# Patient Record
Sex: Male | Born: 1960
Health system: Southern US, Community
[De-identification: ages and names within clinical notes are randomized; demographics above are authoritative.]

## PROBLEM LIST (undated history)

## (undated) DIAGNOSIS — N200 Calculus of kidney: Secondary | ICD-10-CM

## (undated) DIAGNOSIS — S83289A Other tear of lateral meniscus, current injury, unspecified knee, initial encounter: Secondary | ICD-10-CM

## (undated) DIAGNOSIS — S83249A Other tear of medial meniscus, current injury, unspecified knee, initial encounter: Secondary | ICD-10-CM

## (undated) DIAGNOSIS — I10 Essential (primary) hypertension: Secondary | ICD-10-CM

## (undated) DIAGNOSIS — G4733 Obstructive sleep apnea (adult) (pediatric): Secondary | ICD-10-CM

## (undated) DIAGNOSIS — M1990 Unspecified osteoarthritis, unspecified site: Secondary | ICD-10-CM

## (undated) DIAGNOSIS — C61 Malignant neoplasm of prostate: Secondary | ICD-10-CM

## (undated) DIAGNOSIS — J449 Chronic obstructive pulmonary disease, unspecified: Secondary | ICD-10-CM

## (undated) DIAGNOSIS — R0602 Shortness of breath: Secondary | ICD-10-CM

## (undated) DIAGNOSIS — E041 Nontoxic single thyroid nodule: Secondary | ICD-10-CM

## (undated) DIAGNOSIS — J45909 Unspecified asthma, uncomplicated: Secondary | ICD-10-CM

## (undated) DIAGNOSIS — K219 Gastro-esophageal reflux disease without esophagitis: Secondary | ICD-10-CM

## (undated) HISTORY — DX: Other tear of medial meniscus, current injury, unspecified knee, initial encounter: S83.249A

## (undated) HISTORY — PX: BACK SURGERY: SHX140

## (undated) HISTORY — DX: Essential (primary) hypertension: I10

## (undated) HISTORY — DX: Other tear of lateral meniscus, current injury, unspecified knee, initial encounter: S83.289A

## (undated) HISTORY — DX: Unspecified asthma, uncomplicated: J45.909

---

## 1988-11-01 HISTORY — PX: LUMBAR DISC SURGERY: SHX700

## 2000-10-12 ENCOUNTER — Ambulatory Visit (HOSPITAL_COMMUNITY): Admission: RE | Admit: 2000-10-12 | Discharge: 2000-10-12 | Payer: Self-pay | Admitting: Pulmonary Disease

## 2001-05-31 ENCOUNTER — Encounter: Payer: Self-pay | Admitting: Otolaryngology

## 2001-05-31 ENCOUNTER — Ambulatory Visit (HOSPITAL_COMMUNITY): Admission: RE | Admit: 2001-05-31 | Discharge: 2001-05-31 | Payer: Self-pay | Admitting: Otolaryngology

## 2001-07-19 ENCOUNTER — Ambulatory Visit (HOSPITAL_COMMUNITY): Admission: RE | Admit: 2001-07-19 | Discharge: 2001-07-19 | Payer: Self-pay | Admitting: Neurology

## 2001-07-19 ENCOUNTER — Encounter: Payer: Self-pay | Admitting: Neurology

## 2004-03-03 HISTORY — PX: ROBOT ASSISTED LAPAROSCOPIC RADICAL PROSTATECTOMY: SHX5141

## 2004-09-20 ENCOUNTER — Ambulatory Visit: Admission: RE | Admit: 2004-09-20 | Discharge: 2004-11-19 | Payer: Self-pay | Admitting: Radiation Oncology

## 2004-12-20 ENCOUNTER — Encounter (INDEPENDENT_AMBULATORY_CARE_PROVIDER_SITE_OTHER): Payer: Self-pay | Admitting: Specialist

## 2004-12-20 ENCOUNTER — Inpatient Hospital Stay (HOSPITAL_COMMUNITY): Admission: RE | Admit: 2004-12-20 | Discharge: 2004-12-23 | Payer: Self-pay | Admitting: Urology

## 2008-10-25 ENCOUNTER — Ambulatory Visit (HOSPITAL_COMMUNITY): Admission: RE | Admit: 2008-10-25 | Discharge: 2008-10-25 | Payer: Self-pay | Admitting: Internal Medicine

## 2009-01-01 HISTORY — PX: NASAL SINUS SURGERY: SHX719

## 2009-01-15 ENCOUNTER — Ambulatory Visit (HOSPITAL_COMMUNITY): Admission: RE | Admit: 2009-01-15 | Discharge: 2009-01-15 | Payer: Self-pay | Admitting: Otolaryngology

## 2009-01-15 ENCOUNTER — Encounter (INDEPENDENT_AMBULATORY_CARE_PROVIDER_SITE_OTHER): Payer: Self-pay | Admitting: Otolaryngology

## 2009-09-19 ENCOUNTER — Ambulatory Visit (HOSPITAL_COMMUNITY): Admission: RE | Admit: 2009-09-19 | Discharge: 2009-09-19 | Payer: Self-pay | Admitting: Internal Medicine

## 2009-09-25 DIAGNOSIS — I10 Essential (primary) hypertension: Secondary | ICD-10-CM | POA: Insufficient documentation

## 2009-09-26 ENCOUNTER — Ambulatory Visit: Payer: Self-pay | Admitting: Critical Care Medicine

## 2009-09-26 DIAGNOSIS — R0602 Shortness of breath: Secondary | ICD-10-CM | POA: Insufficient documentation

## 2009-09-26 DIAGNOSIS — G4710 Hypersomnia, unspecified: Secondary | ICD-10-CM | POA: Insufficient documentation

## 2009-09-26 DIAGNOSIS — Z8546 Personal history of malignant neoplasm of prostate: Secondary | ICD-10-CM | POA: Insufficient documentation

## 2009-10-31 ENCOUNTER — Ambulatory Visit (HOSPITAL_BASED_OUTPATIENT_CLINIC_OR_DEPARTMENT_OTHER): Admission: RE | Admit: 2009-10-31 | Discharge: 2009-10-31 | Payer: Self-pay | Admitting: Critical Care Medicine

## 2009-10-31 ENCOUNTER — Encounter: Payer: Self-pay | Admitting: Critical Care Medicine

## 2009-11-12 ENCOUNTER — Ambulatory Visit: Payer: Self-pay | Admitting: Pulmonary Disease

## 2009-11-28 ENCOUNTER — Ambulatory Visit: Payer: Self-pay | Admitting: Pulmonary Disease

## 2009-11-29 DIAGNOSIS — G4730 Sleep apnea, unspecified: Secondary | ICD-10-CM | POA: Insufficient documentation

## 2009-12-15 ENCOUNTER — Encounter: Payer: Self-pay | Admitting: Pulmonary Disease

## 2009-12-28 ENCOUNTER — Ambulatory Visit: Payer: Self-pay | Admitting: Pulmonary Disease

## 2010-01-02 ENCOUNTER — Encounter: Payer: Self-pay | Admitting: Pulmonary Disease

## 2010-01-27 ENCOUNTER — Encounter: Payer: Self-pay | Admitting: Pulmonary Disease

## 2010-03-08 ENCOUNTER — Ambulatory Visit
Admission: RE | Admit: 2010-03-08 | Discharge: 2010-03-08 | Payer: Self-pay | Source: Home / Self Care | Attending: Pulmonary Disease | Admitting: Pulmonary Disease

## 2010-04-04 NOTE — Assessment & Plan Note (Signed)
Summary: Pulmonary Consultation   Copy to:  Dr. Carylon Perches Primary Provider/Referring Provider:  Dr. Carylon Perches  CC:  Pulmonary Consult for SOB.   and dyspnea.  History of Present Illness: Pulmonary Consultation       This is a 50 year old male who presents with dyspnea.  The patient complains of shortness of breath, cough, mucous production, and nocturnal awakening, but denies history of diagnosed COPD, chest tightness, chest pain worse with breathing and coughing, wheezing, exercise induced symptoms, and congestion.  The cough is described as insignificant, perceived to be from upper airway, does not interrupt sleep, and does not occur after eating.  The dyspnea is described as dyspnea at rest, dyspnea with exertion, worse with inspiration, and occurs while bending over.  The patient reports a history of cancer:.  The patient denies any history of asthma, allergic rhinitis, COPD, sleep disordered breathing, obstructive sleep apnea, heart disease, thyroid disease, anemia, collagen vascular disease, osteporosis, kyphoscoliosis, occupational exposure: , and cancer: .    This pt is referred for eval for dyspnea.   The symptoms started around Feb of 2011.  The pt was  on lisinopril. PCP   ? if was ACE inhibitor side effect.   ACE inhibitor was stopped and pt is still dyspneic and cxr and exam not revealing cause.   PT referred for further eval. Only issue is dyspnea, will cough in early am and gets better through day.  No nocturnal  coughing.  The pt will awaken from sleep dyspneic,   ? sleep study is in order.   Labs show BNP normal.   Ddimer is normal.    Preventive Screening-Counseling & Management  Alcohol-Tobacco     Smoking Status: quit > 6 months     Year Quit: 1995     Pack years: 30  Pulmonary Function Test Date: 09/26/2009 Height (in.): 69 Gender: Male  Pre-Spirometry FVC    Value: 4.54 L/min   Pred: 4.75 L/min     % Pred: 96 % FEV1    Value: 3.27 L     Pred: 3.52 L     % Pred:  93 % FEV1/FVC  Value: 72 %     Pred: 74 %    FEF 25-75  Value: 2.42 L/min   Pred: 3.55 L/min     % Pred: 68 %    What time do you typically go to bed?(between what hours): 11pm  How long does it take you to fall asleep? Will go to sleep immediately  How many times during the night do you wake up? 4-5 times per night  What time do you get out of bed to start your day? 6-7 am  Do you drive or operate heavy machinery in your occupation? unemployed  How much has your weight changed (up or down) over the past two years? (in pounds): 10#  Have you ever had a sleep study before?  If yes,when and where: no  Do you currently use CPAP ? If so , at what pressure? no  Do you wear oxygen at any time? If yes, how many liters per minute? no Current Medications (verified): 1)  Losartan Potassium 50 Mg Tabs (Losartan Potassium) .Marland Kitchen.. 1 By Mouth Daily 2)  Naprosyn 500 Mg Tabs (Naproxen) .... One By Mouth Daily As Needed  Allergies (verified): No Known Drug Allergies  Past History:  Past medical, surgical, family and social histories (including risk factors) reviewed, and no changes noted (except as noted below).  Past  Medical History: PROSTATE CANCER, HX OF (ICD-V10.46) HYPERTENSION (ICD-401.9)  Past Surgical History: sinus surgery 01/2009 prostate surgery - 2006 back surgery 1990's  Family History: Reviewed history and no changes required. heart attack-father anuerysm-mother  Social History: Reviewed history and no changes required. Patient states former smoker.  Smoked 15 yrs.  Up to 2 1/2 - 3ppd when he stopped smoking.  Quit in 1995. 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  The patient denies coughing up blood, irregular heartbeats, acid heartburn, indigestion, loss of appetite, abdominal pain, sore throat, tooth/dental problems, headaches, itching, ear ache, anxiety, hand/feet swelling, change in color of mucus, and fever.        See HPI for Pulmonary, General, Cardiac, and ENT review of systems.  Vital Signs:  Patient profile:   50 year old male Height:      69 inches Weight:      261.38 pounds BMI:     38.74 O2 Sat:      97 % on Room air Temp:     98.4 degrees F oral Pulse rate:   96 / minute BP sitting:   160 / 84  (left arm) Cuff size:   large  Vitals Entered By: Gweneth Dimitri RN (September 26, 2009 3:14 PM)  O2 Flow:  Room air CC: Pulmonary Consult for SOB.  , dyspnea Comments Medications reviewed with patient Daytime contact number verified with patient. Gweneth Dimitri RN  September 26, 2009 3:14 PM    Physical Exam  Additional Exam:  Gen: Pleasant, well-nourished, in no distress,  normal affect ENT: No lesions,  mouth clear,  oropharynx clear, no postnasal drip Neck: No JVD, no TMG, no carotid bruits Lungs: No use of accessory muscles, no dullness to percussion, clear without rales or rhonchi, distant BS Cardiovascular: RRR, heart sounds normal, no murmur or gallops, no peripheral edema Abdomen: soft and NT, no HSM,  BS normal Musculoskeletal: No deformities, no cyanosis or clubbing Neuro: alert, non focal Skin: Warm, no lesions or rashes    CXR  Procedure date:  09/19/2009  Findings:      persistent bronchitic changes, else normal study  Pulmonary Function Test Date: 09/26/2009 Height (in.): 69 Gender: Male  Pre-Spirometry FVC    Value: 4.54 L/min   Pred: 4.75 L/min     % Pred: 96 % FEV1    Value: 3.27 L     Pred: 3.52 L     % Pred: 93 % FEV1/FVC  Value: 72 %     Pred: 74 %    FEF 25-75  Value: 2.42 L/min   Pred: 3.55  L/min     % Pred: 68 %  Impression & Recommendations:  Problem # 1:  DYSPNEA (ICD-786.05) Assessment Unchanged Dyspnea ? etiology,  spiro shows obstruction in peripheral airways.  ? asthma ? hyperreactive airways  ? sleep apnea component, weight gain is an issue, also needs reflux diet to control acid into upper airway and promote weight loss  plan Start Asmanex two puff daily Use ventolin hfa one - two puffs every 4 hours as needed A sleep study will be obtained Need to  focus on weight loss, see reflux diet sheet for this Return in one month for recheck Orders: Spirometry w/Graph (94010) New Patient Level V (04540)  Medications Added to Medication List This Visit: 1)  Niaspan 500 Mg Cr-tabs (Niacin (antihyperlipidemic)) .... As needed 2)  Naprosyn 500 Mg Tabs (Naproxen) .... One by mouth daily as needed 3)  Asmanex 120 Metered Doses 220 Mcg/inh Aepb (Mometasone furoate) .... Two puffs nightly 4)  Ventolin Hfa 108 (90 Base) Mcg/act Aers (Albuterol sulfate) .... One to two puffs every 4 hours as needed  Complete Medication List: 1)  Losartan Potassium 50 Mg Tabs (Losartan potassium) .Marland Kitchen.. 1 by mouth daily 2)  Naprosyn 500 Mg Tabs (Naproxen) .... One by mouth daily as needed 3)  Asmanex 120 Metered Doses 220 Mcg/inh Aepb (Mometasone furoate) .... Two puffs nightly 4)  Ventolin Hfa 108 (90 Base) Mcg/act Aers (Albuterol sulfate) .... One to two puffs every 4 hours as needed  Other Orders: Sleep Disorder Referral (Sleep Disorder) Sleep Disorder Referral (Sleep Disorder)  Patient Instructions: 1)  Start Asmanex two puff daily 2)  Use ventolin hfa one - two puffs every 4 hours as needed 3)  A sleep study will be obtained 4)  Need to focus on weight loss, see reflux diet sheet for this 5)  Return in one month for recheck Prescriptions: VENTOLIN HFA 108 (90 BASE) MCG/ACT AERS (ALBUTEROL SULFATE) one to two puffs every 4 hours as needed  #1 x 6   Entered and Authorized by:   Storm Frisk MD   Signed by:   Storm Frisk MD on 09/26/2009   Method used:   Electronically to        [facility] [address] [phone]* (retail)       [address]       [place], [place]  [address]       Ph: [phone] or [phone]       Fax: [phone]   RxID:   [id number] ASMANEX 120 METERED DOSES 220 MCG/INH  AEPB (MOMETASONE FUROATE) two puffs nightly  #1 x 6   Entered and Authorized by:   Storm Frisk MD   Signed by:   Storm Frisk MD on 09/26/2009   Method used:   [facility] to        [facility] [phone][facility] (retail)       [address]       [place], [place]  [address]       Ph: [phone] or [phone]       Fax: [phone]   RxID:   [id number]      CardioPerfect Spirometry  ID: [id number] Patient: Thomas [name] A DOB: 1960-08-25 Age: 50 Years Old Sex: Male Race: White Physician: Dr. Delford Field Height: 69 Weight: 261.38 Past Medical History:  Current Problems:  HYPERTENSION (ICD-401.9)  Recorded: 09/26/2009  Parameter  Measured Predicted %Predicted FVC     4.54        96 FEV1     3.27        3.52        93 FEV1%   72                 PEF                      Interpretation:   Appended Document: Pulmonary Consultation fax Carylon Perches  Appended Document: Pulmonary Consultation pl make post sleep study FU OV with me   Appended Document: Pulmonary Consultation LMOMTCB x 1 and schedule an appointment. jwr  Appended Document: Pulmonary Consultation pt scheduled appointment.jwr

## 2010-04-04 NOTE — Assessment & Plan Note (Signed)
Summary: 3 mo follow-up/LC   Visit Type:  Follow-up Copy to:  Dr. Carylon Perches Primary Provider/Referring Provider:  Dr. Carylon Perches  CC:  Pt here for 3 month follow up on OSA.  History of Present Illness: Pulmonary Consultation 09/26/05  49/M, ex smoker, post sinus surgery with persistent dyspnea, near nml pfts. Labs show BNP normal.   Ddimer is normal.  November 28, 2009 12:12 PM SLEEP CONSULT -smoker,  referred for evaluation of obstructive sleep apnea , his chief complaint is dyspnea, nml spirometry Epworth Sleepiness Score 8/24  Reviewed pSG on 10/31/09, BMI 37 ,wt 250 >> poor sleep efficiency with TST only 2 h, AHI 26/h, RDI 36/h, lowest desatn 88%, desensitized with a medium full face mask  December 28, 2009 11:16 AM  reviewed download 10/3-15/11 >> poor compliance 2.5 hrs , avg pr 12 cm, AHI 2.5/h - good ocntrol of events. he changed to full face mask, has paranasal leak Some improvement in tiredness, more energy  March 08, 2010 3:16 PM  download 10/28-11/27/11 >> excellent compliance, no residual events cpap helps with tiredness & dyspnea but main problem is positional neck pain , c/o slipped disc in neck,sleeps on his stomach. He is a mouth breather & does not want to try nasal mask.    Preventive Screening-Counseling & Management  Alcohol-Tobacco     Alcohol drinks/day: 1     Alcohol type: beer     Smoking Status: quit > 6 months     Packs/Day: 0.5     Year Started: 1976     Year Quit: 1995     Pack years: 30  Current Medications (verified): 1)  Naprosyn 500 Mg Tabs (Naproxen) .... One By Mouth Daily As Needed 2)  Asmanex 120 Metered Doses 220 Mcg/inh  Aepb (Mometasone Furoate) .... Two Puffs Nightly As Needed 3)  Ventolin Hfa 108 (90 Base) Mcg/act Aers (Albuterol Sulfate) .... One To Two Puffs Every 4 Hours As Needed 4)  Benicar 20 Mg Tabs (Olmesartan Medoxomil) .... Take 1 Tablet By Mouth Once A Day  Allergies (verified): No Known Drug Allergies  Past  History:  Past Medical History: Last updated: 09/26/2009 PROSTATE CANCER, HX OF (ICD-V10.46) HYPERTENSION (ICD-401.9)  Social History: Last updated: 09/26/2009 Patient states former smoker.  Smoked 15 yrs.  Up to 2 1/2 - 3ppd when he stopped smoking.  Quit in 1995. 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    Vital Signs:  Patient profile:   50 year old male Height:      69 inches Weight:      263 pounds BMI:     38.98 O2 Sat:      99 % on Room air Temp:     98.7 degrees F oral Pulse rate:   94 / minute BP sitting:   122 / 82  (left arm) Cuff size:   large  Vitals Entered By: Zackery Barefoot CMA (March 08, 2010 2:48 PM)  O2 Flow:  Room air CC: Pt here for 3 month follow up on OSA Comments Medications reviewed with patient Verified contact number and pharmacy with patient Shanda Bumps  [facility]  March 08, 2010 2:49 PM    Physical Exam  Additional Exam:  wt 262 December 28, 2009 263 March 08, 2010  Gen: Pleasant, well-nourished, in no distress,  normal affect ENT: No lesions,  mouth clear,  oropharynx clear, no postnasal drip, class 3 airway Neck: No JVD, no TMG, no carotid bruits Lungs: No use of accessory muscles, no dullness to percussion, clear without rales or rhonchi, distant BS Cardiovascular: RRR, heart sounds normal, no murmur or gallops, no peripheral edema Musculoskeletal: No deformities, no cyanosis or clubbing Skin: Warm, no  lesions or rashes    Impression & Recommendations:  Problem # 1:  OBSTRUCTIVE SLEEP APNEA (ICD-780.57)  We discussed his positional problems in some detail Options include - conformational pillow and / or nasal interface -mask or pillows He will tryt his sequentially & see benefit. Otherwise he has experienced the benefits of cpap so is agreeable to be complaint. Compliance encouraged, wt loss emphasized, asked to avoid meds with sedative side effects, cautioned against driving when sleepy.  Finally, if above does not work, consider oral appliance?  Orders: Est. Patient Level III (13244)  Patient Instructions: 1)  Copy sent to: dr fagan 2)  Please schedule a follow-up appointment in 6 months. 3)  Options for your problems include: 4)  conformational pillow 5)  Nasal pillows

## 2010-04-04 NOTE — Assessment & Plan Note (Signed)
Summary: 1 month/ mbw   Visit Type:  Follow-up Copy to:  Dr. Carylon Perches Primary Daron Breeding/Referring Wen [place]:  Dr. Carylon Perches  CC:  1 month follow up.  History of Present Illness: Pulmonary Consultation 09/26/05  50/M, ex smoker, post sinus surgery with persistent dyspnea, near nml pfts. Labs show BNP normal.   Ddimer is normal.  November 28, 2009 12:12 PM SLEEP CONSULT -smoker,  referred for evaluation of obstructive sleep apnea , his chief complaint is dyspnea, nml spirometry Epworth Sleepiness Score 8/24 Bdetime 11 pm, latency 10-90 mins due to back & neck pain on stomach x 1 pillow, 4-5 awakenings, oob at 0700, dry mouth, tired, wife noted loud snoring,  c/o witnessed apneas, no daytime naps,  Reviewed pSG on 10/31/09, BMI 37 ,wt 250 >> poor sleep efficiency with TST only 2 h, AHI 26/h, RDI 36/h, lowest desatn 88%, desensitized with a medium full face mask  December 28, 2009 11:16 AM  reviewed download 10/3-15/11 >> poor compliance 2.5 hrs , avg pr 12 cm, AHI 2.5/h - good ocntrol of events. he changed to full face mask, has paranasal leak Some improvement in tiredness, more energy   Preventive Screening-Counseling & Management  Alcohol-Tobacco     Alcohol drinks/day: 1     Alcohol type: beer     Smoking Status: quit > 6 months     Packs/Day: 0.5     Year Started: 1976     Year Quit: 1995     Pack years: 30  Current Medications (verified): 1)  Naprosyn 500 Mg Tabs (Naproxen) .... One By Mouth Daily As Needed 2)  Asmanex 120 Metered Doses 220 Mcg/inh  Aepb (Mometasone Furoate) .... Two Puffs Nightly As Needed 3)  Ventolin Hfa 108 (90 Base) Mcg/act Aers (Albuterol Sulfate) .... One To Two Puffs Every 4 Hours As Needed 4)  Benicar 20 Mg Tabs (Olmesartan Medoxomil) .... Take 1 Tablet By Mouth Once A Day  Allergies (verified): No Known Drug Allergies  Past History:  Past Medical History: Last updated: 09/26/2009 PROSTATE CANCER, HX OF (ICD-V10.46) HYPERTENSION  (ICD-401.9)  Social History: Last updated: 09/26/2009 Patient states former smoker.  Smoked 15 yrs.  Up to 2 1/2 - 3ppd when he stopped smoking.  Quit in 1995. 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    Vital Signs:  Patient profile:   50 year old male Height:      69 inches Weight:      262.0 pounds BMI:     38.83 O2 Sat:      95 % on Room air Temp:     98.6 degrees F oral Pulse rate:   86 / minute BP sitting:   100 / 70  (left arm) Cuff size:   large  Vitals Entered By: Zackery Barefoot CMA (December 28, 2009 10:58 AM)  O2 Flow:  Room air CC: 1 month follow up Comments Medications reviewed with patient Verified contact number and pharmacy with patient Zackery Barefoot [facility]  December 28, 2009 10:58 AM    Physical Exam  Additional Exam:  wt 262 December 28, 2009  Gen: Pleasant, well-nourished, in no distress,  normal affect ENT: No lesions,  mouth clear,  oropharynx clear, no postnasal drip, class 3 airway Neck: No JVD, no TMG, no carotid bruits Lungs: No use of accessory muscles, no dullness to percussion, clear without rales or rhonchi, distant BS Cardiovascular: RRR, heart sounds normal, no murmur or gallops, no peripheral edema Musculoskeletal: No deformities, no cyanosis or clubbing Skin: Warm, no lesions or rashes    Impression & Recommendations:  Problem # 1:  OBSTRUCTIVE SLEEP APNEA (ICD-780.57) The pathophysiology of obstructive sleep apnea, it's  cardiovascular consequences and modes of treatment including CPAP were discussed with the patient in great detail.  Compliance encouraged, wt loss emphasized, asked to avoid meds with sedative side effects, cautioned against driving when sleepy. change to cpap 12 cm, rechk download in 4 wks Orders: Est. Patient Level III (16109) DME Referral (DME)  Medications Added to Medication List This Visit: 1)  Benicar 20 Mg Tabs (Olmesartan medoxomil) .... Take 1 tablet by mouth once a day  Patient Instructions: 1)  Copy sent to: dr fagan 2)  Please schedule a follow-up appointment in 3 months. 3)  I will change to pressure of 12 cm - download in 4 weeks    Not Administered:    Influenza Vaccine not given due to: declined   Appended Document: 1 month/ mbw download 10/28-11/27/11 >> excellent compliance, no residual events  Appended Document: 1 month/ mbw lmomtcb x 1.   Appended Document: 1 month/ mbw Pt is aware of cpap compliance and scheduled 3 month follow-up for 03/08/2010 w/ RA.

## 2010-04-04 NOTE — Assessment & Plan Note (Signed)
Summary: post sleep study f/u ov per RA/apc   Visit Type:  Follow-up Copy to:  Dr. Carylon Perches Primary Provider/Referring Provider:  Dr. Carylon Perches  CC:  Pt here for sleep follow up.  History of Present Illness: Pulmonary Consultation 09/26/05   This pt is referred for eval for dyspnea.   The symptoms started around Feb of 2011.  The pt was  on lisinopril. PCP   ? if was ACE inhibitor side effect.   ACE inhibitor was stopped and pt is still dyspneic and cxr and exam not revealing cause.   PT referred for further eval. Only issue is dyspnea, will cough in early am and gets better through day.  No nocturnal  coughing.  The pt will awaken from sleep dyspneic,   ? sleep study is in order.   Labs show BNP normal.   Ddimer is normal.  November 28, 2009 12:12 PM SLEEP CONSULT -smoker,  referred for evaluation of obstructive sleep apnea , his chief complaint is dyspnea, nml spirometry Epworth Sleepiness Score 8/24 Bdetime 11 pm, latency 10-90 mins due to back & neck pain on stomach x 1 pillow, 4-5 awakenings, oob at 0700, dry mouth, tired, wife noted loud snoring,  c/o witnessed apneas, no daytime naps,  There is no history suggestive of cataplexy, sleep paralysis or parasomnias  Reviewed pSG on 10/31/09, BMI 37 ,wt 250 >> poor sleep efficiency with TST only 2 h, AHI 26/h, RDI 36/h, lowest desatn 88%, desensitized with a medium full face mask   Preventive Screening-Counseling & Management  Alcohol-Tobacco     Alcohol drinks/day: 1     Alcohol type: beer     Smoking Status: quit > 6 months     Packs/Day: 0.5     Year Started: 1976     Year Quit: 1995     Pack years: 30   Current Medications (verified): 1)  Naprosyn 500 Mg Tabs (Naproxen) .... One By Mouth Daily As Needed 2)  Asmanex 120 Metered Doses 220 Mcg/inh  Aepb (Mometasone Furoate) .... Two Puffs Nightly As Needed 3)  Ventolin Hfa 108 (90 Base) Mcg/act Aers (Albuterol Sulfate) .... One To Two Puffs Every 4 Hours As  Needed  Allergies (verified): No Known Drug Allergies  Past History:  Past Medical History: Last updated: 09/26/2009 PROSTATE CANCER, HX OF (ICD-V10.46) HYPERTENSION (ICD-401.9)  Past Surgical History: Last updated: 09/26/2009 sinus surgery 01/2009 prostate surgery - 2006 back surgery 1990's  Family History: Last updated: 09/26/2009 heart attack-father anuerysm-mother  Social History: Last updated: 09/26/2009 Patient states former smoker.  Smoked 15 yrs.  Up to 2 1/2 - 3ppd when he stopped smoking.  Quit in 1995. 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  The patient denies anorexia, fever, weight loss, weight gain, vision loss, decreased hearing, hoarseness, chest pain, syncope, peripheral edema, prolonged cough, headaches, hemoptysis, abdominal pain, melena, hematochezia, severe indigestion/heartburn, hematuria, muscle weakness, suspicious skin lesions, difficulty walking, depression, unusual weight change, abnormal bleeding, enlarged lymph nodes, and angioedema.    Vital Signs:  Patient profile:   50 year old male Height:      69 inches Weight:      259 pounds BMI:     38.39 O2 Sat:      95 % on Room air Temp:  98.7 degrees F oral Pulse rate:   92 / minute BP sitting:   110 / 78  (left arm) Cuff size:   large  Vitals Entered By: Zackery Barefoot CMA (November 28, 2009 11:55 AM)  O2 Flow:  Room air CC: Pt here for sleep follow up Comments Medications reviewed with patient Verified contact number and pharmacy with patient Zackery Barefoot CMA  November 28, 2009 11:57 AM    Physical Exam  Additional Exam:  Gen: Pleasant, well-nourished, in no distress,  normal affect ENT: No lesions,  mouth clear,  oropharynx clear, no postnasal drip, class 3 airway Neck: No JVD, no TMG,  no carotid bruits Lungs: No use of accessory muscles, no dullness to percussion, clear without rales or rhonchi, distant BS Cardiovascular: RRR, heart sounds normal, no murmur or gallops, no peripheral edema Abdomen: soft and NT, no HSM,  BS normal Musculoskeletal: No deformities, no cyanosis or clubbing Neuro: alert, non focal Skin: Warm, no lesions or rashes    Impression & Recommendations:  Problem # 1:  OBSTRUCTIVE SLEEP APNEA (ICD-780.57)  Moderate - severe based on RDi although desaturations not so severe. Poor sleep efficiency could be first night effect or related to his pain & frequent awakenings. The pathophysiology of obstructive sleep apnea, it's cardiovascular consequences and modes of treatment including CPAP were discussed with the patient in great detail.  Proceed with autoCPAP 8-15 with mask of choice based on desensitization in sleep lab. He is keen on finding a reason for his dyspnea - I explained that obstructive sleep apnea may not be directly related.  Orders: Est. Patient Level IV (16109) DME Referral (DME)  Medications Added to Medication List This Visit: 1)  Asmanex 120 Metered Doses 220 Mcg/inh Aepb (Mometasone furoate) .... Two puffs nightly as needed  Patient Instructions: 1)  Copy sent to: dr Delford Field  2)  Please schedule a follow-up appointment in 1 month. 3)  GO ove rtothe slep lab [phone] for mask fit  Appended Document: post sleep study f/u ov per RA/apc desensitized wiht a full face resmed quattro mask at sleep lab

## 2010-04-04 NOTE — Letter (Signed)
Summary: CMN for CPAP Supplies/[facility]  CMN for CPAP Supplies/[facility]   Imported By: Sherian Rein 01/08/2010 09:19:06  _____________________________________________________________________  External Attachment:    Type:   Image     Comment:   External Document

## 2010-04-12 ENCOUNTER — Telehealth (INDEPENDENT_AMBULATORY_CARE_PROVIDER_SITE_OTHER): Payer: Self-pay | Admitting: *Deleted

## 2010-04-18 NOTE — Progress Notes (Signed)
Summary: Pillow vs. nasal pillows  Phone Note Call from Patient Call back at Home Phone [phone]   Caller: Patient Call For: alva Summary of Call: Patient got conformatory pillow.  Not going well and would like to try nasal pillow.  Needs prescription. Initial call taken by: Leonette Monarch,  April 12, 2010 1:17 PM  Follow-up for Phone Call        Pt says he is not sleeping any better using the conformatory pillow.  He states he may even be sleeping less.  He would like to try the nasal pillows with cpap. Pls advise.Michel Bickers [facility]  April 12, 2010 2:11 PM  Rx sent Follow-up by: Comer Locket. Vassie Loll MD,  April 12, 2010 3:13 PM  Additional Follow-up for Phone Call Additional follow up Details #1::        Pt aware order sent for nasal pillows.  He verbalized understanding. Additional Follow-up by: Gweneth Dimitri RN,  April 12, 2010 3:16 PM

## 2010-05-07 ENCOUNTER — Encounter: Payer: Self-pay | Admitting: Adult Health

## 2010-05-07 ENCOUNTER — Encounter (INDEPENDENT_AMBULATORY_CARE_PROVIDER_SITE_OTHER): Payer: 59

## 2010-05-07 DIAGNOSIS — R0602 Shortness of breath: Secondary | ICD-10-CM

## 2010-06-05 LAB — BASIC METABOLIC PANEL
BUN: 11 mg/dL (ref 6–23)
Calcium: 9.5 mg/dL (ref 8.4–10.5)
GFR calc non Af Amer: >60 mL/min (ref >60)
Glucose, Bld: 97 mg/dL (ref 70–99)
Potassium: 4.2 mEq/L (ref 3.5–5.1)

## 2010-06-05 LAB — CBC
HCT: 46.1 % (ref 39.0–52.0)
Platelets: 236 10*3/uL (ref 150–400)
RDW: 13.5 % (ref 11.5–15.5)

## 2010-07-19 NOTE — Discharge Summary (Signed)
Thomas [name], Thomas [name]              ACCOUNT NO.:  [id number]   MEDICAL RECORD NO.:  [id number]          PATIENT TYPE:  INP   LOCATION:  1432                         FACILITY:  [facility]   PHYSICIAN:  Ronald L. Earlene Plater, M.D.  DATE OF BIRTH:  02/25/1961   DATE OF ADMISSION:  12/20/2004  DATE OF DISCHARGE:  12/23/2004                                 DISCHARGE SUMMARY   ADMISSION DIAGNOSES:  Adenocarcinoma of the prostate.   DISCHARGE DIAGNOSES:  Status post robotic assisted laparoscopic  prostatectomy.   BRIEF HISTORY:  A 50 year old male diagnosed with clinical T1C prostate  cancer. Preoperative PSA of 8.8 and Gleason score of 3+3=6. After discussion  of all possible options, the patient elected to proceed accordingly.   PAST MEDICAL HISTORY:  Hypertension, GERD, psoriasis.   PAST SURGICAL HISTORY:  Repair of ruptured lumbar disk in 1991.   ALLERGIES:  None known.   HOSPITAL COURSE:  On December 20, 2004, Mr. Crehan was electively admitted to  [facility] in the care of Dr. Darvin Neighbours. He underwent  the following procedure, robotic assisted laparoscopic prostatectomy. He  tolerated the procedure well, transferring in stable condition to the  recovery room. He was extubated and immediately awoke from surgery  neurologically intact.   Postoperative course fairly uncomfortable. Temperature max 100 postoperative  day 2 and was hypertensive with systolic 141/60 and diastolic 99 to 100.  Foley patent and intact. JP output initially 195 mL, however, decreased  significantly to less than 60 mL per shift.   The patient ambulated well. Abdomen remained soft, nondistended, positive  bowel sounds. The patient was discharged home with a Foley on December 23, 2004.   LABORATORY DATA:  On December 20, 2004, white blood count 13.7, hemoglobin  13.2, hematocrit 37.5. Sodium 131, potassium 4.0, BUN 11, creatinine 1.4. On  December 21, 2004, white blood cell count 8.7, hemoglobin 12,  hematocrit  33.8, folate was __________. Sodium 33, potassium 3.6, BUN 10, creatinine  1.4.   PATHOLOGY:  Prostatic adenocarcinoma Gleason score 3+4=7 with focal margin  involvement in the right posterior prostate, urethral margin benign,  fibromuscular connective tissue. Seminal vesicle was free of tumor.   CONDITION ON DISCHARGE:  Improved.   DISCHARGE MEDICATIONS:  1.  Naprosyn.  2.  Vicodin as needed.  3.  Colace over the counter.   ACTIVITY:  No strenuous activity or lifting greater than 10 pounds, no  driving a car, limit long car rides and no severe straining during bowel  movement.   DIET:  As tolerated.   WOUND CARE:  Keep incision clean and dry and may shower. Leg bag  instructions given for the catheter care.   FOLLOW UP:  He is to return in 1 week to see Dr. Darvin Neighbours.     ______________________________  Alessandra Bevels. Chase Picket, FNP-C      Ronald L. Earlene Plater, M.D.  Electronically Signed    JML/MEDQ  D:  01/08/2005  T:  01/09/2005  Job:  [id number]

## 2010-07-19 NOTE — Op Note (Signed)
Thomas [name], Thomas [name]              ACCOUNT NO.:  [id number]   MEDICAL RECORD NO.:  [id number]          PATIENT TYPE:  INP   LOCATION:  0002                         FACILITY:  [facility]   PHYSICIAN:  Lucrezia Starch. Earlene Plater, M.D.  DATE OF BIRTH:  1960-08-08   DATE OF PROCEDURE:  12/20/2004  DATE OF DISCHARGE:                                 OPERATIVE REPORT   PREOPERATIVE DIAGNOSIS:  Clinically localized adenocarcinoma of the  prostate.   POSTOPERATIVE DIAGNOSIS:  Clinically localized adenocarcinoma of the  prostate.   PROCEDURE:  Robotic-assisted radical prostatectomy.   SURGEON:  Lucrezia Starch. Earlene Plater, M.D.   ASSISTANTS:  Heloise Purpura, M,D., Glade Nurse, M.D.   ANESTHESIA:  General.   COMPLICATIONS:  None.   ESTIMATED BLOOD LOSS:  600 cc.   IV FLUIDS:  3100 cc of crystalloid.   SPECIMENS:  1.  Prostate and seminal vesicles.  2.  Urethral margin.   INDICATIONS:  Mr. Mccuiston is a 50 year old gentleman with recently diagnosed  clinical T1C prostate cancer with a preoperative PSA of 8.8 and a Gleason  score of 3+3=6.  After a discussion of all possible management options for  his clinically localized prostate cancer, the patient elected to proceed  with the above procedures.  Potential risks and benefits of this procedure  were discussed with the patient, and he consented.   DESCRIPTION OF PROCEDURE:  The patient was taken to the operating room, and  a general anesthetic was administered.  The patient was given preoperative  antibiotics, placed in the dorsal lithotomy position, and prepped and draped  in the usual sterile fashion.  After being identified by his bracelet, a  preoperative time out was performed.  A Foley catheter was inserted into the  bladder, and a 1 cm vertical incision was made 18 cm from the pubic  symphysis just to the left of the umbilicus.  This was carried down through  the subcutaneous fat with electrocautery and the underlying rectus sheath  was incised,  thereby exposing the rectus muscle, which was spread to either  side of this incision.  The posterior rectus sheath and peritoneum was then  sharply incised with a knife, allowing entry directly into the peritoneal  cavity.  A 12 mm port was placed, and the abdomen was insufflated.  The  remaining ports were then placed under direct vision.  Bilateral 8 mm  robotic ports were placed 16 cm from the pubic symphysis and 10 cm lateral  to the camera port.  A 12 mm laparoscopic assistant port was placed in the  far right lateral abdominal wall and an additional 5 mm suction port was  placed between the camera port and the right robotic port.  All ports were  placed under direct vision and without difficulty.  The abdomen was then  inspected, and there was no evidence of any intra-abdominal injuries or  other abnormalities.  With the aid of the hook cautery, the bladder was then  reflected posteriorly, allowing entry into the space of Retzius and  identification of the endopelvic fascia.  The endopelvic fascia was then  incised from  the apex back to the base of the prostate, allowing the  underlying levator muscle fibers to be identified and swept laterally off  the prostate.  The puboprostatic ligaments  were then divided.  The 45 mm  ETS flex stapler was then used to staple and divide the dorsal vein complex  down to the urethra.  The Foley catheter was then manipulated allowing  identification of the bladder neck, which was then incised.  The Foley  catheter balloon was deflated and brought into the operative field and used  to retract the prostate anteriorly.  This exposed the posterior bladder neck  nicely, which was then divided.  The vas deferens and seminal vesicles were  then also identified and were each dissected out individually.  These  structures were then reflected anteriorly, and the space between  Denonvilliers fascia and the anterior rectum was then bluntly developed,  thereby  isolating the vascular pedicles of the prostate.  Attention was then  turned to the nerve-sparing portion of the procedure.  The lateral prostatic  fascia was sharply incised, allowing neurovascular bundles to be swept  laterally and posteriorly off of the prostate.  Attention then returned to  the vascular pedicles of the prostate, which were ligated with Hem-o-Lok  clips and sharply divided.  At the base of the prostate, the neurovascular  bundles were preserved bilaterally.  Distally, the urethra was then divided  with scissors.  The prostate specimen was then able to be placed up into the  abdomen.  The pelvis was then carefully examined for hemostasis.  There were  noted to be two small bleeding sites on the left side of the pelvic just  medial to the neurovascular bundles.  These were carefully clipped with the  metal clip applier.  This resulted in excellent hemostasis.  The pelvis was  then copiously irrigated.  With irrigation in the pelvis, air was injected  into the rectal Foley, and there was no evidence of any rectal injury.  Attention was then turned to the urethral anastomosis, a 2-0 Vicryl stitch  was placed between the bladder neck and urethra at the 6 o'clock position to  reapproximate these structures.  A double-armed 2-0 Monocryl suture was then  used to perform 360 degree running anastomosis between the bladder neck and  urethra in a tension-free and water-tight fashion.  A new 18 French coude  catheter was then inserted into the bladder and 15 cc of sterile water was  used to inflate the balloon.  The bladder was then irrigated with 120 cc of  saline.  There was no evidence of blood clots within the bladder or leakage  from the urethral anastomosis.  The surgical cart was then undocked.  The  Endopouch retrieval bag was then placed through the camera port and used to  retrieve the prostate specimen.  A 0 Vicryl stitch was used to close the right lateral 12 mm port site  with the aid of the suture closure device.  The 23 [place] Blake drain was then placed with the left robotic port and  appropriately positioned in the pelvis.  It was then secured to the skin  with a nylon suture.  All ports were then removed under direct vision.  The  camera port was then slightly extended superiorly and inferiorly, and the  prostate specimen was removed within the Endopouch retrieval bag.  This  fascia opening was then closed with a running 0 Vicryl suture.  All port  sites were then  injected with 0.25% Marcaine and all skin incisions were  closed with 4-0 Monocryl subcuticular closures.  Steri-Strips and sterile  dressings were applied.  The patient appeared to tolerate the procedure well  without complications.  All sponge and needle counts were correct x2 at the  end of the procedure.     ______________________________  Heloise Purpura, MD      Lucrezia Starch. Earlene Plater, M.D.  Electronically Signed    LB/MEDQ  D:  12/20/2004  T:  12/20/2004  Job:  [id number]

## 2010-07-19 NOTE — H&P (Signed)
Thomas [name], SCHEWE              ACCOUNT NO.:  [id number]   MEDICAL RECORD NO.:  [id number]          PATIENT TYPE:  INP   LOCATION:  1432                         FACILITY:  [facility]   PHYSICIAN:  Ronald L. Earlene Plater, M.D.  DATE OF BIRTH:  18-Jun-1960   DATE OF ADMISSION:  12/20/2004  DATE OF DISCHARGE:  12/23/2004                                HISTORY & PHYSICAL   CHIEF COMPLAINT:  Prostate cancer.   HISTORY OF PRESENT ILLNESS:  Mr. Visconti is a very nice 50 year old white  male, who was found to have an elevated PSA of 8.8 and underwent biopsy of  the prostate which revealed a Gleason score 6 which was 3 + 3  adenocarcinoma.  He has considered all options and after understanding the  risks, benefits, and alternatives, elected to proceed with robotic radical  prostatectomy.   PAST MEDICAL HISTORY:  1.  He has no known allergies.  2.  Hypertension.  3.  GERD.  4.  Psoriasis.   PAST SURGICAL HISTORY:  He had a ruptured disk in 1991.   MEDICATIONS:  Naprosyn and Motrin which were stopped prior to surgery.   SOCIAL HISTORY:  He is negative smoker.  He quit 12 years ago, and  occasionally he has a beer.   FAMILY HISTORY:  Not significant.   REVIEW OF SYSTEMS:  He has no shortness of breath, dyspnea on exertion,  chest pain, or GI complaint.   PHYSICAL EXAMINATION:  VITAL SIGNS:  Blood pressure is 160/90, pulse 72,  respirations 20, temperature 97.2 degrees F.  GENERAL:  He is a well-nourished, well-developed, well-groomed, oriented x  3.  HEENT:  Normal.  NECK:  Without masses or thyromegaly.  CHEST:  Normal diaphragmatic motion.  HEART:  Normal without murmurs or gallops.  ABDOMEN:  Soft, nontender, without masses, organomegaly, or hernias.  EXTREMITIES:  Normal.  NEUROLOGIC:  Intact.  SKIN:  Normal.  PENIS/MEATUS/SCROTUM/TESTICLE/ADNEXA/ANUS/PERINEUM:  Normal.  RECTAL:  Vault __________ prostate 20 g with some slight nodularity.   IMPRESSION:  Clinical stage T1C  adenocarcinoma of the prostate.   PLAN:  Robotic radical prostatectomy.      Ronald L. Earlene Plater, M.D.  Electronically Signed     RLD/MEDQ  D:  01/16/2005  T:  01/16/2005  Job:  [id number]

## 2011-02-26 ENCOUNTER — Encounter: Payer: Self-pay | Admitting: Cardiology

## 2012-05-26 ENCOUNTER — Other Ambulatory Visit (HOSPITAL_COMMUNITY): Payer: Self-pay | Admitting: Internal Medicine

## 2012-05-26 DIAGNOSIS — M5412 Radiculopathy, cervical region: Secondary | ICD-10-CM

## 2012-06-01 ENCOUNTER — Ambulatory Visit (HOSPITAL_COMMUNITY)
Admission: RE | Admit: 2012-06-01 | Discharge: 2012-06-01 | Disposition: A | Discharge status: Home / Self Care | Payer: 59 | Source: Ambulatory Visit | Attending: Internal Medicine | Admitting: Internal Medicine

## 2012-06-01 DIAGNOSIS — E041 Nontoxic single thyroid nodule: Secondary | ICD-10-CM | POA: Insufficient documentation

## 2012-06-01 DIAGNOSIS — R209 Unspecified disturbances of skin sensation: Secondary | ICD-10-CM | POA: Insufficient documentation

## 2012-06-01 DIAGNOSIS — M5412 Radiculopathy, cervical region: Secondary | ICD-10-CM

## 2012-06-01 DIAGNOSIS — IMO0002 Reserved for concepts with insufficient information to code with codable children: Secondary | ICD-10-CM | POA: Insufficient documentation

## 2012-06-01 DIAGNOSIS — M542 Cervicalgia: Secondary | ICD-10-CM | POA: Insufficient documentation

## 2012-06-01 DIAGNOSIS — M47812 Spondylosis without myelopathy or radiculopathy, cervical region: Secondary | ICD-10-CM | POA: Insufficient documentation

## 2012-06-03 ENCOUNTER — Other Ambulatory Visit (HOSPITAL_COMMUNITY): Payer: Self-pay | Admitting: Internal Medicine

## 2012-06-03 DIAGNOSIS — E041 Nontoxic single thyroid nodule: Secondary | ICD-10-CM

## 2012-06-07 ENCOUNTER — Ambulatory Visit (HOSPITAL_COMMUNITY)
Admission: RE | Admit: 2012-06-07 | Discharge: 2012-06-07 | Disposition: A | Discharge status: Home / Self Care | Payer: 59 | Source: Ambulatory Visit | Attending: Internal Medicine | Admitting: Internal Medicine

## 2012-06-07 DIAGNOSIS — E041 Nontoxic single thyroid nodule: Secondary | ICD-10-CM

## 2012-06-08 ENCOUNTER — Other Ambulatory Visit (HOSPITAL_COMMUNITY): Payer: Self-pay | Admitting: Internal Medicine

## 2012-06-08 DIAGNOSIS — R221 Localized swelling, mass and lump, neck: Secondary | ICD-10-CM

## 2012-06-11 ENCOUNTER — Other Ambulatory Visit (HOSPITAL_COMMUNITY): Payer: Self-pay | Admitting: Internal Medicine

## 2012-06-11 ENCOUNTER — Ambulatory Visit (HOSPITAL_COMMUNITY)
Admission: RE | Admit: 2012-06-11 | Discharge: 2012-06-11 | Disposition: A | Discharge status: Home / Self Care | Payer: 59 | Source: Ambulatory Visit | Attending: Internal Medicine | Admitting: Internal Medicine

## 2012-06-11 DIAGNOSIS — R221 Localized swelling, mass and lump, neck: Secondary | ICD-10-CM

## 2012-06-11 DIAGNOSIS — D34 Benign neoplasm of thyroid gland: Secondary | ICD-10-CM | POA: Insufficient documentation

## 2012-06-11 DIAGNOSIS — E041 Nontoxic single thyroid nodule: Secondary | ICD-10-CM | POA: Insufficient documentation

## 2012-06-11 MED ORDER — LIDOCAINE HCL (PF) 2 % IJ SOLN
INTRAMUSCULAR | Status: AC
  Administered 2012-06-11: 2 mL via INTRADERMAL
  Filled 2012-06-11: qty 20

## 2012-06-11 NOTE — Progress Notes (Signed)
Lidocaine 2%         2mL injected                 Bilateral thyroid biopsies performed  

## 2012-07-08 ENCOUNTER — Ambulatory Visit (INDEPENDENT_AMBULATORY_CARE_PROVIDER_SITE_OTHER): Payer: 59 | Admitting: Otolaryngology

## 2012-07-08 DIAGNOSIS — D449 Neoplasm of uncertain behavior of unspecified endocrine gland: Secondary | ICD-10-CM

## 2012-07-12 ENCOUNTER — Other Ambulatory Visit: Payer: Self-pay | Admitting: Otolaryngology

## 2012-08-11 ENCOUNTER — Encounter (HOSPITAL_COMMUNITY): Payer: Self-pay | Admitting: Pharmacy Technician

## 2012-08-16 NOTE — Pre-Procedure Instructions (Signed)
Bubba A Fouse  08/16/2012   Your procedure is scheduled on:  Wednesday, June 25th.  Report to [facility] at 6:30AM.       Enter through the Main Entrance, take the [facility] to the 3rd Floor- Short Stay.  Call this number if you have problems the morning of surgery: [phone]   Remember:   Do not eat food or drink liquids after midnight.   Take these medicines the morning of surgery with A SIP OF WATER: May take pain medication if needed.  If needed use  albuterol (PROVENTIL HFA;VENTOLIN HFA) and bing inhaler to the hospital with you.  Stop taking Aspirin, Coumadin, Plavix, Effient and Herbal medications.  Do not take any NSAIDs ie: Ibuprofen, Advil, Meloxicam (Mobic)Naproxen or any medication containing Aspirin.   Do not wear jewelry, make-up or nail polish.  Do not wear lotions, powders, or perfumes. You may wear deodorant.  Men may shave face and neck.  Do not bring valuables to the hospital.  [facility] is not responsible for any belongings or valuables.  Contacts, dentures or bridgework may not be worn into surgery.  Leave suitcase in the car. After surgery it may be brought to your room.  For patients admitted to the hospital, checkout time is 11:00 AM the day of discharge.    Special Instructions: Shower using CHG 2 nights before surgery and the night before surgery.  If you shower the day of surgery use CHG.  Use special wash - you have one bottle of CHG for all showers.  You should use approximately 1/3 of the bottle for each shower.   Please read over the following fact sheets that you were given: Pain Booklet, Coughing and Deep Breathing and Surgical Site Infection Prevention

## 2012-08-17 ENCOUNTER — Encounter (HOSPITAL_COMMUNITY): Payer: Self-pay

## 2012-08-17 ENCOUNTER — Encounter (HOSPITAL_COMMUNITY)
Admission: RE | Admit: 2012-08-17 | Discharge: 2012-08-17 | Disposition: A | Discharge status: Home / Self Care | Payer: 59 | Source: Ambulatory Visit | Attending: Otolaryngology | Admitting: Otolaryngology

## 2012-08-17 ENCOUNTER — Encounter (HOSPITAL_COMMUNITY)
Admission: RE | Admit: 2012-08-17 | Discharge: 2012-08-17 | Disposition: A | Discharge status: Home / Self Care | Payer: 59 | Source: Ambulatory Visit | Attending: Anesthesiology | Admitting: Anesthesiology

## 2012-08-17 DIAGNOSIS — Z01810 Encounter for preprocedural cardiovascular examination: Secondary | ICD-10-CM | POA: Insufficient documentation

## 2012-08-17 DIAGNOSIS — R0602 Shortness of breath: Secondary | ICD-10-CM | POA: Insufficient documentation

## 2012-08-17 DIAGNOSIS — Z01818 Encounter for other preprocedural examination: Secondary | ICD-10-CM | POA: Insufficient documentation

## 2012-08-17 DIAGNOSIS — I10 Essential (primary) hypertension: Secondary | ICD-10-CM | POA: Insufficient documentation

## 2012-08-17 DIAGNOSIS — Z01812 Encounter for preprocedural laboratory examination: Secondary | ICD-10-CM | POA: Insufficient documentation

## 2012-08-17 DIAGNOSIS — G4730 Sleep apnea, unspecified: Secondary | ICD-10-CM | POA: Insufficient documentation

## 2012-08-17 HISTORY — DX: Shortness of breath: R06.02

## 2012-08-17 LAB — BASIC METABOLIC PANEL
BUN: 19 mg/dL (ref 6–23)
Chloride: 105 mEq/L (ref 96–112)
Creatinine, Ser: 1.58 mg/dL — ABNORMAL HIGH (ref 0.50–1.35)
GFR calc Af Amer: 56 mL/min — ABNORMAL LOW (ref >90)
Glucose, Bld: 97 mg/dL (ref 70–99)

## 2012-08-17 LAB — CBC
HCT: 43.7 % (ref 39.0–52.0)
MCH: 30.5 pg (ref 26.0–34.0)
MCHC: 35 g/dL (ref 30.0–36.0)
RDW: 13 % (ref 11.5–15.5)

## 2012-08-17 LAB — SURGICAL PCR SCREEN: MRSA, PCR: NEGATIVE

## 2012-08-25 ENCOUNTER — Encounter (HOSPITAL_COMMUNITY)
Admission: RE | Disposition: A | Discharge status: Home / Self Care | Payer: Self-pay | Source: Ambulatory Visit | Attending: Otolaryngology

## 2012-08-25 ENCOUNTER — Encounter (HOSPITAL_COMMUNITY): Payer: Self-pay | Admitting: Surgery

## 2012-08-25 ENCOUNTER — Encounter (HOSPITAL_COMMUNITY): Payer: Self-pay | Admitting: Certified Registered"

## 2012-08-25 ENCOUNTER — Ambulatory Visit (HOSPITAL_COMMUNITY)
Admission: RE | Admit: 2012-08-25 | Discharge: 2012-08-26 | Disposition: A | Discharge status: Home / Self Care | Payer: 59 | Source: Ambulatory Visit | Attending: Otolaryngology | Admitting: Otolaryngology

## 2012-08-25 ENCOUNTER — Ambulatory Visit (HOSPITAL_COMMUNITY): Payer: 59 | Admitting: Certified Registered"

## 2012-08-25 DIAGNOSIS — E890 Postprocedural hypothyroidism: Secondary | ICD-10-CM

## 2012-08-25 DIAGNOSIS — Z9889 Other specified postprocedural states: Secondary | ICD-10-CM

## 2012-08-25 DIAGNOSIS — E041 Nontoxic single thyroid nodule: Secondary | ICD-10-CM | POA: Insufficient documentation

## 2012-08-25 HISTORY — DX: Gastro-esophageal reflux disease without esophagitis: K21.9

## 2012-08-25 HISTORY — PX: THYROIDECTOMY: SHX17

## 2012-08-25 HISTORY — PX: THYROIDECTOMY, PARTIAL: SHX18

## 2012-08-25 HISTORY — DX: Nontoxic single thyroid nodule: E04.1

## 2012-08-25 HISTORY — DX: Obstructive sleep apnea (adult) (pediatric): G47.33

## 2012-08-25 HISTORY — DX: Calculus of kidney: N20.0

## 2012-08-25 HISTORY — DX: Unspecified osteoarthritis, unspecified site: M19.90

## 2012-08-25 HISTORY — DX: Malignant neoplasm of prostate: C61

## 2012-08-25 HISTORY — DX: Chronic obstructive pulmonary disease, unspecified: J44.9

## 2012-08-25 SURGERY — THYROIDECTOMY
Anesthesia: General | Site: Neck | Laterality: Left | Wound class: Clean

## 2012-08-25 MED ORDER — 0.9 % SODIUM CHLORIDE (POUR BTL) OPTIME
TOPICAL | Status: DC | PRN
  Administered 2012-08-25: 1000 mL

## 2012-08-25 MED ORDER — IRBESARTAN 150 MG PO TABS
150.0000 mg | ORAL_TABLET | Freq: Every day | ORAL | Status: DC
  Administered 2012-08-25: 150 mg via ORAL
  Filled 2012-08-25 (×2): qty 1

## 2012-08-25 MED ORDER — ZOLPIDEM TARTRATE 5 MG PO TABS: 5.0000 mg | ORAL_TABLET | Freq: Every evening | ORAL | Status: DC | PRN

## 2012-08-25 MED ORDER — CEFAZOLIN SODIUM-DEXTROSE 2-3 GM-% IV SOLR
INTRAVENOUS | Status: AC
  Administered 2012-08-25: 2 g via INTRAVENOUS
  Filled 2012-08-25: qty 50

## 2012-08-25 MED ORDER — KCL IN DEXTROSE-NACL 20-5-0.45 MEQ/L-%-% IV SOLN
INTRAVENOUS | Status: AC
  Filled 2012-08-25: qty 1000

## 2012-08-25 MED ORDER — HYDROMORPHONE HCL PF 1 MG/ML IJ SOLN
0.2500 mg | INTRAMUSCULAR | Status: DC | PRN
  Administered 2012-08-25: 0.5 mg via INTRAVENOUS

## 2012-08-25 MED ORDER — FENTANYL CITRATE 0.05 MG/ML IJ SOLN: 50.0000 ug | Freq: Once | INTRAMUSCULAR | Status: DC

## 2012-08-25 MED ORDER — OXYCODONE HCL 5 MG/5ML PO SOLN: 5.0000 mg | Freq: Once | ORAL | Status: DC | PRN

## 2012-08-25 MED ORDER — PROMETHAZINE HCL 25 MG PO TABS: 25.0000 mg | ORAL_TABLET | Freq: Four times a day (QID) | ORAL | Status: DC | PRN

## 2012-08-25 MED ORDER — PROMETHAZINE HCL 25 MG RE SUPP: 25.0000 mg | Freq: Four times a day (QID) | RECTAL | Status: DC | PRN

## 2012-08-25 MED ORDER — OXYCODONE HCL 5 MG PO TABS
5.0000 mg | ORAL_TABLET | Freq: Once | ORAL | Status: DC | PRN
Start: 2012-08-25 — End: 2012-08-25

## 2012-08-25 MED ORDER — ALBUTEROL SULFATE HFA 108 (90 BASE) MCG/ACT IN AERS
2.0000 | INHALATION_SPRAY | RESPIRATORY_TRACT | Status: DC | PRN
  Filled 2012-08-25: qty 6.7

## 2012-08-25 MED ORDER — OXYCODONE-ACETAMINOPHEN 5-325 MG PO TABS
1.0000 | ORAL_TABLET | ORAL | Status: DC | PRN
  Administered 2012-08-25 – 2012-08-26 (×2): 2 via ORAL
  Filled 2012-08-25 (×2): qty 2

## 2012-08-25 MED ORDER — LACTATED RINGERS IV SOLN
INTRAVENOUS | Status: DC | PRN
  Administered 2012-08-25 (×2): via INTRAVENOUS

## 2012-08-25 MED ORDER — MORPHINE SULFATE 2 MG/ML IJ SOLN: 2.0000 mg | INTRAMUSCULAR | Status: DC | PRN

## 2012-08-25 MED ORDER — KCL IN DEXTROSE-NACL 20-5-0.45 MEQ/L-%-% IV SOLN
INTRAVENOUS | Status: DC
  Administered 2012-08-25 (×2): via INTRAVENOUS
  Filled 2012-08-25 (×5): qty 1000

## 2012-08-25 MED ORDER — LIDOCAINE-EPINEPHRINE 1 %-1:100000 IJ SOLN
INTRAMUSCULAR | Status: DC | PRN
  Administered 2012-08-25: 5 mL

## 2012-08-25 MED ORDER — EPHEDRINE SULFATE 50 MG/ML IJ SOLN
INTRAMUSCULAR | Status: DC | PRN
  Administered 2012-08-25: 10 mg via INTRAVENOUS
  Administered 2012-08-25: 15 mg via INTRAVENOUS
  Administered 2012-08-25: 10 mg via INTRAVENOUS
  Administered 2012-08-25: 15 mg via INTRAVENOUS

## 2012-08-25 MED ORDER — HYDROMORPHONE HCL PF 1 MG/ML IJ SOLN
INTRAMUSCULAR | Status: AC
  Filled 2012-08-25: qty 1

## 2012-08-25 MED ORDER — ONDANSETRON HCL 4 MG/2ML IJ SOLN
INTRAMUSCULAR | Status: DC | PRN
  Administered 2012-08-25: 4 mg via INTRAVENOUS

## 2012-08-25 MED ORDER — MIDAZOLAM HCL 2 MG/2ML IJ SOLN: 1.0000 mg | INTRAMUSCULAR | Status: DC | PRN

## 2012-08-25 MED ORDER — ROCURONIUM BROMIDE 100 MG/10ML IV SOLN
INTRAVENOUS | Status: DC | PRN
Start: 2012-08-25 — End: 2012-08-25
  Administered 2012-08-25: 30 mg via INTRAVENOUS

## 2012-08-25 MED ORDER — CLINDAMYCIN PHOSPHATE 300 MG/50ML IV SOLN
300.0000 mg | Freq: Four times a day (QID) | INTRAVENOUS | Status: AC
  Administered 2012-08-25 – 2012-08-26 (×4): 300 mg via INTRAVENOUS
  Filled 2012-08-25 (×4): qty 50

## 2012-08-25 MED ORDER — LIDOCAINE HCL (CARDIAC) 20 MG/ML IV SOLN
INTRAVENOUS | Status: DC | PRN
  Administered 2012-08-25: 100 mg via INTRAVENOUS

## 2012-08-25 MED ORDER — SUCCINYLCHOLINE CHLORIDE 20 MG/ML IJ SOLN
INTRAMUSCULAR | Status: DC | PRN
  Administered 2012-08-25: 100 mg via INTRAVENOUS

## 2012-08-25 MED ORDER — PHENYLEPHRINE HCL 10 MG/ML IJ SOLN
INTRAMUSCULAR | Status: DC | PRN
  Administered 2012-08-25 (×9): 80 ug via INTRAVENOUS

## 2012-08-25 MED ORDER — SUFENTANIL CITRATE 50 MCG/ML IV SOLN
INTRAVENOUS | Status: DC | PRN
  Administered 2012-08-25: 20 ug via INTRAVENOUS

## 2012-08-25 MED ORDER — MIDAZOLAM HCL 5 MG/5ML IJ SOLN
INTRAMUSCULAR | Status: DC | PRN
  Administered 2012-08-25: 2 mg via INTRAVENOUS

## 2012-08-25 MED ORDER — LIDOCAINE-EPINEPHRINE 1 %-1:100000 IJ SOLN
INTRAMUSCULAR | Status: AC
  Filled 2012-08-25: qty 1

## 2012-08-25 MED ORDER — PROPOFOL 10 MG/ML IV BOLUS
INTRAVENOUS | Status: DC | PRN
  Administered 2012-08-25: 200 mg via INTRAVENOUS
  Administered 2012-08-25: 50 mg via INTRAVENOUS

## 2012-08-25 SURGICAL SUPPLY — 46 items
ADH SKN CLS APL DERMABOND .7 (GAUZE/BANDAGES/DRESSINGS) ×2
ATTRACTOMAT 16X20 MAGNETIC DRP (DRAPES) ×2 IMPLANT
BLADE SURG CLIPPER 3M 9600 (MISCELLANEOUS) IMPLANT
CANISTER SUCTION 2500CC (MISCELLANEOUS) ×2 IMPLANT
CLEANER TIP ELECTROSURG 2X2 (MISCELLANEOUS) ×2 IMPLANT
CLIP TI WIDE RED SMALL 24 (CLIP) IMPLANT
CLOTH BEACON ORANGE TIMEOUT ST (SAFETY) ×2 IMPLANT
CONT SPEC 4OZ CLIKSEAL STRL BL (MISCELLANEOUS) ×2 IMPLANT
CORDS BIPOLAR (ELECTRODE) ×2 IMPLANT
COVER SURGICAL LIGHT HANDLE (MISCELLANEOUS) ×2 IMPLANT
CRADLE DONUT ADULT HEAD (MISCELLANEOUS) ×2 IMPLANT
DERMABOND ADVANCED (GAUZE/BANDAGES/DRESSINGS) ×2
DERMABOND ADVANCED .7 DNX12 (GAUZE/BANDAGES/DRESSINGS) ×2 IMPLANT
DRAIN CHANNEL 10F 3/8 F FF (DRAIN) ×2 IMPLANT
ELECT COATED BLADE 2.86 ST (ELECTRODE) ×2 IMPLANT
ELECT REM PT RETURN 9FT ADLT (ELECTROSURGICAL) ×2
ELECTRODE REM PT RTRN 9FT ADLT (ELECTROSURGICAL) ×1 IMPLANT
EVACUATOR SILICONE 100CC (DRAIN) ×2 IMPLANT
GAUZE SPONGE 4X4 16PLY XRAY LF (GAUZE/BANDAGES/DRESSINGS) IMPLANT
GLOVE BIO SURGEON STRL SZ 6.5 (GLOVE) ×2 IMPLANT
GLOVE BIO SURGEON STRL SZ7.5 (GLOVE) ×1 IMPLANT
GLOVE BIOGEL PI IND STRL 6.5 (GLOVE) ×1 IMPLANT
GLOVE BIOGEL PI IND STRL 7.5 (GLOVE) IMPLANT
GLOVE BIOGEL PI INDICATOR 6.5 (GLOVE) ×1
GLOVE BIOGEL PI INDICATOR 7.5 (GLOVE) ×1
GLOVE ECLIPSE 7.5 STRL STRAW (GLOVE) ×2 IMPLANT
GLOVE EUDERMIC 7 POWDERFREE (GLOVE) ×1 IMPLANT
GOWN STRL NON-REIN LRG LVL3 (GOWN DISPOSABLE) ×6 IMPLANT
HEMOSTAT SURGICEL .5X2 ABSORB (HEMOSTASIS) IMPLANT
KIT BASIN OR (CUSTOM PROCEDURE TRAY) ×2 IMPLANT
KIT ROOM TURNOVER OR (KITS) ×2 IMPLANT
LOCATOR NERVE 3 VOLT (DISPOSABLE) ×2 IMPLANT
NS IRRIG 1000ML POUR BTL (IV SOLUTION) ×2 IMPLANT
PAD ARMBOARD 7.5X6 YLW CONV (MISCELLANEOUS) ×4 IMPLANT
PENCIL BUTTON HOLSTER BLD 10FT (ELECTRODE) ×2 IMPLANT
SHEARS HARMONIC 9CM CVD (BLADE) ×2 IMPLANT
SPONGE INTESTINAL PEANUT (DISPOSABLE) ×2 IMPLANT
SUT ETHILON 2 0 FS 18 (SUTURE) ×2 IMPLANT
SUT SILK 2 0 FS (SUTURE) ×2 IMPLANT
SUT SILK 3 0 REEL (SUTURE) ×2 IMPLANT
SUT VICRYL 4-0 PS2 18IN ABS (SUTURE) ×4 IMPLANT
TOWEL OR 17X24 6PK STRL BLUE (TOWEL DISPOSABLE) ×2 IMPLANT
TOWEL OR 17X26 10 PK STRL BLUE (TOWEL DISPOSABLE) ×2 IMPLANT
TRAY ENT MC OR (CUSTOM PROCEDURE TRAY) ×2 IMPLANT
TRAY FOLEY CATH 14FRSI W/METER (CATHETERS) IMPLANT
WATER STERILE IRR 1000ML POUR (IV SOLUTION) IMPLANT

## 2012-08-25 NOTE — H&P (Signed)
Cc: Thyroid nodules  HPI: The patient is a 52 year old male who presents today for evaluation of his thyroid nodules. The patient is seen in consultation requested by Dr. Carylon Perches. According to the patient, he was found to have bilateral thyroid nodules on his neck MRI scan. He subsequently underwent fine needle aspiration of his bilateral thyroid nodules.  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  The right FNA results are consistent with benign goiter.  Currently the patient denies any dysphagia, odynophagia, or dysphonia.  He denies any recent weight loss.  He has no previous history of ENT surgery.   The patient's review of systems (constitutional, eyes, ENT, cardiovascular, respiratory, GI, musculoskeletal, skin, neurologic, psychiatric, endocrine, hematologic, allergic) is noted in the ROS questionnaire.  It is reviewed with the patient.    Past Medical History (Major events, hospitalizations, surgeries):  Prostate cancer surgery.     Known allergies: NKDA.     Ongoing medical problems: Prostate cancer.     Family medical history: None.     Social history: The patient is married. He denies the use of tobacco, alcohol or illegal drugs.  Exam: General: Communicates without difficulty, well nourished, no acute distress.  Head: Normocephalic, no evidence injury, no tenderness, facial buttresses intact without stepoff.  Eyes: PERRL, EOMI. No scleral icterus, conjunctivae clear.  Neuro: CN II exam reveals vision grossly intact.  No nystagmus at any point of gaze.  Ears: Auricles well formed without lesions.  Ear canals are intact without mass or lesion.  No erythema or edema is appreciated.  The TMs are intact without fluid.  Nose: External evaluation reveals normal support and skin without lesions.  Dorsum is intact.  Anterior rhinoscopy reveals healthy pink mucosa over anterior aspect of inferior turbinates and intact septum.  No purulence noted.  Oral:  Oral  cavity and oropharynx are intact, symmetric, without erythema or edema.  Mucosa is moist without lesions.  Neck: Full range of motion without pain.  There is no significant lymphadenopathy.  No masses palpable.  Thyroid bed within normal limits to palpation.  Parotid glands and submandibular glands equal bilaterally without mass.  Trachea is midline.  Neuro:  CN 2-12 grossly intact. Gait normal.    Procedure:  Flexible Fiberoptic Laryngoscopy Risks, benefits, and alternatives of flexible endoscopy were explained to the patient.  Specific mention was made of the risk of throat numbness with difficulty swallowing, possible bleeding from the nose and mouth, and pain from the procedure.  The patient gave oral consent to proceed.  The nasal cavities were decongested and anesthetised with a combination of oxymetazoline and 4% lidocaine solution.  The flexible scope was inserted into the right nasal cavity and advanced towards the nasopharynx.  Visualized mucosa over the turbinates and septum were as described above.  The nasopharynx was clear.  Oropharyngeal walls were symmetric and mobile without lesion, mass, or edema.  Hypopharynx was also without  lesion or edema.  Larynx was mobile without lesions. Supraglottic structures were free of edema, mass, and asymmetry.  True vocal folds were white without mass or lesion.  Base of tongue was within normal limits.  A: 1.  The FNA results are reviewed with the patient. It is explained to the patient that Hurthle cell lesions can be benign or malignant.  However, the final diagnosis will need to be made on a tissue level.  The patient is currently asymptomatic. 2.  The patient has a normal laryngoscopy evaluation.  His  vocal cords are mobile bilaterally.  P: 1.  In light of the FNA results, it is recommended the patient should undergo a left hemithyroidectomy procedure to ascertain the histologic diagnosis.  The risks, benefits, alternatives, and details of the  procedure are reviewed with the patient.   Questions are invited and answered.  2.  The patient would like to proceed with the left hemithyroidectomy procedure.  We will schedule the procedure in accordance with the patient's schedule.

## 2012-08-25 NOTE — Transfer of Care (Signed)
Immediate Anesthesia Transfer of Care Note  Patient: Thomas [name]  Procedure(s) Performed: Procedure(s): LEFT PARTIAL THYROIDECTOMY (Left)  Patient Location: PACU  Anesthesia Type:General  Level of Consciousness: alert  and oriented  Airway & Oxygen Therapy: Patient Spontanous Breathing and Patient connected to nasal cannula oxygen  Post-op Assessment: Report given to PACU RN, Post -op Vital signs reviewed and stable and Patient moving all extremities  Post vital signs: Reviewed and stable  Complications: No apparent anesthesia complications

## 2012-08-25 NOTE — Preoperative (Signed)
Beta Blockers   Reason not to administer Beta Blockers:Not Applicable 

## 2012-08-25 NOTE — Anesthesia Preprocedure Evaluation (Addendum)
Anesthesia Evaluation  Patient identified by MRN, date of birth, ID band Patient awake    Reviewed: Allergy & Precautions, H&P , NPO status , Patient's Chart, lab work & pertinent test results  Airway Mallampati: II TM Distance: >3 FB Neck ROM: Limited    Dental  (+) Teeth Intact and Dental Advisory Given   Pulmonary shortness of breath, sleep apnea , former smoker,  + rhonchi         Cardiovascular hypertension, Pt. on medications Rhythm:Regular Rate:Normal     Neuro/Psych    GI/Hepatic   Endo/Other    Renal/GU      Musculoskeletal   Abdominal (+) + obese,   Peds  Hematology   Anesthesia Other Findings   Reproductive/Obstetrics                          Anesthesia Physical Anesthesia Plan  ASA: III  Anesthesia Plan: General   Post-op Pain Management:    Induction: Intravenous  Airway Management Planned: Oral ETT  Additional Equipment:   Intra-op Plan:   Post-operative Plan: Extubation in OR  Informed Consent: I have reviewed the patients History and Physical, chart, labs and discussed the procedure including the risks, benefits and alternatives for the proposed anesthesia with the patient or authorized representative who has indicated his/her understanding and acceptance.   Dental advisory given  Plan Discussed with: CRNA, Surgeon and Anesthesiologist  Anesthesia Plan Comments: (Glidescope standby)       Anesthesia Quick Evaluation

## 2012-08-25 NOTE — Brief Op Note (Signed)
08/25/2012  10:01 AM  PATIENT:  Pearletha Furl Milos  52 y.o. male  PRE-OPERATIVE DIAGNOSIS:  LEFT THYROID MASS  POST-OPERATIVE DIAGNOSIS:  LEFT THYROID MASS  PROCEDURE:  Procedure(s): LEFT HEMITHYROIDECTOMY (Left)  SURGEON:  Surgeon(s) and Role:    * Darletta Moll, MD - Primary  PHYSICIAN ASSISTANT:  Roma Schanz, PA-C  ASSISTANTS: Roma Schanz, PA-C   ANESTHESIA:   general  EBL:     BLOOD ADMINISTERED:none  DRAINS: (#10) Jackson-Pratt drain(s) with closed bulb suction in the neck   LOCAL MEDICATIONS USED:  LIDOCAINE   SPECIMEN:  Source of Specimen:  Left thyroid lobe  DISPOSITION OF SPECIMEN:  PATHOLOGY  COUNTS:  YES  TOURNIQUET:  * No tourniquets in log *  DICTATION: .Other Dictation: Dictation Number  [id number]  PLAN OF CARE: Admit for overnight observation  PATIENT DISPOSITION:  PACU - hemodynamically stable.   Delay start of Pharmacological VTE agent (>24hrs) due to surgical blood loss or risk of bleeding: not applicable

## 2012-08-25 NOTE — Anesthesia Procedure Notes (Signed)
Procedure Name: Intubation Date/Time: 08/25/2012 8:31 AM Performed by: Charm Barges, Marta Bouie R Pre-anesthesia Checklist: Patient identified, Emergency Drugs available, Suction available, Patient being monitored and Timeout performed Patient Re-evaluated:Patient Re-evaluated prior to inductionOxygen Delivery Method: Circle system utilized Preoxygenation: Pre-oxygenation with 100% oxygen Intubation Type: IV induction Ventilation: Mask ventilation without difficulty Laryngoscope Size: Mac and 4 Grade View: Grade II Tube type: Oral Tube size: 8.0 mm Number of attempts: 1 Airway Equipment and Method: Stylet Placement Confirmation: ETT inserted through vocal cords under direct vision,  positive ETCO2 and breath sounds checked- equal and bilateral Secured at: 23 cm Tube secured with: Tape Dental Injury: Teeth and Oropharynx as per pre-operative assessment

## 2012-08-25 NOTE — Anesthesia Postprocedure Evaluation (Signed)
  Anesthesia Post-op Note  Patient: Thomas [name]  Procedure(s) Performed: Procedure(s): LEFT PARTIAL THYROIDECTOMY (Left)  Patient Location: PACU  Anesthesia Type:General  Level of Consciousness: awake  Airway and Oxygen Therapy: Patient Spontanous Breathing  Post-op Pain: mild  Post-op Assessment: Post-op Vital signs reviewed, Patient's Cardiovascular Status Stable, Respiratory Function Stable, Patent Airway, No signs of Nausea or vomiting and Pain level controlled  Post-op Vital Signs: stable  Complications: No apparent anesthesia complications

## 2012-08-25 NOTE — Op Note (Signed)
NAMETOMASZ, STEEVES NO.:  [id number]  MEDICAL RECORD NO.:  [id number]  LOCATION:  [facility]                         FACILITY:  [facility]  PHYSICIAN:  Newman Pies, MD            DATE OF BIRTH:  [birth year]  DATE OF PROCEDURE:  08/25/2012 DATE OF DISCHARGE:                              OPERATIVE REPORT   SURGEON:  Newman Pies, MD  ASSISTANT: Roma Schanz, PA-C  PREOPERATIVE DIAGNOSIS:  Bilateral thyroid nodules.  POSTOPERATIVE DIAGNOSIS:  Bilateral thyroid nodules.  PROCEDURE PERFORMED:  Left hemithyroidectomy.  ANESTHESIA:  General endotracheal tube anesthesia.  COMPLICATIONS:  None.  ESTIMATED BLOOD LOSS:  Less than 20 mL.  INDICATION FOR PROCEDURE:  The patient is a 52 year old male with a history of bilateral thyroid nodules.  He previously underwent bilateral FNA biopsy of his thyroid nodules.  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  The right FNA results were consistent with a benign goiter.  Based on the above findings, the decision was made for the patient to undergo the left hemithyroidectomy surgery to obtain definitive histologic diagnosis.  The risks, benefits, alternatives, and details of the procedure were discussed with the patient.  Questions were invited and answered.  Informed consent was obtained.  DESCRIPTION:  The patient was taken to the operating room and placed supine on the operating table.  General endotracheal tube anesthesia was administered by the anesthesiologist.  Preop IV antibiotics was given. The patient was positioned and prepped and draped in a standard fashion for left hemithyroidectomy surgery.  1% lidocaine with 1:100,000 epinephrine was injected at the site of incisions.  A transverse lower neck incision was made.  The incision was carried down to the level of the platysma muscles.  Superior and inferiorly-based subplatysmal flaps were elevated in a standard fashion.  The  sternocleidomastoid muscles were retracted laterally.  The strap muscles were then divided at midline and retracted laterally to expose the thyroid gland.  An inferior left thyroid nodule was noted.  The left thyroid lobe was then carefully dissected free from the surrounding soft tissue.  The left recurrent laryngeal nerve was identified and preserved.  The nerve was noted to be functional with nerve stimulation at the end of the case. The entire left thyroid lobe was then removed and sent to the Pathology Department for frozen section analysis.  The frozen results were consistent with follicular lesion.  The final pathology result was different.  The surgical site was copiously irrigated.  Hemostasis was achieved with a combination of bipolar and Bovie electrocautery.  A #10 JP drain was placed.  The strap muscles were reapproximated with 4-0 Vicryl sutures.  The skin was then closed in layers with 4-0 Vicryl and Dermabond.  The care of the patient was turned over to the anesthesiologist.  The patient was awakened from anesthesia without difficulty.  He was extubated and transferred to the recovery room in good condition.  OPERATIVE FINDINGS:  The left thyroid lobe was removed without difficulty.  An inferior thyroid nodule was noted.  The left recurrent laryngeal nerve was identified and preserved.  SPECIMENS:  Left thyroid lobe.  FOLLOWUP  CARE:  The patient will be observed overnight in the hospital. He will most likely be discharged home on postop day #1.  The patient will follow up in my office in approximately 1 week.     Newman Pies, MD     ST/MEDQ  D:  08/25/2012  T:  08/25/2012  Job:  [id number]  cc:   Kingsley Callander. Ouida Sills, MD

## 2012-08-26 MED ORDER — CLINDAMYCIN HCL 300 MG PO CAPS: 300.0000 mg | ORAL_CAPSULE | Freq: Three times a day (TID) | ORAL | Status: AC

## 2012-08-26 MED ORDER — OXYCODONE-ACETAMINOPHEN 5-325 MG PO TABS: 1.0000 | ORAL_TABLET | ORAL | Status: DC | PRN

## 2012-08-26 NOTE — Discharge Summary (Signed)
Physician Discharge Summary  Patient ID: Thomas [name] MRN: [id number] DOB/AGE: Aug 01, 1960 52 y.o.  Admit date: 08/25/2012 Discharge date: 08/26/2012  Admission Diagnoses: Thyroid nodules  Discharge Diagnoses: Thyroid nodules  Active Problems:   * No active hospital problems. *   Discharged Condition: good  Hospital Course: Pt had an uneventful overnight stay. Pt tolerated po well. No bleeding. No stridor. Voice is strong.  Consults: None  Significant Diagnostic Studies: none  Treatments: surgery: Left hemithyroidectomy  Discharge Exam: Blood pressure 136/88, pulse 96, temperature 98.4 F (36.9 C), temperature source Oral, resp. rate 16, height 5\' 10"  (1.778 m), weight 118.842 kg (262 lb), SpO2 93.00%. Incision/Wound: clean, dry, intact Voice is strong  Disposition:   Discharge Orders   Future Orders Complete By Expires     Diet general  As directed     Increase activity slowly  As directed         Medication List    STOP taking these medications       HYDROcodone-acetaminophen 10-325 MG per tablet  Commonly known as:  NORCO      TAKE these medications       albuterol 108 (90 BASE) MCG/ACT inhaler  Commonly known as:  PROVENTIL HFA;VENTOLIN HFA  Inhale 2 puffs into the lungs every 4 (four) hours as needed.     clindamycin 300 MG capsule  Commonly known as:  CLEOCIN  Take 1 capsule (300 mg total) by mouth 3 (three) times daily.     meloxicam 15 MG tablet  Commonly known as:  MOBIC  Take 15 mg by mouth daily.     naproxen 500 MG tablet  Commonly known as:  NAPROSYN  Take 500 mg by mouth daily as needed. Pain     olmesartan 20 MG tablet  Commonly known as:  BENICAR  Take 20 mg by mouth daily.     oxyCODONE-acetaminophen 5-325 MG per tablet  Commonly known as:  ROXICET  Take 1-2 tablets by mouth every 4 (four) hours as needed for pain.     traMADol 50 MG tablet  Commonly known as:  ULTRAM  Take 50 mg by mouth every 6 (six) hours as needed for  pain.           Follow-up Information   Follow up with Darletta Moll, MD On 09/01/2012. (at 2:10pm)    Contact information:   [address] [place] [address] [phone]       Signed: Darletta Moll 08/26/2012, 7:35 AM

## 2012-08-27 ENCOUNTER — Encounter (HOSPITAL_COMMUNITY): Payer: Self-pay | Admitting: Otolaryngology

## 2012-12-02 ENCOUNTER — Other Ambulatory Visit: Payer: Self-pay | Admitting: Neurosurgery

## 2012-12-02 DIAGNOSIS — M5410 Radiculopathy, site unspecified: Secondary | ICD-10-CM

## 2012-12-10 ENCOUNTER — Ambulatory Visit
Admission: RE | Admit: 2012-12-10 | Discharge: 2012-12-10 | Disposition: A | Discharge status: Home / Self Care | Payer: 59 | Source: Ambulatory Visit | Attending: Neurosurgery | Admitting: Neurosurgery

## 2012-12-10 VITALS — BP 132/90 | HR 84

## 2012-12-10 DIAGNOSIS — M5410 Radiculopathy, site unspecified: Secondary | ICD-10-CM

## 2012-12-10 MED ORDER — IOHEXOL 300 MG/ML  SOLN
10.0000 mL | Freq: Once | INTRAMUSCULAR | Status: AC | PRN
  Administered 2012-12-10: 10 mL via INTRAVENOUS

## 2012-12-10 MED ORDER — DIAZEPAM 5 MG PO TABS
10.0000 mg | ORAL_TABLET | Freq: Once | ORAL | Status: AC
  Administered 2012-12-10: 10 mg via ORAL

## 2013-03-22 ENCOUNTER — Other Ambulatory Visit (HOSPITAL_COMMUNITY): Payer: Self-pay | Admitting: Orthopedic Surgery

## 2013-03-22 DIAGNOSIS — M25469 Effusion, unspecified knee: Secondary | ICD-10-CM

## 2013-03-25 ENCOUNTER — Encounter (HOSPITAL_COMMUNITY): Payer: Self-pay

## 2013-03-25 ENCOUNTER — Ambulatory Visit (HOSPITAL_COMMUNITY)
Admission: RE | Admit: 2013-03-25 | Discharge: 2013-03-25 | Disposition: A | Discharge status: Home / Self Care | Payer: 59 | Source: Ambulatory Visit | Attending: Orthopedic Surgery | Admitting: Orthopedic Surgery

## 2013-03-25 DIAGNOSIS — M25569 Pain in unspecified knee: Secondary | ICD-10-CM | POA: Insufficient documentation

## 2013-03-25 DIAGNOSIS — M259 Joint disorder, unspecified: Secondary | ICD-10-CM | POA: Insufficient documentation

## 2013-03-25 DIAGNOSIS — M25469 Effusion, unspecified knee: Secondary | ICD-10-CM | POA: Insufficient documentation

## 2013-07-11 ENCOUNTER — Encounter (HOSPITAL_BASED_OUTPATIENT_CLINIC_OR_DEPARTMENT_OTHER): Payer: Self-pay | Admitting: *Deleted

## 2013-07-11 NOTE — Progress Notes (Signed)
Will go to [facility] for [facility]-ekg done 6/14 when he had thyroid surgery No cardiac problems Does have mod sleep apnea-cannot use cpap-will bring overnight bag and meds

## 2013-07-12 ENCOUNTER — Other Ambulatory Visit: Payer: Self-pay | Admitting: Physician Assistant

## 2013-07-12 ENCOUNTER — Encounter: Payer: Self-pay | Admitting: Physician Assistant

## 2013-07-12 ENCOUNTER — Encounter (HOSPITAL_COMMUNITY)
Admission: RE | Admit: 2013-07-12 | Discharge: 2013-07-12 | Disposition: A | Discharge status: Home / Self Care | Payer: 59 | Source: Ambulatory Visit | Attending: Orthopedic Surgery | Admitting: Orthopedic Surgery

## 2013-07-12 DIAGNOSIS — S83249A Other tear of medial meniscus, current injury, unspecified knee, initial encounter: Secondary | ICD-10-CM

## 2013-07-12 DIAGNOSIS — J4489 Other specified chronic obstructive pulmonary disease: Secondary | ICD-10-CM | POA: Insufficient documentation

## 2013-07-12 DIAGNOSIS — S83289A Other tear of lateral meniscus, current injury, unspecified knee, initial encounter: Secondary | ICD-10-CM

## 2013-07-12 DIAGNOSIS — K219 Gastro-esophageal reflux disease without esophagitis: Secondary | ICD-10-CM

## 2013-07-12 DIAGNOSIS — G4733 Obstructive sleep apnea (adult) (pediatric): Secondary | ICD-10-CM

## 2013-07-12 DIAGNOSIS — M1990 Unspecified osteoarthritis, unspecified site: Secondary | ICD-10-CM

## 2013-07-12 DIAGNOSIS — N200 Calculus of kidney: Secondary | ICD-10-CM

## 2013-07-12 DIAGNOSIS — E041 Nontoxic single thyroid nodule: Secondary | ICD-10-CM

## 2013-07-12 DIAGNOSIS — C61 Malignant neoplasm of prostate: Secondary | ICD-10-CM

## 2013-07-12 DIAGNOSIS — J449 Chronic obstructive pulmonary disease, unspecified: Secondary | ICD-10-CM

## 2013-07-12 DIAGNOSIS — I10 Essential (primary) hypertension: Secondary | ICD-10-CM

## 2013-07-12 LAB — BASIC METABOLIC PANEL
BUN: 22 mg/dL (ref 6–23)
CALCIUM: 10.2 mg/dL (ref 8.4–10.5)
CO2: 28 mEq/L (ref 19–32)
CREATININE: 2.43 mg/dL — AB (ref 0.50–1.35)
Chloride: 103 mEq/L (ref 96–112)
GFR, EST AFRICAN AMERICAN: 33 mL/min — AB (ref >90)
GFR, EST NON AFRICAN AMERICAN: 29 mL/min — AB (ref >90)
Glucose, Bld: 102 mg/dL — ABNORMAL HIGH (ref 70–99)
Potassium: 4.8 mEq/L (ref 3.7–5.3)
Sodium: 142 mEq/L (ref 137–147)

## 2013-07-12 MED ORDER — FENTANYL CITRATE 0.05 MG/ML IJ SOLN: 50.0000 ug | INTRAMUSCULAR | Status: DC | PRN

## 2013-07-12 MED ORDER — MIDAZOLAM HCL 2 MG/2ML IJ SOLN
1.0000 mg | INTRAMUSCULAR | Status: DC | PRN
Start: 2013-07-12 — End: 2013-07-13

## 2013-07-12 MED ORDER — LACTATED RINGERS IV SOLN: INTRAVENOUS | Status: DC

## 2013-07-12 NOTE — H&P (Signed)
Thomas [name] is an 53 y.o. male.   Chief Complaint: right knee pain HPI: Thomas [name] is a 53 year old seen for follow-up from his right knee medial and lateral meniscal tears with medial compartment chondromalacia. He continues to have significant pain that has been bothering him for 5-6 months. He had an MRI on 03/25/13 that showed meniscal tearing with medial compartment chondral defect. He has tried to work. He has pain on a daily basis, pain with weightbearing and activity getting progressively worse.    Past Medical History  Diagnosis Date  . Hypertension   . COPD (chronic obstructive pulmonary disease)   . Shortness of breath     "can happen at anytime" (08/28/2012)  . GERD (gastroesophageal reflux disease)   . Arthritis     "touch in my neck" (August 28, 2012)  . Kidney stones     "passed on their own" (08-28-12)  . Prostate cancer   . Thyroid nodule     bilaterally/notes 08-28-2012  . OSA (obstructive sleep apnea)     "cannot sleep w/mask" (Aug 28, 2012)  . Medial meniscus tear   . Lateral meniscus tear     Past Surgical History  Procedure Laterality Date  . Nasal sinus surgery  01/2009  . Robot assisted laparoscopic radical prostatectomy  2006  . Back surgery  1990s  . Thyroidectomy, partial Left 08/28/2012  . Lumbar disc surgery  1990's    "had 2 ruptured discs" (28-Aug-2012)  . Thyroidectomy Left 08/28/12    Procedure: LEFT PARTIAL THYROIDECTOMY;  Surgeon: Ascencion Dike, MD;  Location: [facility] OR;  Service: ENT;  Laterality: Left;    Family History  Problem Relation Age of Onset  . Aneurysm Mother   . Heart attack Father    Social History:  reports that he quit smoking about 20 years ago. His smoking use included Cigarettes. He has a 45 pack-year smoking history. He has never used smokeless tobacco. He reports that he drinks about 8.4 ounces of alcohol per week. He reports that he does not use illicit drugs.  Allergies: No Known Allergies Current Outpatient Prescriptions on File  Prior to Visit  Medication Sig Dispense Refill  . albuterol (PROVENTIL HFA;VENTOLIN HFA) 108 (90 BASE) MCG/ACT inhaler Inhale 2 puffs into the lungs every 4 (four) hours as needed.        . meloxicam (MOBIC) 15 MG tablet Take 15 mg by mouth daily.      . naproxen (NAPROSYN) 500 MG tablet Take 500 mg by mouth daily as needed. Pain      . olmesartan (BENICAR) 20 MG tablet Take 20 mg by mouth daily.        Marland Kitchen oxyCODONE-acetaminophen (ROXICET) 5-325 MG per tablet Take 1-2 tablets by mouth every 4 (four) hours as needed for pain.  30 tablet  0  . traMADol (ULTRAM) 50 MG tablet Take 50 mg by mouth every 6 (six) hours as needed for pain.       Current Facility-Administered Medications on File Prior to Visit  Medication Dose Route Frequency Provider Last Rate Last Dose  . fentaNYL (SUBLIMAZE) injection 50-100 mcg  50-100 mcg Intravenous PRN Arabella Merles, MD      . Derrill Memo ON 07/13/2013] lactated ringers infusion   Intravenous Continuous Arabella Merles, MD      . midazolam (VERSED) injection 1-2 mg  1-2 mg Intravenous PRN Arabella Merles, MD         (Not in a hospital admission)  Results for orders placed  during the hospital encounter of 07/12/13 (from the past 48 hour(s))  BASIC METABOLIC PANEL     Status: Abnormal   Collection Time    07/12/13  9:30 AM      Result Value Ref Range   Sodium 142  137 - 147 mEq/L   Potassium 4.8  3.7 - 5.3 mEq/L   Chloride 103  96 - 112 mEq/L   CO2 28  19 - 32 mEq/L   Glucose, Bld 102 (*) 70 - 99 mg/dL   BUN 22  6 - 23 mg/dL   Creatinine, Ser 2.43 (*) 0.50 - 1.35 mg/dL   Calcium 10.2  8.4 - 10.5 mg/dL   GFR calc non Af Amer 29 (*) >90 mL/min   GFR calc Af Amer 33 (*) >90 mL/min   Comment: (NOTE)     The eGFR has been calculated using the CKD EPI equation.     This calculation has not been validated in all clinical situations.     eGFR's persistently <90 mL/min signify possible Chronic Kidney     Disease.   No results found.  Review of  Systems  Constitutional: Negative.   HENT: Negative.   Eyes: Negative.   Respiratory: Negative.   Cardiovascular: Negative.   Gastrointestinal: Negative.   Genitourinary: Negative.   Musculoskeletal: Positive for joint pain.       Right knee pain  Neurological: Negative.   Endo/Heme/Allergies: Negative.   Psychiatric/Behavioral: Negative.     Blood pressure 130/90, height _0  (1.778 m), weight 120.203 kg (265 lb). Physical Exam  Constitutional: He is oriented to person, place, and time. He appears well-developed and well-nourished.  HENT:  Head: Normocephalic and atraumatic.  Eyes: Conjunctivae are normal. Pupils are equal, round, and reactive to light.  Neck: Neck supple.  Cardiovascular: Normal rate.   Respiratory: Effort normal.  GI: Soft.  Musculoskeletal:  Examination of his right knee reveals pain on the medial joint line positive medial McMurray's, 1+ effusion full range of motion knee is stable with normal patella tracking. Exam of the left knee reveals full range of motion without pain swelling weakness or instability. Vascular exam: pulses 2+ and symmetric.  Neurological: He is alert and oriented to person, place, and time.  Skin: Skin is warm and dry.  Psychiatric: He has a normal mood and affect.     Assessment Patient Active Problem List   Diagnosis Date Noted  . Hypertension   . COPD (chronic obstructive pulmonary disease)   . GERD (gastroesophageal reflux disease)   . Arthritis   . Kidney stones   . Prostate cancer   . Thyroid nodule   . OSA (obstructive sleep apnea)   . Medial meniscus tear   . Lateral meniscus tear   . OBSTRUCTIVE SLEEP APNEA 11/29/2009  . HYPERSOMNIA 09/26/2009  . DYSPNEA 09/26/2009  . PROSTATE CANCER, HX OF 09/26/2009  . HYPERTENSION 09/25/2009   Plan I talk to him and his wife about this in detail. I recommend with these findings and his significant persistent pain that we proceed with right knee arthroscopy with attention to  his meniscal and chondral pathology. Discussed risks benefits and possible complications of surgery in detail and they understand this completely.  He will be out of work for 6-8 weeks   Linda Hedges 07/12/2013, 11:55 AM

## 2013-07-13 ENCOUNTER — Encounter (HOSPITAL_BASED_OUTPATIENT_CLINIC_OR_DEPARTMENT_OTHER)
Admission: RE | Disposition: A | Discharge status: Home / Self Care | Payer: Self-pay | Source: Ambulatory Visit | Attending: Orthopedic Surgery

## 2013-07-13 ENCOUNTER — Ambulatory Visit (HOSPITAL_BASED_OUTPATIENT_CLINIC_OR_DEPARTMENT_OTHER)
Admission: RE | Admit: 2013-07-13 | Discharge: 2013-07-14 | Disposition: A | Discharge status: Home / Self Care | Payer: 59 | Source: Ambulatory Visit | Attending: Orthopedic Surgery | Admitting: Orthopedic Surgery

## 2013-07-13 ENCOUNTER — Encounter (HOSPITAL_BASED_OUTPATIENT_CLINIC_OR_DEPARTMENT_OTHER): Payer: 59 | Admitting: Certified Registered"

## 2013-07-13 ENCOUNTER — Encounter (HOSPITAL_BASED_OUTPATIENT_CLINIC_OR_DEPARTMENT_OTHER): Payer: Self-pay | Admitting: *Deleted

## 2013-07-13 ENCOUNTER — Ambulatory Visit (HOSPITAL_BASED_OUTPATIENT_CLINIC_OR_DEPARTMENT_OTHER): Payer: 59 | Admitting: Certified Registered"

## 2013-07-13 DIAGNOSIS — G4733 Obstructive sleep apnea (adult) (pediatric): Secondary | ICD-10-CM | POA: Diagnosis present

## 2013-07-13 DIAGNOSIS — C61 Malignant neoplasm of prostate: Secondary | ICD-10-CM | POA: Diagnosis present

## 2013-07-13 DIAGNOSIS — S83249A Other tear of medial meniscus, current injury, unspecified knee, initial encounter: Secondary | ICD-10-CM | POA: Diagnosis present

## 2013-07-13 DIAGNOSIS — M23329 Other meniscus derangements, posterior horn of medial meniscus, unspecified knee: Secondary | ICD-10-CM | POA: Insufficient documentation

## 2013-07-13 DIAGNOSIS — J4489 Other specified chronic obstructive pulmonary disease: Secondary | ICD-10-CM | POA: Diagnosis present

## 2013-07-13 DIAGNOSIS — M23302 Other meniscus derangements, unspecified lateral meniscus, unspecified knee: Secondary | ICD-10-CM | POA: Insufficient documentation

## 2013-07-13 DIAGNOSIS — M2240 Chondromalacia patellae, unspecified knee: Secondary | ICD-10-CM | POA: Insufficient documentation

## 2013-07-13 DIAGNOSIS — Z87891 Personal history of nicotine dependence: Secondary | ICD-10-CM | POA: Insufficient documentation

## 2013-07-13 DIAGNOSIS — I10 Essential (primary) hypertension: Secondary | ICD-10-CM | POA: Diagnosis present

## 2013-07-13 DIAGNOSIS — R0602 Shortness of breath: Secondary | ICD-10-CM | POA: Diagnosis present

## 2013-07-13 DIAGNOSIS — Z8546 Personal history of malignant neoplasm of prostate: Secondary | ICD-10-CM | POA: Insufficient documentation

## 2013-07-13 DIAGNOSIS — Z79899 Other long term (current) drug therapy: Secondary | ICD-10-CM | POA: Insufficient documentation

## 2013-07-13 DIAGNOSIS — J449 Chronic obstructive pulmonary disease, unspecified: Secondary | ICD-10-CM | POA: Insufficient documentation

## 2013-07-13 DIAGNOSIS — S83289A Other tear of lateral meniscus, current injury, unspecified knee, initial encounter: Secondary | ICD-10-CM | POA: Diagnosis present

## 2013-07-13 DIAGNOSIS — M1990 Unspecified osteoarthritis, unspecified site: Secondary | ICD-10-CM | POA: Diagnosis present

## 2013-07-13 DIAGNOSIS — K219 Gastro-esophageal reflux disease without esophagitis: Secondary | ICD-10-CM | POA: Diagnosis present

## 2013-07-13 HISTORY — PX: KNEE ARTHROSCOPY WITH MEDIAL MENISECTOMY: SHX5651

## 2013-07-13 LAB — POCT I-STAT, CHEM 8
BUN: 22 mg/dL (ref 6–23)
CHLORIDE: 106 meq/L (ref 96–112)
CREATININE: 2.4 mg/dL — AB (ref 0.50–1.35)
Calcium, Ion: 1.12 mmol/L (ref 1.12–1.23)
Glucose, Bld: 99 mg/dL (ref 70–99)
HCT: 41 % (ref 39.0–52.0)
Hemoglobin: 13.9 g/dL (ref 13.0–17.0)
POTASSIUM: 3.9 meq/L (ref 3.7–5.3)
SODIUM: 138 meq/L (ref 137–147)
TCO2: 24 mmol/L (ref 0–100)

## 2013-07-13 SURGERY — ARTHROSCOPY, KNEE, WITH MEDIAL MENISCECTOMY
Anesthesia: Regional | Site: Knee | Laterality: Right

## 2013-07-13 MED ORDER — ONDANSETRON HCL 4 MG/2ML IJ SOLN
4.0000 mg | Freq: Four times a day (QID) | INTRAMUSCULAR | Status: DC | PRN
Start: 2013-07-13 — End: 2013-07-14

## 2013-07-13 MED ORDER — FENTANYL CITRATE 0.05 MG/ML IJ SOLN
50.0000 ug | INTRAMUSCULAR | Status: DC | PRN
  Administered 2013-07-13: 100 ug via INTRAVENOUS

## 2013-07-13 MED ORDER — MIDAZOLAM HCL 2 MG/2ML IJ SOLN
INTRAMUSCULAR | Status: AC
  Filled 2013-07-13: qty 2

## 2013-07-13 MED ORDER — HYDROCODONE-ACETAMINOPHEN 5-325 MG PO TABS
1.0000 | ORAL_TABLET | ORAL | Status: DC | PRN
  Administered 2013-07-13 – 2013-07-14 (×4): 2 via ORAL
  Filled 2013-07-13 (×4): qty 2

## 2013-07-13 MED ORDER — EPINEPHRINE HCL 1 MG/ML IJ SOLN
INTRAMUSCULAR | Status: AC
  Filled 2013-07-13: qty 1

## 2013-07-13 MED ORDER — ALBUTEROL SULFATE HFA 108 (90 BASE) MCG/ACT IN AERS: 2.0000 | INHALATION_SPRAY | RESPIRATORY_TRACT | Status: DC | PRN

## 2013-07-13 MED ORDER — OXYCODONE HCL 5 MG/5ML PO SOLN: 5.0000 mg | Freq: Once | ORAL | Status: DC | PRN

## 2013-07-13 MED ORDER — DEXAMETHASONE SODIUM PHOSPHATE 4 MG/ML IJ SOLN
INTRAMUSCULAR | Status: DC | PRN
  Administered 2013-07-13: 10 mg via INTRAVENOUS

## 2013-07-13 MED ORDER — MIDAZOLAM HCL 2 MG/2ML IJ SOLN
1.0000 mg | INTRAMUSCULAR | Status: DC | PRN
  Administered 2013-07-13: 2 mg via INTRAVENOUS

## 2013-07-13 MED ORDER — CEFAZOLIN SODIUM-DEXTROSE 2-3 GM-% IV SOLR
2.0000 g | Freq: Four times a day (QID) | INTRAVENOUS | Status: AC
  Administered 2013-07-13 – 2013-07-14 (×3): 2 g via INTRAVENOUS

## 2013-07-13 MED ORDER — BUPIVACAINE-EPINEPHRINE (PF) 0.25% -1:200000 IJ SOLN
INTRAMUSCULAR | Status: AC
  Filled 2013-07-13: qty 30

## 2013-07-13 MED ORDER — CEFAZOLIN SODIUM-DEXTROSE 2-3 GM-% IV SOLR
INTRAVENOUS | Status: AC
  Filled 2013-07-13: qty 50

## 2013-07-13 MED ORDER — SUCCINYLCHOLINE CHLORIDE 20 MG/ML IJ SOLN
INTRAMUSCULAR | Status: AC
  Filled 2013-07-13: qty 1

## 2013-07-13 MED ORDER — IRBESARTAN 150 MG PO TABS: 150.0000 mg | ORAL_TABLET | Freq: Every day | ORAL | Status: DC

## 2013-07-13 MED ORDER — HYDROMORPHONE HCL PF 1 MG/ML IJ SOLN
0.2500 mg | INTRAMUSCULAR | Status: DC | PRN
  Administered 2013-07-13: 0.5 mg via INTRAVENOUS

## 2013-07-13 MED ORDER — FENTANYL CITRATE 0.05 MG/ML IJ SOLN
INTRAMUSCULAR | Status: AC
  Filled 2013-07-13: qty 2

## 2013-07-13 MED ORDER — LIDOCAINE HCL (CARDIAC) 20 MG/ML IV SOLN
INTRAVENOUS | Status: DC | PRN
  Administered 2013-07-13: 100 mg via INTRAVENOUS

## 2013-07-13 MED ORDER — FENTANYL CITRATE 0.05 MG/ML IJ SOLN
INTRAMUSCULAR | Status: AC
  Filled 2013-07-13: qty 4

## 2013-07-13 MED ORDER — METOCLOPRAMIDE HCL 5 MG/ML IJ SOLN: 5.0000 mg | Freq: Three times a day (TID) | INTRAMUSCULAR | Status: DC | PRN

## 2013-07-13 MED ORDER — LACTATED RINGERS IV SOLN
INTRAVENOUS | Status: DC
  Administered 2013-07-13: 07:00:00 via INTRAVENOUS

## 2013-07-13 MED ORDER — SODIUM CHLORIDE 0.9 % IR SOLN
Status: DC | PRN
  Administered 2013-07-13: 09:00:00

## 2013-07-13 MED ORDER — NAPROXEN 250 MG PO TABS
ORAL_TABLET | ORAL | Status: AC
  Filled 2013-07-13: qty 2

## 2013-07-13 MED ORDER — CHLORHEXIDINE GLUCONATE 4 % EX LIQD
60.0000 mL | Freq: Once | CUTANEOUS | Status: AC
  Administered 2013-07-13: 4 via TOPICAL

## 2013-07-13 MED ORDER — METOCLOPRAMIDE HCL 5 MG PO TABS: 5.0000 mg | ORAL_TABLET | Freq: Three times a day (TID) | ORAL | Status: DC | PRN

## 2013-07-13 MED ORDER — ONDANSETRON HCL 4 MG/2ML IJ SOLN: 4.0000 mg | Freq: Once | INTRAMUSCULAR | Status: DC | PRN

## 2013-07-13 MED ORDER — OXYCODONE HCL 5 MG PO TABS: 5.0000 mg | ORAL_TABLET | Freq: Once | ORAL | Status: DC | PRN

## 2013-07-13 MED ORDER — SODIUM CHLORIDE 0.9 % IV SOLN
INTRAVENOUS | Status: DC
  Administered 2013-07-13: 10 mL/h via INTRAVENOUS

## 2013-07-13 MED ORDER — HYDROCODONE-ACETAMINOPHEN 5-325 MG PO TABS
ORAL_TABLET | ORAL | Status: AC
  Filled 2013-07-13: qty 2

## 2013-07-13 MED ORDER — NAPROXEN 500 MG PO TABS
500.0000 mg | ORAL_TABLET | Freq: Two times a day (BID) | ORAL | Status: DC
  Administered 2013-07-13: 500 mg via ORAL

## 2013-07-13 MED ORDER — PROPOFOL 10 MG/ML IV BOLUS
INTRAVENOUS | Status: DC | PRN
  Administered 2013-07-13: 300 mg via INTRAVENOUS

## 2013-07-13 MED ORDER — EPHEDRINE SULFATE 50 MG/ML IJ SOLN
INTRAMUSCULAR | Status: DC | PRN
  Administered 2013-07-13 (×2): 10 mg via INTRAVENOUS

## 2013-07-13 MED ORDER — PROPOFOL 10 MG/ML IV EMUL
INTRAVENOUS | Status: AC
  Filled 2013-07-13: qty 50

## 2013-07-13 MED ORDER — ONDANSETRON HCL 4 MG/2ML IJ SOLN
INTRAMUSCULAR | Status: DC | PRN
  Administered 2013-07-13: 4 mg via INTRAVENOUS

## 2013-07-13 MED ORDER — CEFAZOLIN SODIUM 1-5 GM-% IV SOLN
INTRAVENOUS | Status: AC
  Filled 2013-07-13: qty 50

## 2013-07-13 MED ORDER — DEXTROSE 5 % IV SOLN
3.0000 g | INTRAVENOUS | Status: AC
  Administered 2013-07-13: 3 g via INTRAVENOUS

## 2013-07-13 MED ORDER — HYDROCODONE-ACETAMINOPHEN 5-325 MG PO TABS: ORAL_TABLET | ORAL | Status: DC

## 2013-07-13 MED ORDER — BUPIVACAINE-EPINEPHRINE (PF) 0.5% -1:200000 IJ SOLN
INTRAMUSCULAR | Status: DC | PRN
  Administered 2013-07-13: 20 mL

## 2013-07-13 MED ORDER — ONDANSETRON HCL 4 MG PO TABS: 4.0000 mg | ORAL_TABLET | Freq: Four times a day (QID) | ORAL | Status: DC | PRN

## 2013-07-13 MED ORDER — HYDROMORPHONE HCL PF 1 MG/ML IJ SOLN
INTRAMUSCULAR | Status: AC
  Filled 2013-07-13: qty 1

## 2013-07-13 SURGICAL SUPPLY — 42 items
BANDAGE ELASTIC 6 VELCRO ST LF (GAUZE/BANDAGES/DRESSINGS) ×2 IMPLANT
BLADE 15 SAFETY STRL DISP (BLADE) IMPLANT
BLADE CUTTER GATOR 3.5 (BLADE) ×2 IMPLANT
BLADE GREAT WHITE 4.2 (BLADE) IMPLANT
BNDG COHESIVE 4X5 TAN STRL (GAUZE/BANDAGES/DRESSINGS) IMPLANT
CANISTER SUCT 3000ML (MISCELLANEOUS) IMPLANT
DRAPE ARTHROSCOPY W/POUCH 90 (DRAPES) ×2 IMPLANT
DURAPREP 26ML APPLICATOR (WOUND CARE) ×2 IMPLANT
GAUZE SPONGE 4X4 12PLY STRL (GAUZE/BANDAGES/DRESSINGS) ×2 IMPLANT
GAUZE XEROFORM 1X8 LF (GAUZE/BANDAGES/DRESSINGS) ×2 IMPLANT
GLOVE BIO SURGEON STRL SZ7 (GLOVE) ×2 IMPLANT
GLOVE BIOGEL PI IND STRL 7.0 (GLOVE) ×1 IMPLANT
GLOVE BIOGEL PI IND STRL 7.5 (GLOVE) ×1 IMPLANT
GLOVE BIOGEL PI INDICATOR 7.0 (GLOVE) ×2
GLOVE BIOGEL PI INDICATOR 7.5 (GLOVE) ×1
GLOVE ECLIPSE 6.5 STRL STRAW (GLOVE) ×2 IMPLANT
GLOVE EXAM NITRILE LRG STRL (GLOVE) ×1 IMPLANT
GLOVE SS BIOGEL STRL SZ 7.5 (GLOVE) ×1 IMPLANT
GLOVE SUPERSENSE BIOGEL SZ 7.5 (GLOVE) ×1
GOWN STRL REUS W/ TWL LRG LVL3 (GOWN DISPOSABLE) ×1 IMPLANT
GOWN STRL REUS W/TWL LRG LVL3 (GOWN DISPOSABLE) ×2
HOLDER KNEE FOAM BLUE (MISCELLANEOUS) ×2 IMPLANT
KNEE WRAP E Z 3 GEL PACK (MISCELLANEOUS) ×2 IMPLANT
MANIFOLD NEPTUNE II (INSTRUMENTS) IMPLANT
NDL SAFETY ECLIPSE 18X1.5 (NEEDLE) ×1 IMPLANT
NEEDLE HYPO 18GX1.5 SHARP (NEEDLE) ×2
NEEDLE HYPO 22GX1.5 SAFETY (NEEDLE) IMPLANT
PACK ARTHROSCOPY DSU (CUSTOM PROCEDURE TRAY) ×2 IMPLANT
PACK BASIN DAY SURGERY FS (CUSTOM PROCEDURE TRAY) ×2 IMPLANT
PAD ALCOHOL SWAB (MISCELLANEOUS) ×1 IMPLANT
SET ARTHROSCOPY TUBING (MISCELLANEOUS) ×2
SET ARTHROSCOPY TUBING LN (MISCELLANEOUS) ×1 IMPLANT
SUCTION FRAZIER TIP 10 FR DISP (SUCTIONS) IMPLANT
SUT ETHILON 4 0 PS 2 18 (SUTURE) ×2 IMPLANT
SUT PROLENE 3 0 PS 2 (SUTURE) IMPLANT
SUT VIC AB 3-0 PS1 18 (SUTURE)
SUT VIC AB 3-0 PS1 18XBRD (SUTURE) IMPLANT
SYR 20CC LL (SYRINGE) IMPLANT
SYR 5ML LL (SYRINGE) ×2 IMPLANT
TOWEL OR 17X24 6PK STRL BLUE (TOWEL DISPOSABLE) ×2 IMPLANT
WAND STAR VAC 90 (SURGICAL WAND) IMPLANT
WATER STERILE IRR 1000ML POUR (IV SOLUTION) ×2 IMPLANT

## 2013-07-13 NOTE — Anesthesia Preprocedure Evaluation (Signed)
Anesthesia Evaluation  Patient identified by MRN, date of birth, ID band Patient awake    Reviewed: Allergy & Precautions, H&P , NPO status , Patient's Chart, lab work & pertinent test results  Airway       Dental   Pulmonary sleep apnea (No CPAP use at home) , COPD COPD inhaler, former smoker,          Cardiovascular hypertension, Pt. on medications     Neuro/Psych    GI/Hepatic GERD-  Medicated and Controlled,  Endo/Other  Morbid obesity  Renal/GU      Musculoskeletal   Abdominal   Peds  Hematology   Anesthesia Other Findings   Reproductive/Obstetrics                           Anesthesia Physical Anesthesia Plan  ASA: III  Anesthesia Plan: General   Post-op Pain Management:    Induction: Intravenous  Airway Management Planned: LMA  Additional Equipment:   Intra-op Plan:   Post-operative Plan: Extubation in OR  Informed Consent: I have reviewed the patients History and Physical, chart, labs and discussed the procedure including the risks, benefits and alternatives for the proposed anesthesia with the patient or authorized representative who has indicated his/her understanding and acceptance.   Dental advisory given  Plan Discussed with: CRNA, Anesthesiologist and Surgeon  Anesthesia Plan Comments:         Anesthesia Quick Evaluation

## 2013-07-13 NOTE — Anesthesia Postprocedure Evaluation (Signed)
  Anesthesia Post-op Note  Patient: Thomas [name]  Procedure(s) Performed: Procedure(s): RIGHT KNEE ARTHROSCOPY WITH MEDIAL AND LATERAL MENISCECTOMY (Right)  Patient Location: PACU  Anesthesia Type:General  Level of Consciousness: awake, alert  and oriented  Airway and Oxygen Therapy: Patient Spontanous Breathing and Patient connected to face mask oxygen  Post-op Pain: mild  Post-op Assessment: Post-op Vital signs reviewed  Post-op Vital Signs: Reviewed  Last Vitals:  Filed Vitals:   07/13/13 0930  BP: 143/66  Pulse: 85  Temp:   Resp: 15    Complications: No apparent anesthesia complications

## 2013-07-13 NOTE — Interval H&P Note (Signed)
History and Physical Interval Note:  07/13/2013 8:17 AM  Thomas [name]  has presented today for surgery, with the diagnosis of RIGHT KNEE MEDIAL MENISCUS TEAR   The various methods of treatment have been discussed with the patient and family. After consideration of risks, benefits and other options for treatment, the patient has consented to  Procedure(s): RIGHT KNEE ARTHROSCOPY WITH MEDIAL MENISCECTOMY (Right) as a surgical intervention .  The patient's history has been reviewed, patient examined, no change in status, stable for surgery.  I have reviewed the patient's chart and labs.  Questions were answered to the patient's satisfaction.     Lorn Junes

## 2013-07-13 NOTE — Anesthesia Procedure Notes (Addendum)
Procedure Name: LMA Insertion Date/Time: 07/13/2013 8:30 AM Performed by: Baxter Flattery Pre-anesthesia Checklist: Patient identified, Emergency Drugs available, Suction available and Patient being monitored Patient Re-evaluated:Patient Re-evaluated prior to inductionOxygen Delivery Method: Circle System Utilized Preoxygenation: Pre-oxygenation with 100% oxygen Intubation Type: IV induction Ventilation: Mask ventilation without difficulty LMA: LMA inserted LMA Size: 4.0 Number of attempts: 1 Airway Equipment and Method: bite block Placement Confirmation: positive ETCO2 and breath sounds checked- equal and bilateral Tube secured with: Tape Dental Injury: Teeth and Oropharynx as per pre-operative assessment     Right knee intraarticular injection after sterile prep,  22g needle, Tolerated we;\ll through 2 inferior ports.

## 2013-07-13 NOTE — Progress Notes (Signed)
Assisted Dr. Al Corpus with right, knee block. Side rails up, monitors on throughout procedure. See vital signs in flow sheet. Tolerated Procedure well.

## 2013-07-13 NOTE — H&P (View-Only) (Signed)
Thomas [name] is an 53 y.o. male.   Chief Complaint: right knee pain HPI: Thomas [name] is a 53 year old seen for follow-up from his right knee medial and lateral meniscal tears with medial compartment chondromalacia. He continues to have significant pain that has been bothering him for 5-6 months. He had an MRI on 03/25/13 that showed meniscal tearing with medial compartment chondral defect. He has tried to work. He has pain on a daily basis, pain with weightbearing and activity getting progressively worse.    Past Medical History  Diagnosis Date  . Hypertension   . COPD (chronic obstructive pulmonary disease)   . Shortness of breath     "can happen at anytime" (08/25/2012)  . GERD (gastroesophageal reflux disease)   . Arthritis     "touch in my neck" (08/25/2012)  . Kidney stones     "passed on their own" (08/25/2012)  . Prostate cancer   . Thyroid nodule     bilaterally/notes 08/25/2012  . OSA (obstructive sleep apnea)     "cannot sleep w/mask" (08/25/2012)  . Medial meniscus tear   . Lateral meniscus tear     Past Surgical History  Procedure Laterality Date  . Nasal sinus surgery  01/2009  . Robot assisted laparoscopic radical prostatectomy  2006  . Back surgery  1990s  . Thyroidectomy, partial Left 08/25/2012  . Lumbar disc surgery  1990's    "had 2 ruptured discs" (08/25/2012)  . Thyroidectomy Left 08/25/2012    Procedure: LEFT PARTIAL THYROIDECTOMY;  Surgeon: Sui W [name], MD;  Location: [facility] OR;  Service: ENT;  Laterality: Left;    Family History  Problem Relation Age of Onset  . Aneurysm Mother   . Heart attack Father    Social History:  reports that he quit smoking about 20 years ago. His smoking use included Cigarettes. He has a 45 pack-year smoking history. He has never used smokeless tobacco. He reports that he drinks about 8.4 ounces of alcohol per week. He reports that he does not use illicit drugs.  Allergies: No Known Allergies Current Outpatient Prescriptions on File  Prior to Visit  Medication Sig Dispense Refill  . albuterol (PROVENTIL HFA;VENTOLIN HFA) 108 (90 BASE) MCG/ACT inhaler Inhale 2 puffs into the lungs every 4 (four) hours as needed.        . meloxicam (MOBIC) 15 MG tablet Take 15 mg by mouth daily.      . naproxen (NAPROSYN) 500 MG tablet Take 500 mg by mouth daily as needed. Pain      . olmesartan (BENICAR) 20 MG tablet Take 20 mg by mouth daily.        . oxyCODONE-acetaminophen (ROXICET) 5-325 MG per tablet Take 1-2 tablets by mouth every 4 (four) hours as needed for pain.  30 tablet  0  . traMADol (ULTRAM) 50 MG tablet Take 50 mg by mouth every 6 (six) hours as needed for pain.       Current Facility-Administered Medications on File Prior to Visit  Medication Dose Route Frequency Provider Last Rate Last Dose  . fentaNYL (SUBLIMAZE) injection 50-100 mcg  50-100 mcg Intravenous PRN William E Fitzgerald, MD      . [START ON 07/13/2013] lactated ringers infusion   Intravenous Continuous William E Fitzgerald, MD      . midazolam (VERSED) injection 1-2 mg  1-2 mg Intravenous PRN William E Fitzgerald, MD         (Not in a hospital admission)  Results for orders placed   during the hospital encounter of 07/12/13 (from the past 48 hour(s))  BASIC METABOLIC PANEL     Status: Abnormal   Collection Time    07/12/13  9:30 AM      Result Value Ref Range   Sodium 142  137 - 147 mEq/L   Potassium 4.8  3.7 - 5.3 mEq/L   Chloride 103  96 - 112 mEq/L   CO2 28  19 - 32 mEq/L   Glucose, Bld 102 (*) 70 - 99 mg/dL   BUN 22  6 - 23 mg/dL   Creatinine, Ser 2.43 (*) 0.50 - 1.35 mg/dL   Calcium 10.2  8.4 - 10.5 mg/dL   GFR calc non Af Amer 29 (*) >90 mL/min   GFR calc Af Amer 33 (*) >90 mL/min   Comment: (NOTE)     The eGFR has been calculated using the CKD EPI equation.     This calculation has not been validated in all clinical situations.     eGFR's persistently <90 mL/min signify possible Chronic Kidney     Disease.   No results found.  Review of  Systems  Constitutional: Negative.   HENT: Negative.   Eyes: Negative.   Respiratory: Negative.   Cardiovascular: Negative.   Gastrointestinal: Negative.   Genitourinary: Negative.   Musculoskeletal: Positive for joint pain.       Right knee pain  Neurological: Negative.   Endo/Heme/Allergies: Negative.   Psychiatric/Behavioral: Negative.     Blood pressure 130/90, height 5' 10" (1.778 m), weight 120.203 kg (265 lb). Physical Exam  Constitutional: He is oriented to person, place, and time. He appears well-developed and well-nourished.  HENT:  Head: Normocephalic and atraumatic.  Eyes: Conjunctivae are normal. Pupils are equal, round, and reactive to light.  Neck: Neck supple.  Cardiovascular: Normal rate.   Respiratory: Effort normal.  GI: Soft.  Musculoskeletal:  Examination of his right knee reveals pain on the medial joint line positive medial McMurray's, 1+ effusion full range of motion knee is stable with normal patella tracking. Exam of the left knee reveals full range of motion without pain swelling weakness or instability. Vascular exam: pulses 2+ and symmetric.  Neurological: He is alert and oriented to person, place, and time.  Skin: Skin is warm and dry.  Psychiatric: He has a normal mood and affect.     Assessment Patient Active Problem List   Diagnosis Date Noted  . Hypertension   . COPD (chronic obstructive pulmonary disease)   . GERD (gastroesophageal reflux disease)   . Arthritis   . Kidney stones   . Prostate cancer   . Thyroid nodule   . OSA (obstructive sleep apnea)   . Medial meniscus tear   . Lateral meniscus tear   . OBSTRUCTIVE SLEEP APNEA 11/29/2009  . HYPERSOMNIA 09/26/2009  . DYSPNEA 09/26/2009  . PROSTATE CANCER, HX OF 09/26/2009  . HYPERTENSION 09/25/2009   Plan I talk to him and his wife about this in detail. I recommend with these findings and his significant persistent pain that we proceed with right knee arthroscopy with attention to  his meniscal and chondral pathology. Discussed risks benefits and possible complications of surgery in detail and they understand this completely.  He will be out of work for 6-8 weeks   Clara Smolen J Tandre Conly 07/12/2013, 11:55 AM    

## 2013-07-13 NOTE — Transfer of Care (Signed)
Immediate Anesthesia Transfer of Care Note  Patient: Thomas [name]  Procedure(s) Performed: Procedure(s): RIGHT KNEE ARTHROSCOPY WITH MEDIAL AND LATERAL MENISCECTOMY (Right)  Patient Location: PACU  Anesthesia Type:GA combined with regional for post-op pain  Level of Consciousness: awake, alert  and oriented  Airway & Oxygen Therapy: Patient Spontanous Breathing and Patient connected to face mask oxygen  Post-op Assessment: Report given to PACU RN, Post -op Vital signs reviewed and stable and Patient moving all extremities  Post vital signs: Reviewed and stable  Complications: No apparent anesthesia complications

## 2013-07-14 ENCOUNTER — Encounter (HOSPITAL_BASED_OUTPATIENT_CLINIC_OR_DEPARTMENT_OTHER): Payer: Self-pay | Admitting: Orthopedic Surgery

## 2013-07-14 NOTE — Op Note (Signed)
NAMETIMOTHEE, GALI NO.:  [id number]  MEDICAL RECORD NO.:  [id number]  LOCATION:                                 FACILITY:  PHYSICIAN:  Audree Camel. Noemi [name], M.D. DATE OF BIRTH:  Apr 01, 1960  DATE OF PROCEDURE:  07/13/2013 DATE OF DISCHARGE:  07/14/2013                              OPERATIVE REPORT   PREOPERATIVE DIAGNOSIS:  Right knee medial and lateral meniscal tears with chondromalacia.  POSTOPERATIVE DIAGNOSIS:  Right knee medial and lateral meniscal tears with chondromalacia.  PROCEDURE:  Right knee examination under anesthesia followed by arthroscopic partial medial and lateral meniscectomies with chondroplasty.  SURGEON:  Audree Camel. Noemi [name], M.D.  ANESTHESIA:  General.  OPERATIVE TIME:  30 minutes.  COMPLICATIONS:  None.  INDICATION FOR PROCEDURE:  Thomas [name] is a 53 year old gentleman who has had significant right knee pain for the past 6 months, increasing in nature with exam and MRI documenting meniscal tearing and chondromalacia.  He has failed conservative care and is now to undergo arthroscopy.  DESCRIPTION:  Thomas [name] was brought to the operating room on Jul 13, 2013, after a knee block was placed in the holding room by Anesthesia. He was placed on the operative table in a supine position.  He received antibiotics preoperatively for prophylaxis.  After being placed under general anesthesia, his right knee was examined.  He had full range of motion.  Knee was stable to ligamentous exam with normal patellar tracking.  The right leg was prepped using sterile DuraPrep and draped using sterile technique.  Time-out procedure was called and the correct right knee identified.  Initially, through an anterolateral portal, the arthroscope with a pump attached was placed into an anteromedial portal, and an arthroscopic probe was placed.  On initial inspection of the medial compartment, he was found to have 75% and grade 3 chondromalacia, which was  debrided.  Medial meniscus showed tearing in the posterior medial horn of which 30% to 40% was resected back to a stable rim. Intercondylar notch inspected.  Anterior and posterior cruciate ligaments were normal.  Lateral compartment inspected.  He had grade 1 to 2 chondromalacia.  Lateral meniscus showed partial tearing 20% posterolateral corner, which was resected back to a stable rim. Patellofemoral joint showed 50% grade 3 chondromalacia, which was debrided.  The patella tracked normally.  Medial and lateral gutters were free of pathology.  After this was done, it was felt that all pathology have been satisfactorily addressed.  The instruments removed. Portals were closed with 3-0 nylon suture.  Sterile dressings were applied.  The patient was awakened and taken to the recovery in a stable condition.  FOLLOWUP CARE:  Thomas [name] will be followed overnight for observation due to his significant sleep apnea.  He will be discharged tomorrow on hydrocodone for pain.  Seen back in office in a week for sutures out and followup.     Thomas [name], M.D.   ______________________________ Audree Camel. Noemi [name], M.D.    RAW/MEDQ  D:  07/13/2013  T:  07/13/2013  Job:  [id number]

## 2015-10-17 ENCOUNTER — Inpatient Hospital Stay (HOSPITAL_COMMUNITY): Payer: Self-pay

## 2015-10-17 ENCOUNTER — Inpatient Hospital Stay (HOSPITAL_COMMUNITY)
Admission: AD | Admit: 2015-10-17 | Discharge: 2015-10-18 | DRG: 684 | Disposition: A | Discharge status: Home / Self Care | Payer: Self-pay | Source: Ambulatory Visit | Attending: Internal Medicine | Admitting: Internal Medicine

## 2015-10-17 ENCOUNTER — Other Ambulatory Visit: Payer: Self-pay | Admitting: Internal Medicine

## 2015-10-17 ENCOUNTER — Encounter (HOSPITAL_COMMUNITY): Payer: Self-pay

## 2015-10-17 DIAGNOSIS — Z8546 Personal history of malignant neoplasm of prostate: Secondary | ICD-10-CM

## 2015-10-17 DIAGNOSIS — Z87891 Personal history of nicotine dependence: Secondary | ICD-10-CM

## 2015-10-17 DIAGNOSIS — Z87442 Personal history of urinary calculi: Secondary | ICD-10-CM

## 2015-10-17 DIAGNOSIS — J449 Chronic obstructive pulmonary disease, unspecified: Secondary | ICD-10-CM | POA: Diagnosis present

## 2015-10-17 DIAGNOSIS — K219 Gastro-esophageal reflux disease without esophagitis: Secondary | ICD-10-CM | POA: Diagnosis present

## 2015-10-17 DIAGNOSIS — Z8249 Family history of ischemic heart disease and other diseases of the circulatory system: Secondary | ICD-10-CM

## 2015-10-17 DIAGNOSIS — G4733 Obstructive sleep apnea (adult) (pediatric): Secondary | ICD-10-CM | POA: Diagnosis present

## 2015-10-17 DIAGNOSIS — N179 Acute kidney failure, unspecified: Principal | ICD-10-CM | POA: Diagnosis present

## 2015-10-17 DIAGNOSIS — D649 Anemia, unspecified: Secondary | ICD-10-CM | POA: Diagnosis present

## 2015-10-17 DIAGNOSIS — N183 Chronic kidney disease, stage 3 (moderate): Secondary | ICD-10-CM | POA: Diagnosis present

## 2015-10-17 DIAGNOSIS — E890 Postprocedural hypothyroidism: Secondary | ICD-10-CM | POA: Diagnosis present

## 2015-10-17 DIAGNOSIS — N270 Small kidney, unilateral: Secondary | ICD-10-CM | POA: Diagnosis present

## 2015-10-17 DIAGNOSIS — E861 Hypovolemia: Secondary | ICD-10-CM | POA: Diagnosis present

## 2015-10-17 DIAGNOSIS — I129 Hypertensive chronic kidney disease with stage 1 through stage 4 chronic kidney disease, or unspecified chronic kidney disease: Secondary | ICD-10-CM | POA: Diagnosis present

## 2015-10-17 DIAGNOSIS — E875 Hyperkalemia: Secondary | ICD-10-CM | POA: Diagnosis present

## 2015-10-17 LAB — COMPREHENSIVE METABOLIC PANEL
ALK PHOS: 39 U/L (ref 38–126)
ALT: 30 U/L (ref 17–63)
ANION GAP: 9 (ref 5–15)
AST: 23 U/L (ref 15–41)
Albumin: 4.4 g/dL (ref 3.5–5.0)
BUN: 67 mg/dL — ABNORMAL HIGH (ref 6–20)
CALCIUM: 9.3 mg/dL (ref 8.9–10.3)
CO2: 23 mmol/L (ref 22–32)
Chloride: 107 mmol/L (ref 101–111)
Creatinine, Ser: 6.23 mg/dL — ABNORMAL HIGH (ref 0.61–1.24)
GFR, EST AFRICAN AMERICAN: 11 mL/min — AB (ref >60)
GFR, EST NON AFRICAN AMERICAN: 9 mL/min — AB (ref >60)
GLUCOSE: 101 mg/dL — AB (ref 65–99)
POTASSIUM: 5.5 mmol/L — AB (ref 3.5–5.1)
Sodium: 139 mmol/L (ref 135–145)
TOTAL PROTEIN: 8.6 g/dL — AB (ref 6.5–8.1)
Total Bilirubin: 0.9 mg/dL (ref 0.3–1.2)

## 2015-10-17 LAB — URINALYSIS, ROUTINE W REFLEX MICROSCOPIC
BILIRUBIN URINE: NEGATIVE
Glucose, UA: NEGATIVE mg/dL
Ketones, ur: NEGATIVE mg/dL
Leukocytes, UA: NEGATIVE
NITRITE: NEGATIVE
PROTEIN: 30 mg/dL — AB
SPECIFIC GRAVITY, URINE: 1.015 (ref 1.005–1.030)
pH: 6 (ref 5.0–8.0)

## 2015-10-17 LAB — URINE MICROSCOPIC-ADD ON

## 2015-10-17 LAB — CBC
HEMATOCRIT: 43.9 % (ref 39.0–52.0)
Hemoglobin: 14.8 g/dL (ref 13.0–17.0)
MCH: 30.8 pg (ref 26.0–34.0)
MCHC: 33.7 g/dL (ref 30.0–36.0)
MCV: 91.3 fL (ref 78.0–100.0)
PLATELETS: 318 10*3/uL (ref 150–400)
RBC: 4.81 MIL/uL (ref 4.22–5.81)
RDW: 13.8 % (ref 11.5–15.5)
WBC: 8 10*3/uL (ref 4.0–10.5)

## 2015-10-17 LAB — MAGNESIUM: Magnesium: 2.1 mg/dL (ref 1.7–2.4)

## 2015-10-17 LAB — PHOSPHORUS: PHOSPHORUS: 5.9 mg/dL — AB (ref 2.5–4.6)

## 2015-10-17 LAB — SODIUM, URINE, RANDOM: Sodium, Ur: 96 mmol/L

## 2015-10-17 LAB — CREATININE, URINE, RANDOM: Creatinine, Urine: 112.3 mg/dL

## 2015-10-17 MED ORDER — SODIUM CHLORIDE 0.9 % IV SOLN
INTRAVENOUS | Status: DC
  Administered 2015-10-18 (×2): via INTRAVENOUS

## 2015-10-17 MED ORDER — ENOXAPARIN SODIUM 30 MG/0.3ML ~~LOC~~ SOLN
30.0000 mg | SUBCUTANEOUS | Status: DC
  Filled 2015-10-17: qty 0.3

## 2015-10-17 MED ORDER — ONDANSETRON HCL 4 MG/2ML IJ SOLN: 4.0000 mg | Freq: Four times a day (QID) | INTRAMUSCULAR | Status: DC | PRN

## 2015-10-17 MED ORDER — ONDANSETRON HCL 4 MG PO TABS: 4.0000 mg | ORAL_TABLET | Freq: Four times a day (QID) | ORAL | Status: DC | PRN

## 2015-10-17 NOTE — Consult Note (Addendum)
RAYVEN UGOLINI MRN: [id number] DOB/AGE: 07-28-1960 55 y.o. Primary Care Physician:FAGAN,ROY, MD Admit date: 10/17/2015 Chief Complaint: No chief complaint on file.  HPI:Pt is  55 year old male with past medical hx of HTN who was sent to ER with c/o abnormal labs.  HPI dates back to past few days when pt started feeling pain in left flank. Pt later passed stone last week. Pt says during his bout with the stone he was not putting out much . Pt later started having increased frequency of micturition. Pt also had diarrhea Pt said he continued to take his anti HTN meds NO c/o syncope NO c/o fever/cough/chills NO c/o hematuria NO c/o change in speech and vision   Past Medical History:  Diagnosis Date  . Arthritis    "touch in my neck" (09-18-12)  . COPD (chronic obstructive pulmonary disease) ([place])   . GERD (gastroesophageal reflux disease)   . Hypertension   . Kidney stones    "passed on their own" (09-18-12)  . Lateral meniscus tear   . Medial meniscus tear   . OSA (obstructive sleep apnea)    "cannot sleep w/mask" (18-Sep-2012)  . Prostate cancer ([place])   . Shortness of breath    "can happen at anytime" (09-18-2012)  . Thyroid nodule    bilaterally/notes Sep 18, 2012        Family History  Problem Relation Age of Onset  . Aneurysm Mother   . Heart attack Father     Social History:  reports that he quit smoking about 22 years ago. His smoking use included Cigarettes. He has a 45.00 pack-year smoking history. He quit smokeless tobacco use about 22 years ago. He reports that he drinks about 8.4 oz of alcohol per week . He reports that he does not use drugs.   Allergies: No Known Allergies  Medications Prior to Admission  Medication Sig Dispense Refill  . albuterol (PROVENTIL HFA;VENTOLIN HFA) 108 (90 BASE) MCG/ACT inhaler Inhale 2 puffs into the lungs every 4 (four) hours as needed.      Marland Kitchen HYDROcodone-acetaminophen (NORCO) 5-325 MG per tablet 1-2 tablets every 4-6 hrs as  needed for pain 40 tablet 0  . naproxen (NAPROSYN) 500 MG tablet Take 500 mg by mouth daily as needed. Pain    . olmesartan (BENICAR) 20 MG tablet Take 20 mg by mouth daily.           [id number] from the symptoms mentioned above,there are no other symptoms referable to all systems reviewed.  . enoxaparin (LOVENOX) injection  30 mg Subcutaneous Q24H      Physical Exam: Vital signs in last 24 hours: Temp:  [99.4 F (37.4 C)] 99.4 F (37.4 C) (08/16 1402) Pulse Rate:  [97] 97 (08/16 1402) BP: (141)/(89) 141/89 (08/16 1402) SpO2:  [100 %] 100 % (08/16 1402) Weight:  [260 lb 12.8 oz (118.3 kg)] 260 lb 12.8 oz (118.3 kg) (08/16 1402) Weight change:     Intake/Output from previous day: No intake/output data recorded. No intake/output data recorded.   Physical Exam: General- pt is awake,alert, oriented to time place and person Resp- No acute REsp distress, CTA B/L NO Rhonchi CVS- S1S2 regular in rate and rhythm GIT- BS+, soft, NT, ND, obese EXT- NO LE Edema, Cyanosis CNS- CN 2-12 grossly intact. Moving all 4 extremities Psych- normal mood and affect    Lab Results: CBC  Recent Labs  10/17/15 1357  WBC 8.0  HGB 14.8  HCT 43.9  PLT 318    BMET  Recent Labs  10/17/15 1357  NA 139  K 5.5*  CL 107  CO2 23  GLUCOSE 101*  BUN 67*  CREATININE 6.23*  CALCIUM 9.3    MICRO No results found for this or any previous visit (from the past 240 hour(s)).    Lab Results  Component Value Date   CALCIUM 9.3 10/17/2015   CAION 1.12 07/13/2013   PHOS 5.9 (H) 10/17/2015      Impression: 1)Renal  AKI secondary to Prerenal/ATN/Post renal                AKI sec to Hypovolemia/ RAAS blockers                AKI on CKD               CKD stage 3.               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 Secondary Hyperparathyroidism w/u pending . Phosphorus High  Vitamin 25-OH will check   5)Urology- Hx of nephrolithaisis Pt had had 10-15 stones during past few years Primary MD following  6)Electrolytes Normokalemic NOrmonatremic   7)Acid base Co2 at goal     Plan:  Will ask for FENA Will ask for REnal U/s will ask for autoimmune work up Agree with IVF Agree with holding  arb Will follow Bmet. I had extensive discussion with pt about his kidney related issues. I tried to answer pt queries to best of my ability      [place] S 10/17/2015, 2:45 PM

## 2015-10-18 LAB — BASIC METABOLIC PANEL
ANION GAP: 6 (ref 5–15)
BUN: 53 mg/dL — ABNORMAL HIGH (ref 6–20)
CALCIUM: 8.7 mg/dL — AB (ref 8.9–10.3)
CO2: 23 mmol/L (ref 22–32)
Chloride: 111 mmol/L (ref 101–111)
Creatinine, Ser: 4.34 mg/dL — ABNORMAL HIGH (ref 0.61–1.24)
GFR, EST AFRICAN AMERICAN: 16 mL/min — AB (ref >60)
GFR, EST NON AFRICAN AMERICAN: 14 mL/min — AB (ref >60)
Glucose, Bld: 99 mg/dL (ref 65–99)
POTASSIUM: 5.3 mmol/L — AB (ref 3.5–5.1)
SODIUM: 140 mmol/L (ref 135–145)

## 2015-10-18 LAB — PROTEIN ELECTROPHORESIS, SERUM
A/G Ratio: 1.1 (ref 0.7–1.7)
Albumin ELP: 3.9 g/dL (ref 2.9–4.4)
Alpha-1-Globulin: 0.3 g/dL (ref 0.0–0.4)
Alpha-2-Globulin: 1.1 g/dL — ABNORMAL HIGH (ref 0.4–1.0)
Beta Globulin: 1.1 g/dL (ref 0.7–1.3)
Gamma Globulin: 1.1 g/dL (ref 0.4–1.8)
Globulin, Total: 3.6 g/dL (ref 2.2–3.9)
Total Protein ELP: 7.5 g/dL (ref 6.0–8.5)

## 2015-10-18 LAB — HEPATITIS PANEL, ACUTE
HCV Ab: <0.1 s/co ratio (ref 0.0–0.9)
Hep A IgM: NEGATIVE
Hep B C IgM: NEGATIVE
Hepatitis B Surface Ag: NEGATIVE

## 2015-10-18 LAB — MPO/PR-3 (ANCA) ANTIBODIES
ANCA Proteinase 3: <3.5 U/mL (ref 0.0–3.5)
Myeloperoxidase Abs: <9 U/mL (ref 0.0–9.0)

## 2015-10-18 LAB — C4 COMPLEMENT: Complement C4, Body Fluid: 39 mg/dL (ref 14–44)

## 2015-10-18 LAB — ANCA TITERS
Atypical P-ANCA titer: <1:20 {titer}
C-ANCA: <1:20 {titer}
P-ANCA: <1:20 {titer}

## 2015-10-18 LAB — GLOMERULAR BASEMENT MEMBRANE ANTIBODIES: GBM Ab: 3 units (ref 0–20)

## 2015-10-18 LAB — ANTI-DNA ANTIBODY, DOUBLE-STRANDED: ds DNA Ab: <1 IU/mL (ref 0–9)

## 2015-10-18 LAB — COMPLEMENT, TOTAL: Compl, Total (CH50): >60 U/mL — ABNORMAL HIGH (ref 42–60)

## 2015-10-18 LAB — ANTINUCLEAR ANTIBODIES, IFA: ANA Ab, IFA: NEGATIVE

## 2015-10-18 LAB — C3 COMPLEMENT: C3 Complement: 155 mg/dL (ref 82–167)

## 2015-10-18 NOTE — Care Management Note (Signed)
Case Management Note  Patient Details  Name: Thomas [name] MRN: [id number] Date of Birth: 29-Jan-1961  Subjective/Objective:                  Admitted with AKI. Pt lives at home and is ind with ADL's. He has no insurance but has PCP - Dr. Willey Blade, he pays OOP for visits. He is on one chronic medications, receives it through the mail and has no difficulty affording it. He will be [facility] to [facility] for assistance with cost of hospitalization. May need assistance with cost of medication if put on something new at DC.   Action/Plan: Will cont to follow.   Expected Discharge Date:     10/20/2015             Expected Discharge Plan:  Home/Self Care  In-House Referral:  Financial Counselor  Discharge planning Services  CM Consult  Post Acute Care Choice:  NA Choice offered to:  NA  DME Arranged:    DME Agency:     HH Arranged:    HH Agency:     Status of Service:  In process, will continue to follow  If discussed at Long Length of Stay Meetings, dates discussed:    Additional Comments:  Sherald Barge, RN 10/18/2015, 9:36 AM

## 2015-10-18 NOTE — H&P (Signed)
PCP:   Asencion Noble, MD   Chief Complaint:  Cramping throughout body.  HPI: This patient is a 55 year old male who presented to the office on Tuesday prior to being admitted on Wednesday. He complained of cramping in his trunk and in his arms and legs. On the Thursday before he had begun feeling left flank pain consistent with renal colic. He describes passing less urine. He developed watery diarrhea for 2 days which has resolved. He ate and drank very little. He passed the stone on Monday at home. He is not experiencing gross hematuria. He has a history of chronic kidney disease stage III with a most recent creatinine of 1.9. This was felt to be related to use of naproxen which was previously discontinued. He was sent for a metabolic profile which returned Wednesday morning revealing a creatinine of 8.8 with a BUN in the 80s. He was therefore hospitalized for evaluation and treatment of acute renal failure.    Past Medical History:  Diagnosis Date  . Arthritis    "touch in my neck" (September 20, 2012)  . COPD (chronic obstructive pulmonary disease) ([place])   . GERD (gastroesophageal reflux disease)   . Hypertension   . Kidney stones    "passed on their own" (2012-09-20)  . Lateral meniscus tear   . Medial meniscus tear   . OSA (obstructive sleep apnea)    "cannot sleep w/mask" (09-20-2012)  . Prostate cancer ([place])   . Shortness of breath    "can happen at anytime" (09-20-2012)  . Thyroid nodule    bilaterally/notes Sep 20, 2012     Past Surgical History:  Procedure Laterality Date  . BACK SURGERY  1990s  . KNEE ARTHROSCOPY WITH MEDIAL MENISECTOMY Right 07/13/2013   Procedure: RIGHT KNEE ARTHROSCOPY WITH MEDIAL AND LATERAL MENISCECTOMY;  Surgeon: Lorn Junes, MD;  Location: [facility];  Service: Orthopedics;  Laterality: Right;  . LUMBAR DISC SURGERY  1990's   "had 2 ruptured discs" (2012-09-20)  . NASAL SINUS SURGERY  01/2009  . ROBOT ASSISTED  LAPAROSCOPIC RADICAL PROSTATECTOMY  2006  . THYROIDECTOMY Left 09/20/2012   Procedure: LEFT PARTIAL THYROIDECTOMY;  Surgeon: Ascencion Dike, MD;  Location: [facility];  Service: ENT;  Laterality: Left;  . THYROIDECTOMY, PARTIAL Left 09-20-2012     Prior to Admission medications   Medication Sig Start Date End Date Taking? Authorizing Provider  losartan (COZAAR) 100 MG tablet Take 100 mg by mouth daily. 10/12/15  Yes Historical Provider, MD  HYDROcodone-acetaminophen (NORCO) 5-325 MG per tablet 1-2 tablets every 4-6 hrs as needed for pain Patient not taking: Reported on 10/17/2015 07/13/13   Kirstin Shepperson, PA-C      Allergies:  No Known Allergies   Social History:  reports that he quit smoking about 22 years ago. His smoking use included Cigarettes. He has a 45.00 pack-year smoking history. He quit smokeless tobacco use about 22 years ago. He reports that he drinks about 8.4 oz of alcohol per week . He reports that he does not use drugs.  Family History  Problem Relation Age of Onset  . Aneurysm Mother   . Heart attack Father     Review of Systems: He has had nausea but no vomiting. He denies fever, chest pain, abdominal pain, pain on urination, gross hematuria, leg swelling or shortness of breath. '@NORROS' @  Physical Exam: Blood pressure (!) 136/93, pulse 86, temperature 98.3 F (36.8  C), temperature source Oral, resp. rate 18, height '5\' 10"'  (1.778 m), weight 263 lb 11.2 oz (119.6 kg), SpO2 94 %. Alert. No distress. HEENT: No scleral icterus. Pharynx moist. No thyromegaly or lymphadenopathy. Lungs clear. Heart regular with no murmurs. Abdomen soft and nontender with no hepatosplenomegaly. No CVA tenderness. Extremities revealed normal pulses with no edema. Neurologic exam grossly intact. Lymph nodes reveal no cervical or supraclavicular enlargement.  Labs on Admission:  Results for orders placed or performed during the hospital encounter of 10/17/15 (from the past 48 hour(s))  CBC      Status: None   Collection Time: 10/17/15  1:57 PM  Result Value Ref Range   WBC 8.0 4.0 - 10.5 K/uL   RBC 4.81 4.22 - 5.81 MIL/uL   Hemoglobin 14.8 13.0 - 17.0 g/dL   HCT 43.9 39.0 - 52.0 %   MCV 91.3 78.0 - 100.0 fL   MCH 30.8 26.0 - 34.0 pg   MCHC 33.7 30.0 - 36.0 g/dL   RDW 13.8 11.5 - 15.5 %   Platelets 318 150 - 400 K/uL  Comprehensive metabolic panel     Status: Abnormal   Collection Time: 10/17/15  1:57 PM  Result Value Ref Range   Sodium 139 135 - 145 mmol/L   Potassium 5.5 (H) 3.5 - 5.1 mmol/L   Chloride 107 101 - 111 mmol/L   CO2 23 22 - 32 mmol/L   Glucose, Bld 101 (H) 65 - 99 mg/dL   BUN 67 (H) 6 - 20 mg/dL   Creatinine, Ser 6.23 (H) 0.61 - 1.24 mg/dL   Calcium 9.3 8.9 - 10.3 mg/dL   Total Protein 8.6 (H) 6.5 - 8.1 g/dL   Albumin 4.4 3.5 - 5.0 g/dL   AST 23 15 - 41 U/L   ALT 30 17 - 63 U/L   Alkaline Phosphatase 39 38 - 126 U/L   Total Bilirubin 0.9 0.3 - 1.2 mg/dL   GFR calc non Af Amer 9 (L) >60 mL/min   GFR calc Af Amer 11 (L) >60 mL/min    Comment: (NOTE) The eGFR has been calculated using the CKD EPI equation. This calculation has not been validated in all clinical situations. eGFR's persistently <60 mL/min signify possible Chronic Kidney Disease.    Anion gap 9 5 - 15  Magnesium     Status: None   Collection Time: 10/17/15  1:57 PM  Result Value Ref Range   Magnesium 2.1 1.7 - 2.4 mg/dL  Phosphorus     Status: Abnormal   Collection Time: 10/17/15  1:57 PM  Result Value Ref Range   Phosphorus 5.9 (H) 2.5 - 4.6 mg/dL  Urinalysis, Routine w reflex microscopic (not at [facility])     Status: Abnormal   Collection Time: 10/17/15  7:00 PM  Result Value Ref Range   Color, Urine YELLOW YELLOW   APPearance CLEAR CLEAR   Specific Gravity, Urine 1.015 1.005 - 1.030   pH 6.0 5.0 - 8.0   Glucose, UA NEGATIVE NEGATIVE mg/dL   Hgb urine dipstick MODERATE (A) NEGATIVE   Bilirubin Urine NEGATIVE NEGATIVE   Ketones, ur NEGATIVE NEGATIVE mg/dL   Protein, ur 30 (A)  NEGATIVE mg/dL   Nitrite NEGATIVE NEGATIVE   Leukocytes, UA NEGATIVE NEGATIVE  Creatinine, urine, random     Status: None   Collection Time: 10/17/15  7:00 PM  Result Value Ref Range   Creatinine, Urine 112.30 mg/dL  Sodium, urine, random     Status: None   Collection  Time: 10/17/15  7:00 PM  Result Value Ref Range   Sodium, Ur 96 mmol/L  Urine microscopic-add on     Status: Abnormal   Collection Time: 10/17/15  7:00 PM  Result Value Ref Range   Squamous Epithelial / LPF 0-5 (A) NONE SEEN   WBC, UA 0-5 0 - 5 WBC/hpf   RBC / HPF 0-5 0 - 5 RBC/hpf   Bacteria, UA RARE (A) NONE SEEN  Basic metabolic panel     Status: Abnormal   Collection Time: 10/18/15  6:21 AM  Result Value Ref Range   Sodium 140 135 - 145 mmol/L   Potassium 5.3 (H) 3.5 - 5.1 mmol/L   Chloride 111 101 - 111 mmol/L   CO2 23 22 - 32 mmol/L   Glucose, Bld 99 65 - 99 mg/dL   BUN 53 (H) 6 - 20 mg/dL   Creatinine, Ser 4.34 (H) 0.61 - 1.24 mg/dL   Calcium 8.7 (L) 8.9 - 10.3 mg/dL   GFR calc non Af Amer 14 (L) >60 mL/min   GFR calc Af Amer 16 (L) >60 mL/min    Comment: (NOTE) The eGFR has been calculated using the CKD EPI equation. This calculation has not been validated in all clinical situations. eGFR's persistently <60 mL/min signify possible Chronic Kidney Disease.    Anion gap 6 5 - 15    Radiological Exams on Admission: US Renal  Result Date: 10/17/2015 CLINICAL DATA:  Acute renal failure EXAM: RENAL / URINARY TRACT ULTRASOUND COMPLETE COMPARISON:  [facility] RENAL/URINARY TRACT ULTRASOUND from 04/09/2009. FINDINGS: Right Kidney: Length: 9.2 cm. Interval development of fairly prominent diffuse cortical thinning. The exophytic 2 cm lesion seen previously is not evident today. Left Kidney: Length: 11.7 cm. Echogenicity within normal limits. No mass or hydronephrosis visualized. Bladder: Decompressed IMPRESSION: 1. No hydronephrosis. 2. Interval right renal atrophy. Given unilateral nature of this  finding, or renal artery stenosis would be a consideration. Electronically Signed   By: Misty Stanley M.D.   On: 10/17/2015 15:13    Assessment/Plan Active Problems:   ARF (acute renal failure) ([place]) #1. Acute renal failure. His been hospitalized to start IV fluids and have additional evaluation and treatment with nephrology. Obtain renal ultrasound. Hold losartan. #2. Nephrolithiasis. He has recently passed a stone on the left. #3. Hypertension. Hold losartan. #4. History of prostate carcinoma. He has had no sign of recurrence.   Thomas [name]    10/18/2015

## 2015-10-18 NOTE — Progress Notes (Signed)
Subjective: Interval History: has no complaint of  Of abdominal pain, hematuria, nausea or vomiting..  Objective: Vital signs in last 24 hours: Temp:  [98.1 F (36.7 C)-99.4 F (37.4 C)] 98.3 F (36.8 C) (08/17 0548) Pulse Rate:  [86-97] 86 (08/17 0548) Resp:  [18] 18 (08/17 0548) BP: (136-153)/(89-93) 136/93 (08/17 0548) SpO2:  [94 %-100 %] 94 % (08/17 0548) Weight:  [118.3 kg (260 lb 12.8 oz)-119.6 kg (263 lb 11.2 oz)] 119.6 kg (263 lb 11.2 oz) (08/17 0548) Weight change:   Intake/Output from previous day: 08/16 0701 - 08/17 0700 In: 480 [P.O.:480] Out: 1300 [Urine:1300] Intake/Output this shift: Total I/O In: 480 [P.O.:480] Out: -   General appearance: alert, cooperative and no distress Resp: clear to auscultation bilaterally Cardio: regular rate and rhythm, S1, S2 normal, no murmur, click, rub or gallop GI: soft, non-tender; bowel sounds normal; no masses,  no organomegaly Extremities: extremities normal, atraumatic, no cyanosis or edema  Lab Results:  Recent Labs  10/17/15 1357  WBC 8.0  HGB 14.8  HCT 43.9  PLT 318   BMET:  Recent Labs  10/17/15 1357 10/18/15 0621  NA 139 140  K 5.5* 5.3*  CL 107 111  CO2 23 23  GLUCOSE 101* 99  BUN 67* 53*  CREATININE 6.23* 4.34*  CALCIUM 9.3 8.7*   No results for input(Thomas [name]): PTH in the last 72 hours. Iron Studies: No results for input(Thomas [name]): IRON, TIBC, TRANSFERRIN, FERRITIN in the last 72 hours.  Studies/Results: US Renal  Result Date: 10/17/2015 CLINICAL DATA:  Acute renal failure EXAM: RENAL / URINARY TRACT ULTRASOUND COMPLETE COMPARISON:  [facility] RENAL/URINARY TRACT ULTRASOUND from 04/09/2009. FINDINGS: Right Kidney: Length: 9.2 cm. Interval development of fairly prominent diffuse cortical thinning. The exophytic 2 cm lesion seen previously is not evident today. Left Kidney: Length: 11.7 cm. Echogenicity within normal limits. No mass or hydronephrosis visualized. Bladder: Decompressed IMPRESSION:  1. No hydronephrosis. 2. Interval right renal atrophy. Given unilateral nature of this finding, or renal artery stenosis would be a consideration. Electronically Signed   By: Misty Stanley M.D.   On: 10/17/2015 15:13    I have reviewed the patient'Thomas [name] current medications.  Assessment/Plan:  Problem #1 acute kidney injury superimposed on chronic. Possibly prerenal versus ATN. His renal function continued to improve. Patient is asymptomatic. Problem #2 hyperkalemia: His potassium is improving  Problem #3 history of chronic renal failure: Stage III Problem #4 small right kidney: Since patient has recurrent kidney stone possibly the atrophy is secondary to obstructive uropathy I.e. Interstitial nephritis rather than renal artery stenosis. However can't rule out renal artery stenosis at this moment. Problem #5 history of kidney stone. Patient had one episode before his admission. Problem #6  Obstructive sleep apnea Problem #7 history of prostate cancer: Status post prostate surgery Problem #8 history of hypothyroidism secondary  To thyroidectomy Plan: 1]Agree with hydration          2] patient advised to increase his fluid intake          3] if patient is going to discharged for same in a week and check his blood work.          4] after his renal function stabilizes possibly we may send him for a renal Doppler to rule out renal artery stenosis.   LOS: 1 day   Thomas Thomas [name] 10/18/2015,9:51 AM

## 2015-10-18 NOTE — Progress Notes (Signed)
Subjective: He denies any recurrent flank pain. No nausea or vomiting. He states he is urinating well.  Objective: Vital signs in last 24 hours: Vitals:   10/17/15 1402 10/17/15 1930 10/17/15 2139 10/18/15 0548  BP: (!) 141/89  (!) 153/92 (!) 136/93  Pulse: 97  86 86  Resp:   18 18  Temp: 99.4 F (37.4 C)  98.1 F (36.7 C) 98.3 F (36.8 C)  TempSrc: Oral  Oral Oral  SpO2: 100% 94% 96% 94%  Weight: 260 lb 12.8 oz (118.3 kg)   263 lb 11.2 oz (119.6 kg)  Height: 5\' 10"  (1.778 m)   5\' 10"  (1.778 m)   Weight change:   Intake/Output Summary (Last 24 hours) at 10/18/15 0733 Last data filed at 10/18/15 0550  Gross per 24 hour  Intake              480 ml  Output             1300 ml  Net             -820 ml    Physical Exam: Alert. No distress. Lungs clear. Heart regular with no murmurs. Abdomen nontender with no hepatosplenomegaly. No CVA tenderness. Extremities reveal no edema.  Lab Results:    Results for orders placed or performed during the hospital encounter of 10/17/15 (from the past 24 hour(s))  CBC     Status: None   Collection Time: 10/17/15  1:57 PM  Result Value Ref Range   WBC 8.0 4.0 - 10.5 K/uL   RBC 4.81 4.22 - 5.81 MIL/uL   Hemoglobin 14.8 13.0 - 17.0 g/dL   HCT 43.9 39.0 - 52.0 %   MCV 91.3 78.0 - 100.0 fL   MCH 30.8 26.0 - 34.0 pg   MCHC 33.7 30.0 - 36.0 g/dL   RDW 13.8 11.5 - 15.5 %   Platelets 318 150 - 400 K/uL  Comprehensive metabolic panel     Status: Abnormal   Collection Time: 10/17/15  1:57 PM  Result Value Ref Range   Sodium 139 135 - 145 mmol/L   Potassium 5.5 (H) 3.5 - 5.1 mmol/L   Chloride 107 101 - 111 mmol/L   CO2 23 22 - 32 mmol/L   Glucose, Bld 101 (H) 65 - 99 mg/dL   BUN 67 (H) 6 - 20 mg/dL   Creatinine, Ser 6.23 (H) 0.61 - 1.24 mg/dL   Calcium 9.3 8.9 - 10.3 mg/dL   Total Protein 8.6 (H) 6.5 - 8.1 g/dL   Albumin 4.4 3.5 - 5.0 g/dL   AST 23 15 - 41 U/L   ALT 30 17 - 63 U/L   Alkaline Phosphatase 39 38 - 126 U/L   Total  Bilirubin 0.9 0.3 - 1.2 mg/dL   GFR calc non Af Amer 9 (L) >60 mL/min   GFR calc Af Amer 11 (L) >60 mL/min   Anion gap 9 5 - 15  Magnesium     Status: None   Collection Time: 10/17/15  1:57 PM  Result Value Ref Range   Magnesium 2.1 1.7 - 2.4 mg/dL  Phosphorus     Status: Abnormal   Collection Time: 10/17/15  1:57 PM  Result Value Ref Range   Phosphorus 5.9 (H) 2.5 - 4.6 mg/dL  Urinalysis, Routine w reflex microscopic (not at [facility])     Status: Abnormal   Collection Time: 10/17/15  7:00 PM  Result Value Ref Range   Color, Urine YELLOW YELLOW   APPearance  CLEAR CLEAR   Specific Gravity, Urine 1.015 1.005 - 1.030   pH 6.0 5.0 - 8.0   Glucose, UA NEGATIVE NEGATIVE mg/dL   Hgb urine dipstick MODERATE (A) NEGATIVE   Bilirubin Urine NEGATIVE NEGATIVE   Ketones, ur NEGATIVE NEGATIVE mg/dL   Protein, ur 30 (A) NEGATIVE mg/dL   Nitrite NEGATIVE NEGATIVE   Leukocytes, UA NEGATIVE NEGATIVE  Creatinine, urine, random     Status: None   Collection Time: 10/17/15  7:00 PM  Result Value Ref Range   Creatinine, Urine 112.30 mg/dL  Sodium, urine, random     Status: None   Collection Time: 10/17/15  7:00 PM  Result Value Ref Range   Sodium, Ur 96 mmol/L  Urine microscopic-add on     Status: Abnormal   Collection Time: 10/17/15  7:00 PM  Result Value Ref Range   Squamous Epithelial / LPF 0-5 (A) NONE SEEN   WBC, UA 0-5 0 - 5 WBC/hpf   RBC / HPF 0-5 0 - 5 RBC/hpf   Bacteria, UA RARE (A) NONE SEEN  Basic metabolic panel     Status: Abnormal   Collection Time: 10/18/15  6:21 AM  Result Value Ref Range   Sodium 140 135 - 145 mmol/L   Potassium 5.3 (H) 3.5 - 5.1 mmol/L   Chloride 111 101 - 111 mmol/L   CO2 23 22 - 32 mmol/L   Glucose, Bld 99 65 - 99 mg/dL   BUN 53 (H) 6 - 20 mg/dL   Creatinine, Ser 4.34 (H) 0.61 - 1.24 mg/dL   Calcium 8.7 (L) 8.9 - 10.3 mg/dL   GFR calc non Af Amer 14 (L) >60 mL/min   GFR calc Af Amer 16 (L) >60 mL/min   Anion gap 6 5 - 15     ABGS No results for  input(s): PHART, PO2ART, TCO2, HCO3 in the last 72 hours.  Invalid input(s): PCO2 CULTURES No results found for this or any previous visit (from the past 240 hour(s)). Studies/Results: US Renal  Result Date: 10/17/2015 CLINICAL DATA:  Acute renal failure EXAM: RENAL / URINARY TRACT ULTRASOUND COMPLETE COMPARISON:  [facility] RENAL/URINARY TRACT ULTRASOUND from 04/09/2009. FINDINGS: Right Kidney: Length: 9.2 cm. Interval development of fairly prominent diffuse cortical thinning. The exophytic 2 cm lesion seen previously is not evident today. Left Kidney: Length: 11.7 cm. Echogenicity within normal limits. No mass or hydronephrosis visualized. Bladder: Decompressed IMPRESSION: 1. No hydronephrosis. 2. Interval right renal atrophy. Given unilateral nature of this finding, or renal artery stenosis would be a consideration. Electronically Signed   By: Misty Stanley M.D.   On: 10/17/2015 15:13   Micro Results: No results found for this or any previous visit (from the past 240 hour(s)). Studies/Results: US Renal  Result Date: 10/17/2015 CLINICAL DATA:  Acute renal failure EXAM: RENAL / URINARY TRACT ULTRASOUND COMPLETE COMPARISON:  [facility] RENAL/URINARY TRACT ULTRASOUND from 04/09/2009. FINDINGS: Right Kidney: Length: 9.2 cm. Interval development of fairly prominent diffuse cortical thinning. The exophytic 2 cm lesion seen previously is not evident today. Left Kidney: Length: 11.7 cm. Echogenicity within normal limits. No mass or hydronephrosis visualized. Bladder: Decompressed IMPRESSION: 1. No hydronephrosis. 2. Interval right renal atrophy. Given unilateral nature of this finding, or renal artery stenosis would be a consideration. Electronically Signed   By: Misty Stanley M.D.   On: 10/17/2015 15:13   Medications:  I have reviewed the patient's current medications Scheduled Meds: . enoxaparin (LOVENOX) injection  30 mg Subcutaneous Q24H  Continuous Infusions: .  sodium chloride 175 mL/hr at 10/18/15 0233   PRN Meds:.ondansetron **OR** ondansetron (ZOFRAN) IV   Assessment/Plan: #1. Acute renal failure. Creatinine is steadily improving and is down to 4.3 today. Renal ultrasound reveals no hydronephrosis. Right kidney is atrophied. Appreciate nephrology consult. Continue IV fluids. #2. Nephrolithiasis. Recent passed stone. #3. Prostate cancer. He has had no sign of recurrence. #4. Hypertension. Continue holding losartan. Active Problems:   ARF (acute renal failure) ([place])     LOS: 1 day   Lenix Benoist 10/18/2015, 7:33 AM

## 2015-10-18 NOTE — Progress Notes (Signed)
Patient's IV removed.  Site WNL.  AVS reviewed with patient.  Patient verbalized understanding of discharge instructions, physician follow-up, medications.  Patient transported by NT via w/c to main entrance for discharge.  Patient stable at time of discharge. 

## 2015-10-19 LAB — IMMUNOFIXATION ELECTROPHORESIS
IgA: 245 mg/dL (ref 90–386)
IgG (Immunoglobin G), Serum: 1165 mg/dL (ref 700–1600)
IgM, Serum: 115 mg/dL (ref 20–172)
Total Protein ELP: 7.6 g/dL (ref 6.0–8.5)

## 2015-10-30 NOTE — Discharge Summary (Signed)
Physician Discharge Summary  RAYGAN CHINCHILLA [id number] DOB: Sep 05, 1960 DOA: 10/17/2015   Admit date: 10/17/2015 Discharge date: 10/30/2015  Discharge Diagnoses:  Active Problems:   ARF (acute renal failure) ([facility])    Wt Readings from Last 3 Encounters:  10/18/15 263 lb 11.2 oz (119.6 kg)  07/13/13 258 lb (117 kg)  07/12/13 265 lb (120.2 kg)     Hospital Course:  This patient is a 55 year old male with a history of chronic kidney disease who presented with acute renal failure following an episode of renal colic and a superimposed diarrheal illness. Creatinine increased from the 2 range to over 8 when checked at the office. He was subsequent hospitalized. Ultrasound revealed unilateral atrophy but no hydronephrosis. No residual stone was present. He was treated with IV fluids. Losartan was held. He was seen in consultation by nephrology. His creatinine improved to 4. He had good urine output. He underwent extensive additional studies revealing no other underlying etiology. He has a history of prostate carcinoma but has had no sign of recurrence. His original renal insufficiency was felt to be related to the use of naproxen for his disc disease. He is fully aware that he needs to avoid any additional anti-inflammatory drugs.  Condition was much improved after intravenous hydration. He will continue oral hydration and will have follow-up within one week with nephrology. He will continue to hold losartan.   Discharge Instructions     Medication List    STOP taking these medications   HYDROcodone-acetaminophen 5-325 MG tablet Commonly known as:  NORCO   losartan 100 MG tablet Commonly known as:  COZAAR        Montavius Subramaniam 10/30/2015

## 2016-06-20 DIAGNOSIS — I10 Essential (primary) hypertension: Secondary | ICD-10-CM | POA: Diagnosis not present

## 2016-06-20 DIAGNOSIS — Z6841 Body Mass Index (BMI) 40.0 and over, adult: Secondary | ICD-10-CM | POA: Diagnosis not present

## 2016-06-20 DIAGNOSIS — Z8546 Personal history of malignant neoplasm of prostate: Secondary | ICD-10-CM | POA: Diagnosis not present

## 2016-06-21 DIAGNOSIS — I10 Essential (primary) hypertension: Secondary | ICD-10-CM | POA: Diagnosis not present

## 2016-06-21 DIAGNOSIS — C61 Malignant neoplasm of prostate: Secondary | ICD-10-CM | POA: Diagnosis not present

## 2016-06-21 DIAGNOSIS — Z79899 Other long term (current) drug therapy: Secondary | ICD-10-CM | POA: Diagnosis not present

## 2016-12-23 DIAGNOSIS — Z6841 Body Mass Index (BMI) 40.0 and over, adult: Secondary | ICD-10-CM | POA: Diagnosis not present

## 2016-12-23 DIAGNOSIS — I10 Essential (primary) hypertension: Secondary | ICD-10-CM | POA: Diagnosis not present

## 2016-12-23 DIAGNOSIS — M5416 Radiculopathy, lumbar region: Secondary | ICD-10-CM | POA: Diagnosis not present

## 2016-12-23 DIAGNOSIS — Z8546 Personal history of malignant neoplasm of prostate: Secondary | ICD-10-CM | POA: Diagnosis not present

## 2017-05-05 ENCOUNTER — Other Ambulatory Visit (HOSPITAL_COMMUNITY): Payer: Self-pay | Admitting: Internal Medicine

## 2017-05-05 ENCOUNTER — Ambulatory Visit (HOSPITAL_COMMUNITY)
Admission: RE | Admit: 2017-05-05 | Discharge: 2017-05-05 | Disposition: A | Discharge status: Home / Self Care | Payer: BLUE CROSS/BLUE SHIELD | Source: Ambulatory Visit | Attending: Internal Medicine | Admitting: Internal Medicine

## 2017-05-05 DIAGNOSIS — R2242 Localized swelling, mass and lump, left lower limb: Secondary | ICD-10-CM | POA: Diagnosis not present

## 2017-05-05 DIAGNOSIS — M7989 Other specified soft tissue disorders: Secondary | ICD-10-CM

## 2017-05-05 DIAGNOSIS — R600 Localized edema: Secondary | ICD-10-CM | POA: Diagnosis not present

## 2017-06-09 DIAGNOSIS — I10 Essential (primary) hypertension: Secondary | ICD-10-CM | POA: Diagnosis not present

## 2017-06-09 DIAGNOSIS — Z79899 Other long term (current) drug therapy: Secondary | ICD-10-CM | POA: Diagnosis not present

## 2017-06-09 DIAGNOSIS — D075 Carcinoma in situ of prostate: Secondary | ICD-10-CM | POA: Diagnosis not present

## 2017-06-16 DIAGNOSIS — I10 Essential (primary) hypertension: Secondary | ICD-10-CM | POA: Diagnosis not present

## 2017-06-16 DIAGNOSIS — Z8546 Personal history of malignant neoplasm of prostate: Secondary | ICD-10-CM | POA: Diagnosis not present

## 2017-07-04 DIAGNOSIS — M545 Low back pain: Secondary | ICD-10-CM | POA: Diagnosis not present

## 2017-07-09 ENCOUNTER — Other Ambulatory Visit: Payer: Self-pay | Admitting: Orthopedic Surgery

## 2017-07-09 DIAGNOSIS — M545 Low back pain: Secondary | ICD-10-CM

## 2017-07-15 ENCOUNTER — Ambulatory Visit
Admission: RE | Admit: 2017-07-15 | Discharge: 2017-07-15 | Disposition: A | Discharge status: Home / Self Care | Payer: BLUE CROSS/BLUE SHIELD | Source: Ambulatory Visit | Attending: Orthopedic Surgery | Admitting: Orthopedic Surgery

## 2017-07-15 DIAGNOSIS — M545 Low back pain: Secondary | ICD-10-CM

## 2017-07-22 ENCOUNTER — Ambulatory Visit
Admission: RE | Admit: 2017-07-22 | Discharge: 2017-07-22 | Disposition: A | Discharge status: Home / Self Care | Payer: BLUE CROSS/BLUE SHIELD | Source: Ambulatory Visit | Attending: Orthopedic Surgery | Admitting: Orthopedic Surgery

## 2017-07-22 DIAGNOSIS — M545 Low back pain: Secondary | ICD-10-CM | POA: Diagnosis not present

## 2017-07-31 DIAGNOSIS — M5416 Radiculopathy, lumbar region: Secondary | ICD-10-CM | POA: Diagnosis not present

## 2017-08-03 DIAGNOSIS — M5416 Radiculopathy, lumbar region: Secondary | ICD-10-CM | POA: Diagnosis not present

## 2017-09-08 DIAGNOSIS — M5416 Radiculopathy, lumbar region: Secondary | ICD-10-CM | POA: Diagnosis not present

## 2017-12-21 DIAGNOSIS — I10 Essential (primary) hypertension: Secondary | ICD-10-CM | POA: Diagnosis not present

## 2017-12-21 DIAGNOSIS — M5136 Other intervertebral disc degeneration, lumbar region: Secondary | ICD-10-CM | POA: Diagnosis not present

## 2018-02-01 DIAGNOSIS — I10 Essential (primary) hypertension: Secondary | ICD-10-CM | POA: Diagnosis not present

## 2018-05-28 IMAGING — MR MR LUMBAR SPINE W/O CM
4 of 5 series · 25 of 48 positions shown · non-contrast
Comparison: None.

CLINICAL DATA: Low back pain. LEFT hip and leg pain. Symptoms for 8
months.

EXAM:
MRI LUMBAR SPINE WITHOUT CONTRAST
TECHNIQUE: Multiplanar, multisequence MR imaging of the lumbar spine was
performed. No intravenous contrast was administered.

[Series 4: T1 · sagittal · 4.0mm · 0.55mm/px · 5 of 13 slices shown (1 of 2)]
[im 1/13]
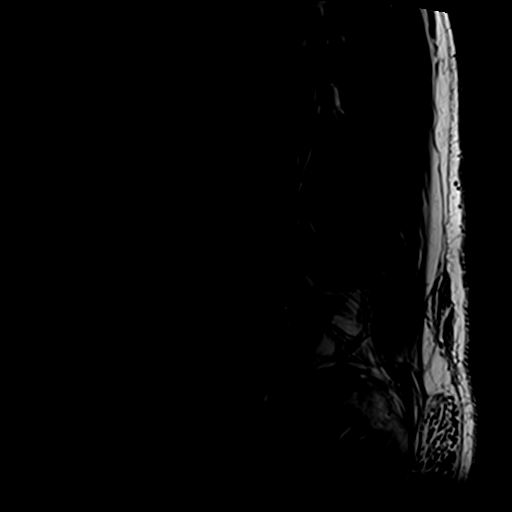
[im 4/13]
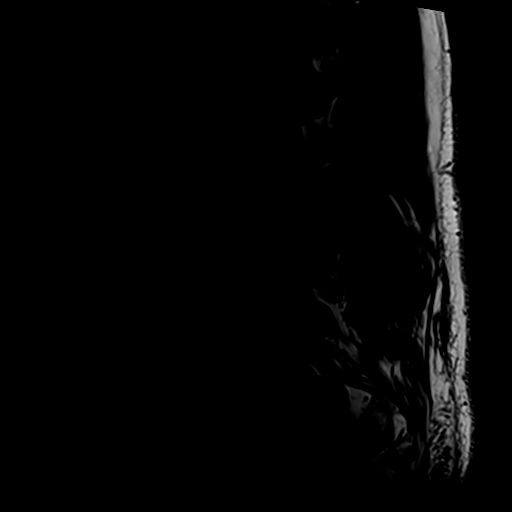
[im 7/13]
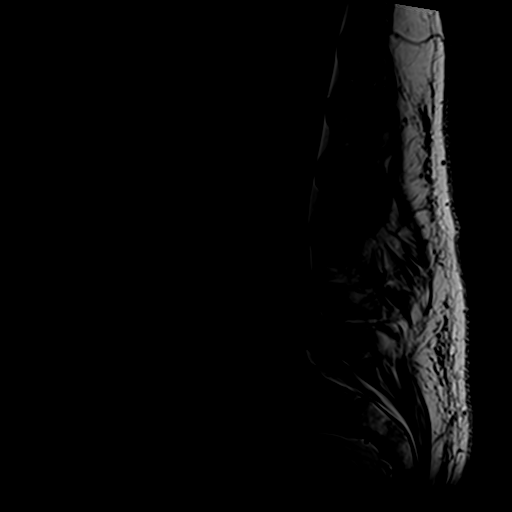
[im 10/13]
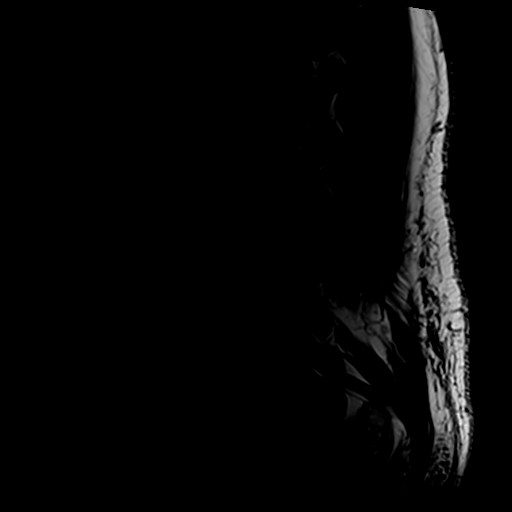
[im 13/13]
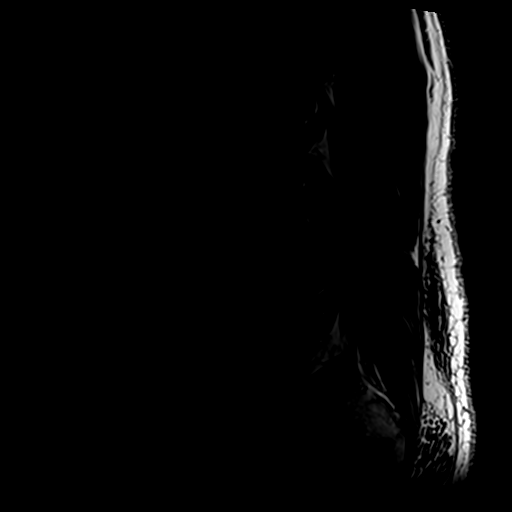

[Series 5: T2 · sagittal · 4.0mm · 0.55mm/px · 5 of 13 slices shown (1 of 2)]
[im 1/13]
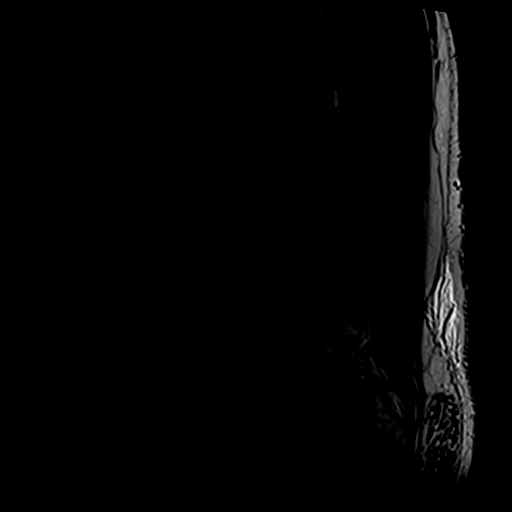
[im 4/13]
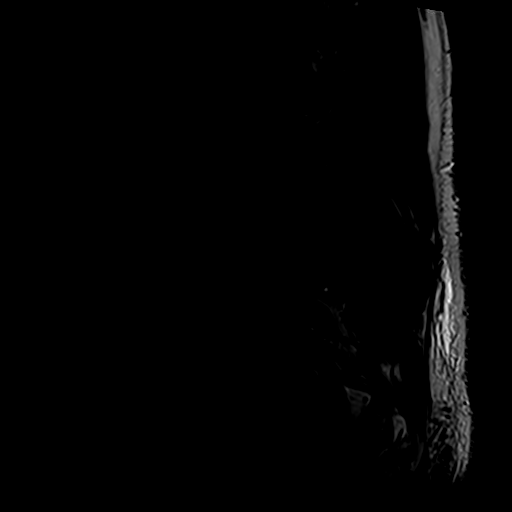
[im 7/13]
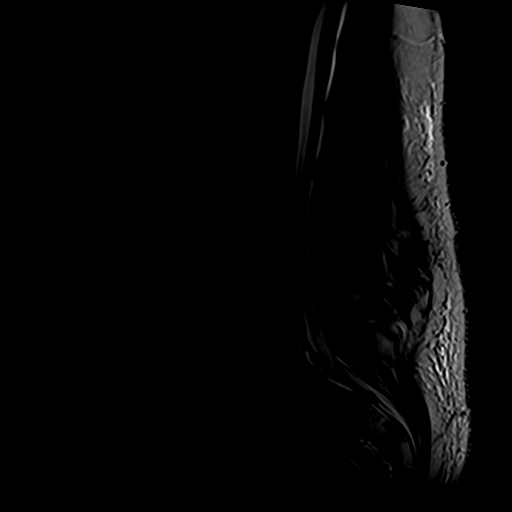
[im 10/13]
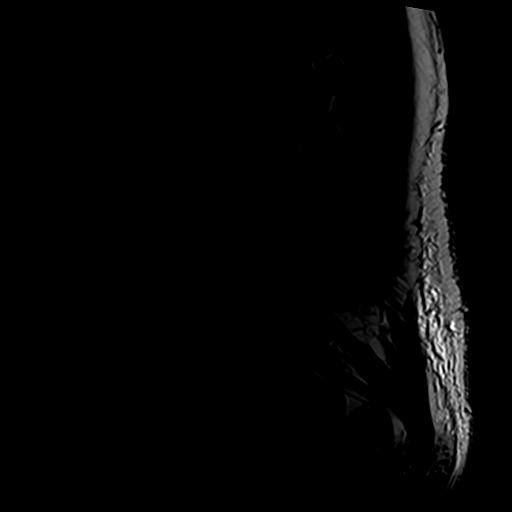
[im 13/13]
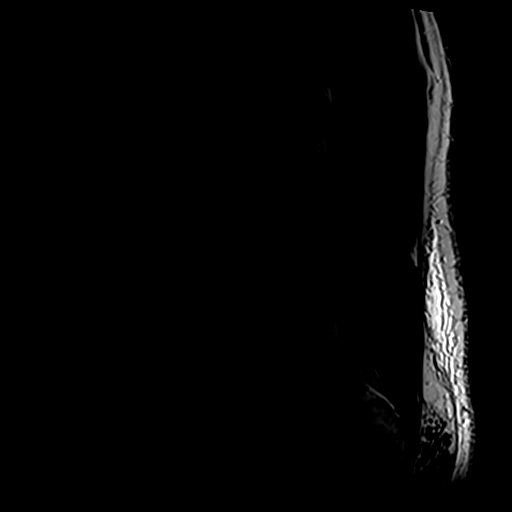

[Series 6: T2 · axial · 4.0mm · 0.78mm/px · z∈[-100,+115]mm · 10 of 39 slices shown (2 of 2)]
[im 3/39]
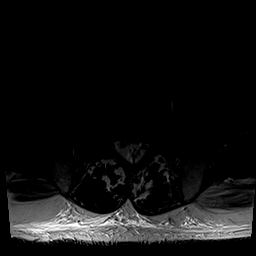
[im 6/39]
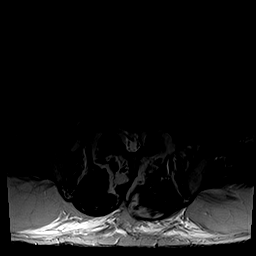
[im 8/39]
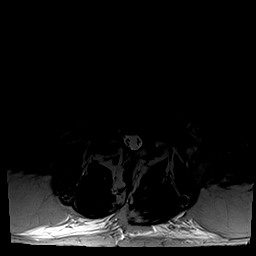
[im 13/39]
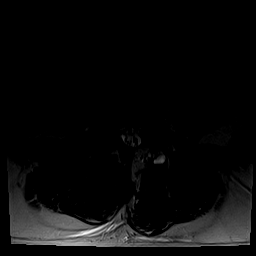
[im 18/39]
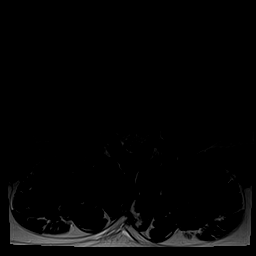
[im 21/39]
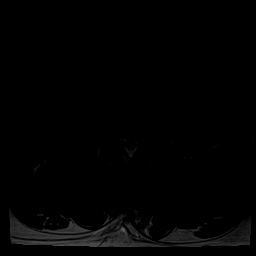
[im 23/39]
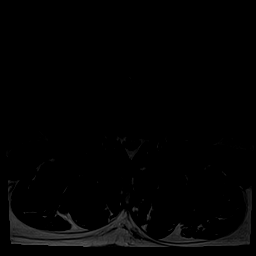
[im 28/39]
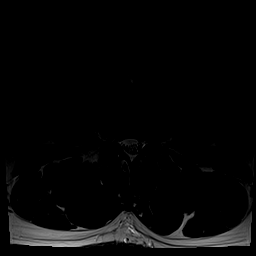
[im 33/39]
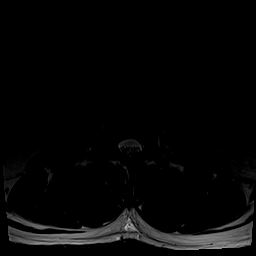
[im 39/39]
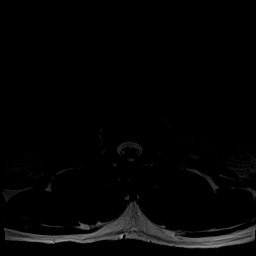

[Series 7: T1 · axial · 4.0mm · 0.39mm/px · z∈[-100,+84]mm · 5 of 39 slices shown (2 of 2)]
[im 3/39]
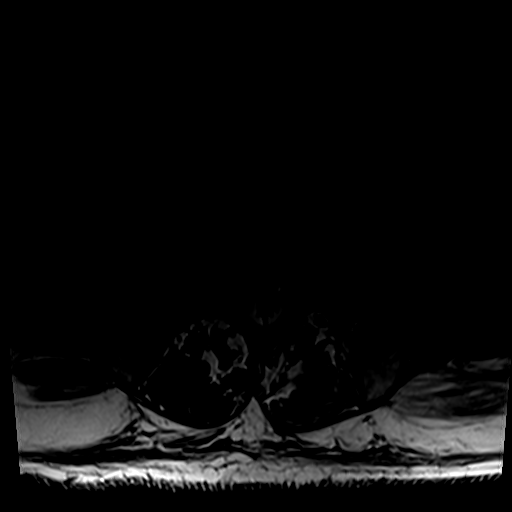
[im 6/39]
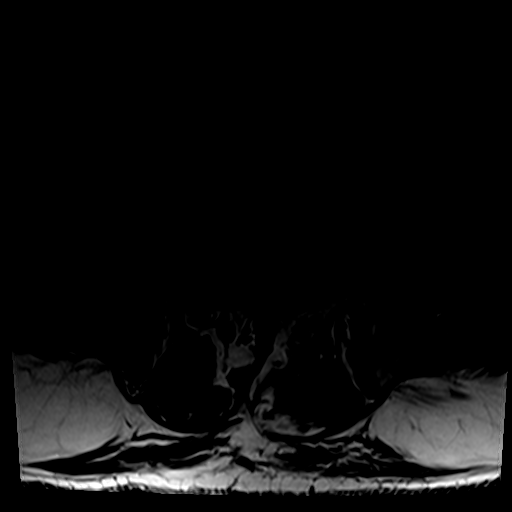
[im 8/39]
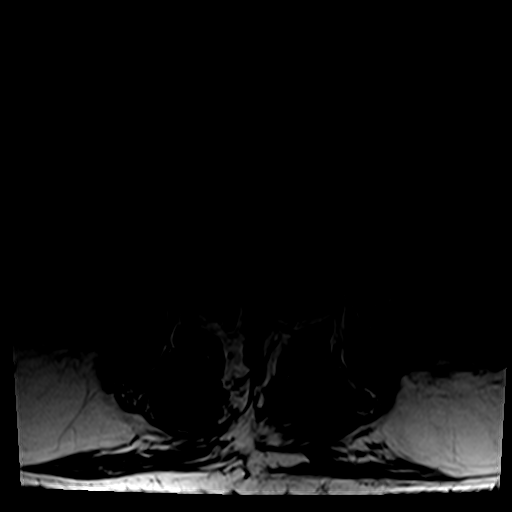
[im 21/39]
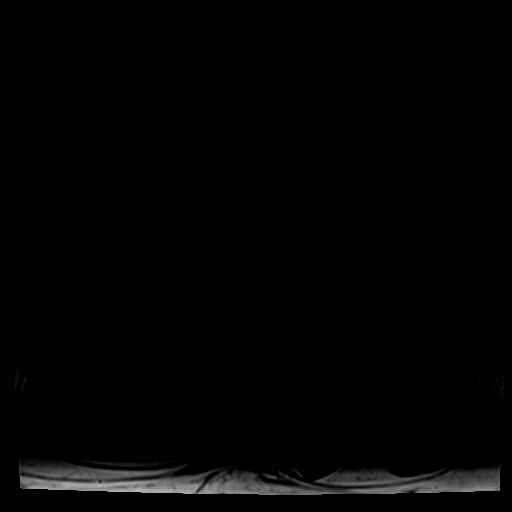
[im 33/39]
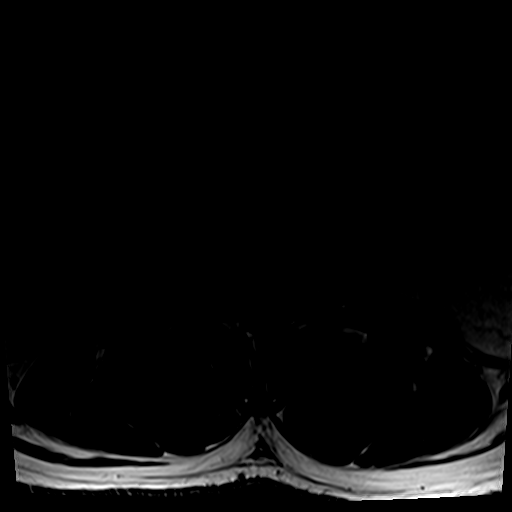

[25 of 48 positions shown; findings below may reference images not displayed]

FINDINGS: Segmentation:  Transitional appearing L5.

Alignment:  Anatomic.

Vertebrae:  No worrisome osseous lesion.

Conus medullaris and cauda equina: Conus extends to the L1. level.
Conus and cauda equina appear normal.

Paraspinal and other soft tissues: RIGHT renal atrophy is suspected.
This is incompletely evaluated.

Disc levels:

L1-L2:  Normal disc space.

L2-L3: Disc desiccation. Far-lateral and foraminal protrusion on the
LEFT. LEFT L2 neural impingement likely.

L3-L4: Disc desiccation slight loss of disc height. Central
protrusion. Mild facet arthropathy. Mild stenosis. LEFT L4 neural
impingement is likely.

L4-L5: Significant loss of interspace height. LEFT laminotomy.
Apparent partial facetectomy. Central protrusion extends both to the
RIGHT and LEFT. BILATERAL L4 and L5 neural impingement. The dominant
finding is severe foraminal narrowing on the LEFT. See sagittal
image 11 series 3.

L5-S1: Unremarkable disc space may be hypoplastic. Mild facet
arthropathy. No impingement.
IMPRESSION: Postsurgical changes L4-5, LEFT. Central disc material and osseous
spurring extending to both subarticular zones and foramina. LEFT
greater than RIGHT L4 and L5 neural impingement, particularly the
LEFT L4 nerve root in the foramen.

Apparent partial facetectomy at L4-5 on the LEFT, incompletely
evaluated. If concern for dynamic instability, recommend standing
lateral lumbar flexion extension views, versus CT myelography for
further evaluation.

Disc desiccation L3-4 with central protrusion and facet arthropathy.
LEFT L4 neural impingement is likely.

RIGHT renal atrophy, consider renal sonography.

## 2018-05-31 DIAGNOSIS — I10 Essential (primary) hypertension: Secondary | ICD-10-CM | POA: Diagnosis not present

## 2018-05-31 DIAGNOSIS — Z125 Encounter for screening for malignant neoplasm of prostate: Secondary | ICD-10-CM | POA: Diagnosis not present

## 2018-05-31 DIAGNOSIS — Z79899 Other long term (current) drug therapy: Secondary | ICD-10-CM | POA: Diagnosis not present

## 2018-06-07 DIAGNOSIS — N183 Chronic kidney disease, stage 3 (moderate): Secondary | ICD-10-CM | POA: Diagnosis not present

## 2018-06-07 DIAGNOSIS — I10 Essential (primary) hypertension: Secondary | ICD-10-CM | POA: Diagnosis not present

## 2018-07-06 ENCOUNTER — Other Ambulatory Visit (HOSPITAL_COMMUNITY): Payer: Self-pay | Admitting: Internal Medicine

## 2018-07-06 ENCOUNTER — Ambulatory Visit (HOSPITAL_COMMUNITY)
Admission: RE | Admit: 2018-07-06 | Discharge: 2018-07-06 | Disposition: A | Discharge status: Home / Self Care | Payer: BLUE CROSS/BLUE SHIELD | Source: Ambulatory Visit | Attending: Internal Medicine | Admitting: Internal Medicine

## 2018-07-06 ENCOUNTER — Other Ambulatory Visit: Payer: Self-pay

## 2018-07-06 DIAGNOSIS — J180 Bronchopneumonia, unspecified organism: Secondary | ICD-10-CM | POA: Insufficient documentation

## 2018-07-06 DIAGNOSIS — R05 Cough: Secondary | ICD-10-CM | POA: Diagnosis not present

## 2018-07-07 ENCOUNTER — Ambulatory Visit (HOSPITAL_COMMUNITY)
Admission: RE | Admit: 2018-07-07 | Discharge: 2018-07-07 | Disposition: A | Discharge status: Home / Self Care | Payer: BLUE CROSS/BLUE SHIELD | Source: Ambulatory Visit | Attending: Internal Medicine | Admitting: Internal Medicine

## 2018-07-07 ENCOUNTER — Other Ambulatory Visit (HOSPITAL_COMMUNITY): Payer: Self-pay | Admitting: Internal Medicine

## 2018-07-07 DIAGNOSIS — R0602 Shortness of breath: Secondary | ICD-10-CM

## 2018-07-07 DIAGNOSIS — R918 Other nonspecific abnormal finding of lung field: Secondary | ICD-10-CM | POA: Diagnosis not present

## 2018-07-30 DIAGNOSIS — I10 Essential (primary) hypertension: Secondary | ICD-10-CM | POA: Diagnosis not present

## 2018-07-30 DIAGNOSIS — R0602 Shortness of breath: Secondary | ICD-10-CM | POA: Diagnosis not present

## 2018-08-02 ENCOUNTER — Ambulatory Visit (INDEPENDENT_AMBULATORY_CARE_PROVIDER_SITE_OTHER): Payer: BLUE CROSS/BLUE SHIELD | Admitting: Otolaryngology

## 2018-08-02 ENCOUNTER — Other Ambulatory Visit: Payer: Self-pay

## 2018-08-02 DIAGNOSIS — G4733 Obstructive sleep apnea (adult) (pediatric): Secondary | ICD-10-CM

## 2018-08-02 DIAGNOSIS — R070 Pain in throat: Secondary | ICD-10-CM

## 2018-08-05 ENCOUNTER — Other Ambulatory Visit (HOSPITAL_COMMUNITY): Payer: Self-pay | Admitting: Internal Medicine

## 2018-08-05 DIAGNOSIS — R0602 Shortness of breath: Secondary | ICD-10-CM

## 2018-08-13 ENCOUNTER — Other Ambulatory Visit: Payer: Self-pay

## 2018-08-13 ENCOUNTER — Ambulatory Visit (HOSPITAL_COMMUNITY)
Admission: RE | Admit: 2018-08-13 | Discharge: 2018-08-13 | Disposition: A | Discharge status: Home / Self Care | Payer: BLUE CROSS/BLUE SHIELD | Source: Ambulatory Visit | Attending: Internal Medicine | Admitting: Internal Medicine

## 2018-08-13 DIAGNOSIS — R0602 Shortness of breath: Secondary | ICD-10-CM | POA: Diagnosis not present

## 2018-08-13 NOTE — Progress Notes (Signed)
*  PRELIMINARY RESULTS* Echocardiogram 2D Echocardiogram has been performed.  Thomas [name] 08/13/2018, 3:55 PM

## 2018-08-26 ENCOUNTER — Ambulatory Visit (INDEPENDENT_AMBULATORY_CARE_PROVIDER_SITE_OTHER): Payer: BC Managed Care – PPO | Admitting: Emergency Medicine

## 2018-08-26 ENCOUNTER — Other Ambulatory Visit: Payer: Self-pay

## 2018-08-26 ENCOUNTER — Encounter: Payer: Self-pay | Admitting: Emergency Medicine

## 2018-08-26 VITALS — BP 158/100 | HR 92 | Ht 70.0 in | Wt 291.0 lb

## 2018-08-26 DIAGNOSIS — J449 Chronic obstructive pulmonary disease, unspecified: Secondary | ICD-10-CM | POA: Diagnosis not present

## 2018-08-26 DIAGNOSIS — J301 Allergic rhinitis due to pollen: Secondary | ICD-10-CM

## 2018-08-26 DIAGNOSIS — G4733 Obstructive sleep apnea (adult) (pediatric): Secondary | ICD-10-CM | POA: Diagnosis not present

## 2018-08-26 DIAGNOSIS — J309 Allergic rhinitis, unspecified: Secondary | ICD-10-CM | POA: Insufficient documentation

## 2018-08-26 MED ORDER — STIOLTO RESPIMAT 2.5-2.5 MCG/ACT IN AERS: 2.0000 | INHALATION_SPRAY | Freq: Every day | RESPIRATORY_TRACT | 0 refills | Status: DC

## 2018-08-26 NOTE — Progress Notes (Signed)
Subjective:    Patient ID: Thomas [name], male    DOB: [birth year], 58 y.o.   MRN: [id number]  HPI 58 year old gentleman with a history of tobacco use (about 40 pack years), hypertension, obstructive sleep apnea not on CPAP (tried to use), prostate cancer.  He carries a diagnosis of COPD with PFT 09/26/2009 that I reviewed that show mild obstruction (FEV1 93% predicted) without a bronchodilator response. Wasn't on BD's  He has developed exertional SOB, difficulty tolerating hot air, increased mucous in his chest especially when supine. This has been happening over the last 3 months. Has has some correlated rhinitis and sinus drainage. He has developed LE edema over the same time period, has had some changes in BP meds over this time. He hears some wheeze, has some cough with clear mucous.   TTE 08/13/2018, normal LV fxn, some LVH, normal RV fxn CXR 5/6 >> normal.   He is on BP med, ? Which one.    Review of Systems  Constitutional: Negative for fever and unexpected weight change.  HENT: Negative for congestion, dental problem, ear pain, nosebleeds, postnasal drip, rhinorrhea, sinus pressure, sneezing, sore throat and trouble swallowing.   Eyes: Negative for redness and itching.  Respiratory: Positive for cough, chest tightness and shortness of breath. Negative for wheezing.   Cardiovascular: Positive for chest pain. Negative for palpitations and leg swelling.  Gastrointestinal: Negative for nausea and vomiting.  Genitourinary: Negative for dysuria.  Musculoskeletal: Negative for joint swelling.  Skin: Negative for rash.  Neurological: Negative for headaches.  Hematological: Does not bruise/bleed easily.  Psychiatric/Behavioral: Negative for dysphoric mood. The patient is not nervous/anxious.    Past Medical History:  Diagnosis Date  . Arthritis    "touch in my neck" (2012-09-13)  . COPD (chronic obstructive pulmonary disease) ([place])   . GERD (gastroesophageal reflux disease)   .  Hypertension   . Kidney stones    "passed on their own" (2012/09/13)  . Lateral meniscus tear   . Medial meniscus tear   . OSA (obstructive sleep apnea)    "cannot sleep w/mask" (09/13/12)  . Prostate cancer ([place])   . Shortness of breath    "can happen at anytime" (09/13/2012)  . Thyroid nodule    bilaterally/notes Sep 13, 2012     Family History  Problem Relation Age of Onset  . Aneurysm Mother   . Heart attack Father      Social History   Socioeconomic History  . Marital status: Married    Spouse name: Not on file  . Number of children: Not on file  . Years of education: Not on file  . Highest education level: Not on file  Occupational History  . Occupation: Unemployed, 2 years    Employer: UNEMPLOYED    Comment: former general tobacco, [employer]  . Financial resource strain: Not on file  . Food insecurity    Worry: Not on file    Inability: Not on file  . Transportation needs    Medical: Not on file    Non-medical: Not on file  Tobacco Use  . Smoking status: Former Smoker    Packs/day: 2.50    Years: 15.00    Pack years: 37.50    Types: Cigarettes    Quit date: 03/03/1993    Years since quitting: 25.4  . Smokeless tobacco: Never Used  Substance and Sexual Activity  . Alcohol use: Yes    Alcohol/week: 14.0 standard drinks    Types: 14 Cans of  beer per week    Comment: 08/25/2012 "2 beers/day average"  . Drug use: No  . Sexual activity: Yes  Lifestyle  . Physical activity    Days per week: Not on file    Minutes per session: Not on file  . Stress: Not on file  Relationships  . Social Herbalist on phone: Not on file    Gets together: Not on file    Attends religious service: Not on file    Active member of club or organization: Not on file    Attends meetings of clubs or organizations: Not on file    Relationship status: Not on file  . Intimate partner violence    Fear of current or ex partner: Not on file    Emotionally abused:  Not on file    Physically abused: Not on file    Forced sexual activity: Not on file  Other Topics Concern  . Not on file  Social History Narrative   Lives with wife     No Known Allergies   No outpatient medications prior to visit.   No facility-administered medications prior to visit.         Objective:   Physical Exam  Vitals:   08/26/18 1407  BP: (!) 158/100  Pulse: 92  SpO2: 94%  Weight: 291 lb (132 kg)  Height: 5\' 10"  (1.778 m)   Gen: Pleasant, obese man, in no distress,  normal affect  ENT: No lesions,  mouth clear,  oropharynx clear, no postnasal drip  Neck: large neck, No JVD, stridor soft on insp and exp  Lungs: No use of accessory muscles, no crackles or wheezing on normal respiration, no wheeze on forced expiration, referred UA noise  Cardiovascular: RRR, heart sounds normal, no murmur or gallops, 1-2+ LE peripheral edema  Musculoskeletal: No deformities, no cyanosis or clubbing  Neuro: alert, awake, non focal  Skin: Warm, no lesions or rash      Assessment & Plan:  Sleep apnea He tried CPAP but was unable to tolerate.  Is not on therapy right now.  We will have to reconsider going forward.  Based on his neck size I suspect he would not be a good candidate for the dental device  COPD (chronic obstructive pulmonary disease) Only mild obstruction noted on his PFT from 2011 but his symptoms now do sound obstructive in nature.  He is not on a bronchodilator and like to do a therapeutic trial.  I will also repeat his PFTs.  Some of his symptoms did correlate with increased allergies and mucus production this spring.  He has some stridor today and it is possible that in part this is upper airway obstruction.  Allergic rhinitis Start loratadine and follow upper airway symptoms  Baltazar Apo, MD, PhD 08/26/2018, 3:00 PM [facility] [phone] or if no answer [phone]

## 2018-08-26 NOTE — Assessment & Plan Note (Signed)
Start loratadine and follow upper airway symptoms

## 2018-08-26 NOTE — Progress Notes (Signed)
Patient seen in the office today and instructed on use of Stiolto Respimat.  Patient expressed understanding and demonstrated technique.  

## 2018-08-26 NOTE — Patient Instructions (Signed)
We will plan to repeat your pulmonary function testing to compare with 2011. Please try starting Stiolto 2 puffs once daily to see if this benefits her breathing. Please start loratadine 10 mg (Claritin) once daily. We will revisit possible treatment for your sleep apnea going forward Follow with [name] in 1 month or next available with PFT

## 2018-08-26 NOTE — Assessment & Plan Note (Signed)
Only mild obstruction noted on his PFT from 2011 but his symptoms now do sound obstructive in nature.  He is not on a bronchodilator and like to do a therapeutic trial.  I will also repeat his PFTs.  Some of his symptoms did correlate with increased allergies and mucus production this spring.  He has some stridor today and it is possible that in part this is upper airway obstruction.

## 2018-08-26 NOTE — Assessment & Plan Note (Signed)
He tried CPAP but was unable to tolerate.  Is not on therapy right now.  We will have to reconsider going forward.  Based on his neck size I suspect he would not be a good candidate for the dental device

## 2018-09-16 ENCOUNTER — Other Ambulatory Visit: Payer: Self-pay

## 2018-09-16 ENCOUNTER — Emergency Department (HOSPITAL_COMMUNITY): Payer: BC Managed Care – PPO

## 2018-09-16 ENCOUNTER — Inpatient Hospital Stay (HOSPITAL_COMMUNITY)
Admission: EM | Admit: 2018-09-16 | Discharge: 2018-09-21 | DRG: 190 | Disposition: A | Discharge status: Home / Self Care | Payer: BC Managed Care – PPO | Attending: Internal Medicine | Admitting: Internal Medicine

## 2018-09-16 ENCOUNTER — Encounter (HOSPITAL_COMMUNITY): Payer: Self-pay | Admitting: Student

## 2018-09-16 DIAGNOSIS — I129 Hypertensive chronic kidney disease with stage 1 through stage 4 chronic kidney disease, or unspecified chronic kidney disease: Secondary | ICD-10-CM | POA: Diagnosis present

## 2018-09-16 DIAGNOSIS — I10 Essential (primary) hypertension: Secondary | ICD-10-CM | POA: Diagnosis not present

## 2018-09-16 DIAGNOSIS — R739 Hyperglycemia, unspecified: Secondary | ICD-10-CM | POA: Diagnosis not present

## 2018-09-16 DIAGNOSIS — Z209 Contact with and (suspected) exposure to unspecified communicable disease: Secondary | ICD-10-CM | POA: Diagnosis not present

## 2018-09-16 DIAGNOSIS — R0989 Other specified symptoms and signs involving the circulatory and respiratory systems: Secondary | ICD-10-CM | POA: Diagnosis not present

## 2018-09-16 DIAGNOSIS — Z79899 Other long term (current) drug therapy: Secondary | ICD-10-CM | POA: Diagnosis not present

## 2018-09-16 DIAGNOSIS — R079 Chest pain, unspecified: Secondary | ICD-10-CM | POA: Diagnosis not present

## 2018-09-16 DIAGNOSIS — J9601 Acute respiratory failure with hypoxia: Secondary | ICD-10-CM

## 2018-09-16 DIAGNOSIS — Z87442 Personal history of urinary calculi: Secondary | ICD-10-CM | POA: Diagnosis not present

## 2018-09-16 DIAGNOSIS — Z8546 Personal history of malignant neoplasm of prostate: Secondary | ICD-10-CM

## 2018-09-16 DIAGNOSIS — G4733 Obstructive sleep apnea (adult) (pediatric): Secondary | ICD-10-CM | POA: Diagnosis not present

## 2018-09-16 DIAGNOSIS — R600 Localized edema: Secondary | ICD-10-CM | POA: Diagnosis not present

## 2018-09-16 DIAGNOSIS — N183 Chronic kidney disease, stage 3 unspecified: Secondary | ICD-10-CM

## 2018-09-16 DIAGNOSIS — Z87891 Personal history of nicotine dependence: Secondary | ICD-10-CM | POA: Diagnosis not present

## 2018-09-16 DIAGNOSIS — K219 Gastro-esophageal reflux disease without esophagitis: Secondary | ICD-10-CM | POA: Diagnosis not present

## 2018-09-16 DIAGNOSIS — R7302 Impaired glucose tolerance (oral): Secondary | ICD-10-CM | POA: Diagnosis not present

## 2018-09-16 DIAGNOSIS — Z9089 Acquired absence of other organs: Secondary | ICD-10-CM | POA: Diagnosis not present

## 2018-09-16 DIAGNOSIS — C61 Malignant neoplasm of prostate: Secondary | ICD-10-CM | POA: Diagnosis present

## 2018-09-16 DIAGNOSIS — Y92239 Unspecified place in hospital as the place of occurrence of the external cause: Secondary | ICD-10-CM | POA: Diagnosis not present

## 2018-09-16 DIAGNOSIS — Z20828 Contact with and (suspected) exposure to other viral communicable diseases: Secondary | ICD-10-CM | POA: Diagnosis not present

## 2018-09-16 DIAGNOSIS — R0602 Shortness of breath: Secondary | ICD-10-CM | POA: Diagnosis not present

## 2018-09-16 DIAGNOSIS — R069 Unspecified abnormalities of breathing: Secondary | ICD-10-CM | POA: Diagnosis not present

## 2018-09-16 DIAGNOSIS — J441 Chronic obstructive pulmonary disease with (acute) exacerbation: Secondary | ICD-10-CM | POA: Diagnosis not present

## 2018-09-16 DIAGNOSIS — Z9079 Acquired absence of other genital organ(s): Secondary | ICD-10-CM | POA: Diagnosis not present

## 2018-09-16 DIAGNOSIS — R0902 Hypoxemia: Secondary | ICD-10-CM | POA: Diagnosis not present

## 2018-09-16 DIAGNOSIS — Z8249 Family history of ischemic heart disease and other diseases of the circulatory system: Secondary | ICD-10-CM

## 2018-09-16 DIAGNOSIS — T380X5A Adverse effect of glucocorticoids and synthetic analogues, initial encounter: Secondary | ICD-10-CM | POA: Diagnosis not present

## 2018-09-16 LAB — COMPREHENSIVE METABOLIC PANEL
ALT: 32 U/L (ref 0–44)
AST: 28 U/L (ref 15–41)
Albumin: 4.6 g/dL (ref 3.5–5.0)
Alkaline Phosphatase: 56 U/L (ref 38–126)
Anion gap: 12 (ref 5–15)
BUN: 22 mg/dL — ABNORMAL HIGH (ref 6–20)
CO2: 24 mmol/L (ref 22–32)
Calcium: 9.3 mg/dL (ref 8.9–10.3)
Chloride: 100 mmol/L (ref 98–111)
Creatinine, Ser: 1.83 mg/dL — ABNORMAL HIGH (ref 0.61–1.24)
GFR calc Af Amer: 46 mL/min — ABNORMAL LOW (ref >60)
GFR calc non Af Amer: 40 mL/min — ABNORMAL LOW (ref >60)
Glucose, Bld: 143 mg/dL — ABNORMAL HIGH (ref 70–99)
Potassium: 4.3 mmol/L (ref 3.5–5.1)
Sodium: 136 mmol/L (ref 135–145)
Total Bilirubin: 1.4 mg/dL — ABNORMAL HIGH (ref 0.3–1.2)
Total Protein: 8.4 g/dL — ABNORMAL HIGH (ref 6.5–8.1)

## 2018-09-16 LAB — GLUCOSE, CAPILLARY: Glucose-Capillary: 181 mg/dL — ABNORMAL HIGH (ref 70–99)

## 2018-09-16 LAB — CBC
HCT: 50.6 % (ref 39.0–52.0)
Hemoglobin: 16.6 g/dL (ref 13.0–17.0)
MCH: 29.8 pg (ref 26.0–34.0)
MCHC: 32.8 g/dL (ref 30.0–36.0)
MCV: 90.8 fL (ref 80.0–100.0)
Platelets: 280 10*3/uL (ref 150–400)
RBC: 5.57 MIL/uL (ref 4.22–5.81)
RDW: 12.9 % (ref 11.5–15.5)
WBC: 9.6 10*3/uL (ref 4.0–10.5)
nRBC: 0 % (ref 0.0–0.2)

## 2018-09-16 LAB — TROPONIN I (HIGH SENSITIVITY)
Troponin I (High Sensitivity): 4 ng/L (ref <18)
Troponin I (High Sensitivity): 3 ng/L (ref <18)

## 2018-09-16 LAB — D-DIMER, QUANTITATIVE: D-Dimer, Quant: 0.52 ug/mL-FEU — ABNORMAL HIGH (ref 0.00–0.50)

## 2018-09-16 LAB — PROCALCITONIN: Procalcitonin: <0.1 ng/mL

## 2018-09-16 LAB — BRAIN NATRIURETIC PEPTIDE: B Natriuretic Peptide: 23 pg/mL (ref 0.0–100.0)

## 2018-09-16 LAB — SARS CORONAVIRUS 2 BY RT PCR (HOSPITAL ORDER, PERFORMED IN ~~LOC~~ HOSPITAL LAB): SARS Coronavirus 2: NEGATIVE

## 2018-09-16 MED ORDER — ALUM & MAG HYDROXIDE-SIMETH 200-200-20 MG/5ML PO SUSP
30.0000 mL | ORAL | Status: DC | PRN
  Administered 2018-09-19: 30 mL via ORAL
  Filled 2018-09-16: qty 30

## 2018-09-16 MED ORDER — INSULIN ASPART 100 UNIT/ML ~~LOC~~ SOLN
0.0000 [IU] | Freq: Three times a day (TID) | SUBCUTANEOUS | Status: DC
  Administered 2018-09-17 (×2): 2 [IU] via SUBCUTANEOUS
  Administered 2018-09-17 – 2018-09-20 (×6): 1 [IU] via SUBCUTANEOUS

## 2018-09-16 MED ORDER — ONDANSETRON HCL 4 MG PO TABS: 4.0000 mg | ORAL_TABLET | Freq: Four times a day (QID) | ORAL | Status: DC | PRN

## 2018-09-16 MED ORDER — BUDESONIDE 0.5 MG/2ML IN SUSP
0.5000 mg | Freq: Two times a day (BID) | RESPIRATORY_TRACT | Status: DC
  Administered 2018-09-16 – 2018-09-21 (×10): 0.5 mg via RESPIRATORY_TRACT
  Filled 2018-09-16 (×10): qty 2

## 2018-09-16 MED ORDER — POLYETHYLENE GLYCOL 3350 17 G PO PACK: 17.0000 g | PACK | Freq: Every day | ORAL | Status: DC | PRN

## 2018-09-16 MED ORDER — POLYVINYL ALCOHOL 1.4 % OP SOLN: 1.0000 [drp] | OPHTHALMIC | Status: DC | PRN

## 2018-09-16 MED ORDER — HYDROCODONE-ACETAMINOPHEN 5-325 MG PO TABS
1.0000 | ORAL_TABLET | ORAL | Status: DC | PRN
  Administered 2018-09-17 – 2018-09-20 (×8): 2 via ORAL
  Filled 2018-09-16 (×8): qty 2

## 2018-09-16 MED ORDER — METHYLPREDNISOLONE SODIUM SUCC 125 MG IJ SOLR
125.0000 mg | Freq: Once | INTRAMUSCULAR | Status: AC
  Administered 2018-09-16: 15:00:00 125 mg via INTRAVENOUS
  Filled 2018-09-16: qty 2

## 2018-09-16 MED ORDER — TORSEMIDE 20 MG PO TABS: 20.0000 mg | ORAL_TABLET | Freq: Every day | ORAL | Status: DC

## 2018-09-16 MED ORDER — INSULIN ASPART 100 UNIT/ML ~~LOC~~ SOLN: 0.0000 [IU] | Freq: Every day | SUBCUTANEOUS | Status: DC

## 2018-09-16 MED ORDER — LIP MEDEX EX OINT
1.0000 "application " | TOPICAL_OINTMENT | CUTANEOUS | Status: DC | PRN
  Filled 2018-09-16: qty 7

## 2018-09-16 MED ORDER — ALBUTEROL SULFATE HFA 108 (90 BASE) MCG/ACT IN AERS
INHALATION_SPRAY | RESPIRATORY_TRACT | Status: AC
  Filled 2018-09-16: qty 6.7

## 2018-09-16 MED ORDER — METHYLPREDNISOLONE SODIUM SUCC 40 MG IJ SOLR
40.0000 mg | Freq: Three times a day (TID) | INTRAMUSCULAR | Status: DC
  Administered 2018-09-16 – 2018-09-17 (×2): 40 mg via INTRAVENOUS
  Filled 2018-09-16 (×2): qty 1

## 2018-09-16 MED ORDER — IPRATROPIUM-ALBUTEROL 0.5-2.5 (3) MG/3ML IN SOLN: 3.0000 mL | RESPIRATORY_TRACT | Status: DC | PRN

## 2018-09-16 MED ORDER — HYDROCORTISONE 1 % EX CREA
1.0000 "application " | TOPICAL_CREAM | Freq: Three times a day (TID) | CUTANEOUS | Status: DC | PRN
  Filled 2018-09-16: qty 28

## 2018-09-16 MED ORDER — HEPARIN SODIUM (PORCINE) 5000 UNIT/ML IJ SOLN
5000.0000 [IU] | Freq: Three times a day (TID) | INTRAMUSCULAR | Status: DC
  Administered 2018-09-16 – 2018-09-20 (×10): 5000 [IU] via SUBCUTANEOUS
  Filled 2018-09-16 (×11): qty 1

## 2018-09-16 MED ORDER — ACETAMINOPHEN 500 MG PO TABS
1000.0000 mg | ORAL_TABLET | Freq: Once | ORAL | Status: AC
  Administered 2018-09-16: 16:00:00 1000 mg via ORAL
  Filled 2018-09-16: qty 2

## 2018-09-16 MED ORDER — IPRATROPIUM-ALBUTEROL 0.5-2.5 (3) MG/3ML IN SOLN
3.0000 mL | Freq: Four times a day (QID) | RESPIRATORY_TRACT | Status: DC
  Administered 2018-09-16 – 2018-09-18 (×6): 3 mL via RESPIRATORY_TRACT
  Filled 2018-09-16 (×6): qty 3

## 2018-09-16 MED ORDER — MAGNESIUM SULFATE 2 GM/50ML IV SOLN
2.0000 g | Freq: Once | INTRAVENOUS | Status: AC
  Administered 2018-09-16: 15:00:00 2 g via INTRAVENOUS
  Filled 2018-09-16: qty 50

## 2018-09-16 MED ORDER — ACETAMINOPHEN 325 MG PO TABS
650.0000 mg | ORAL_TABLET | Freq: Four times a day (QID) | ORAL | Status: DC | PRN
  Administered 2018-09-17 – 2018-09-18 (×3): 650 mg via ORAL
  Filled 2018-09-16 (×3): qty 2

## 2018-09-16 MED ORDER — GUAIFENESIN-DM 100-10 MG/5ML PO SYRP
5.0000 mL | ORAL_SOLUTION | ORAL | Status: DC | PRN
  Administered 2018-09-16 – 2018-09-18 (×2): 5 mL via ORAL
  Filled 2018-09-16 (×2): qty 5

## 2018-09-16 MED ORDER — ALBUTEROL (5 MG/ML) CONTINUOUS INHALATION SOLN
10.0000 mg/h | INHALATION_SOLUTION | Freq: Once | RESPIRATORY_TRACT | Status: AC
  Administered 2018-09-16: 10 mg/h via RESPIRATORY_TRACT
  Filled 2018-09-16: qty 20

## 2018-09-16 MED ORDER — SENNOSIDES-DOCUSATE SODIUM 8.6-50 MG PO TABS: 2.0000 | ORAL_TABLET | Freq: Every evening | ORAL | Status: DC | PRN

## 2018-09-16 MED ORDER — ACETAMINOPHEN 650 MG RE SUPP: 650.0000 mg | Freq: Four times a day (QID) | RECTAL | Status: DC | PRN

## 2018-09-16 MED ORDER — PHENOL 1.4 % MT LIQD: 1.0000 | OROMUCOSAL | Status: DC | PRN

## 2018-09-16 MED ORDER — HYDRALAZINE HCL 20 MG/ML IJ SOLN: 10.0000 mg | INTRAMUSCULAR | Status: DC | PRN

## 2018-09-16 MED ORDER — FUROSEMIDE 10 MG/ML IJ SOLN
40.0000 mg | Freq: Once | INTRAMUSCULAR | Status: AC
  Administered 2018-09-16: 15:00:00 40 mg via INTRAMUSCULAR
  Filled 2018-09-16: qty 4

## 2018-09-16 MED ORDER — SALINE SPRAY 0.65 % NA SOLN: 1.0000 | NASAL | Status: DC | PRN

## 2018-09-16 MED ORDER — ONDANSETRON HCL 4 MG/2ML IJ SOLN: 4.0000 mg | Freq: Four times a day (QID) | INTRAMUSCULAR | Status: DC | PRN

## 2018-09-16 MED ORDER — AEROCHAMBER PLUS FLO-VU MEDIUM MISC
1.0000 | Freq: Once | Status: AC
  Administered 2018-09-16: 14:00:00 1

## 2018-09-16 MED ORDER — INSULIN ASPART 100 UNIT/ML ~~LOC~~ SOLN
3.0000 [IU] | Freq: Three times a day (TID) | SUBCUTANEOUS | Status: DC
  Administered 2018-09-17 – 2018-09-21 (×13): 3 [IU] via SUBCUTANEOUS

## 2018-09-16 MED ORDER — SENNOSIDES-DOCUSATE SODIUM 8.6-50 MG PO TABS: 1.0000 | ORAL_TABLET | Freq: Every evening | ORAL | Status: DC | PRN

## 2018-09-16 MED ORDER — IPRATROPIUM BROMIDE HFA 17 MCG/ACT IN AERS
2.0000 | INHALATION_SPRAY | Freq: Once | RESPIRATORY_TRACT | Status: AC
  Administered 2018-09-16: 15:00:00 2 via RESPIRATORY_TRACT
  Filled 2018-09-16: qty 12.9

## 2018-09-16 MED ORDER — ALBUTEROL SULFATE HFA 108 (90 BASE) MCG/ACT IN AERS
8.0000 | INHALATION_SPRAY | Freq: Once | RESPIRATORY_TRACT | Status: AC
  Administered 2018-09-16: 14:00:00 8 via RESPIRATORY_TRACT

## 2018-09-16 MED ORDER — HYDROCORTISONE (PERIANAL) 2.5 % EX CREA
1.0000 "application " | TOPICAL_CREAM | Freq: Four times a day (QID) | CUTANEOUS | Status: DC | PRN
  Filled 2018-09-16: qty 28.35

## 2018-09-16 MED ORDER — LORATADINE 10 MG PO TABS: 10.0000 mg | ORAL_TABLET | Freq: Every day | ORAL | Status: DC | PRN

## 2018-09-16 MED ORDER — MUSCLE RUB 10-15 % EX CREA
1.0000 "application " | TOPICAL_CREAM | CUTANEOUS | Status: DC | PRN
  Filled 2018-09-16: qty 85

## 2018-09-16 NOTE — ED Triage Notes (Signed)
Pt extremely SOB. Has been having bilateral edema for the last few months. Has not been diagnosed with CHF or anything due to PCP not seeing him due to [place]. CPAP with EMS. Have now transferred him to Non rebreather. Labored breathing. 2 Nitro administered by EMS

## 2018-09-16 NOTE — ED Notes (Signed)
Changed from NRB to O2@2l /m

## 2018-09-16 NOTE — ED Notes (Signed)
Have paged respiratory  

## 2018-09-16 NOTE — ED Notes (Signed)
Tolerating O2@2L /M well, Sats 93-94%.

## 2018-09-16 NOTE — H&P (Signed)
History and Physical    Thomas [name] RCB:[id number] DOB: 1960/06/08 DOA: 09/16/2018  PCP: Asencion Noble, MD Patient coming from: Home  Chief Complaint: Shortness of breath  HPI: Thomas [name] is a 58 y.o. male with medical history significant of past medical history of COPD, essential hypertension, prostate cancer status post resection in remission, renal stone, CKD stage III came to the hospital with complaints of dyspnea.  Patient states that shortness of breath started yesterday evening and has progressively worsened since.  But overall off-and-on has been feeling slight shortness of breath since March.  He also reports of some orthopneic symptoms.  Denies any fevers, chills, hemoptysis or other complaints.  Denies any cold or exposure. Due to the symptoms he does have some chest tightness.  He ended up calling EMS for further help. EMS noted patient was tachycardic, tachypneic with blood pressure systolic in 803O.  He was saturating 90% on room air.  Initially he was placed on CPAP and was given nitroglycerin.  In the ER he was transitioned to nonrebreather on 15 L nasal cannula slowly weaned down to 5 L nasal cannula.  Call with test was negative.  In the ER he was given bronchodilator treatment, Solu-Medrol and magnesium.  Lives at home with his wife who has dementia.  Review of Systems: As per HPI otherwise 10 point review of systems negative.  Review of Systems Otherwise negative except as per HPI, including: General: Denies fever, chills, night sweats or unintended weight loss. Resp: Denies hemoptysis Cardiac: Denies chest pain, palpitations, orthopnea, paroxysmal nocturnal dyspnea. GI: Denies abdominal pain, nausea, vomiting, diarrhea or constipation GU: Denies dysuria, frequency, hesitancy or incontinence MS: Denies muscle aches, joint pain or swelling Neuro: Denies headache, neurologic deficits (focal weakness, numbness, tingling), abnormal gait Psych: Denies anxiety,  depression, SI/HI/AVH Skin: Denies new rashes or lesions ID: Denies sick contacts, exotic exposures, travel  Past Medical History:  Diagnosis Date  . Arthritis    "touch in my neck" (04-Sep-2012)  . COPD (chronic obstructive pulmonary disease) ([place])   . GERD (gastroesophageal reflux disease)   . Hypertension   . Kidney stones    "passed on their own" (09/04/2012)  . Lateral meniscus tear   . Medial meniscus tear   . OSA (obstructive sleep apnea)    "cannot sleep w/mask" (2012/09/04)  . Prostate cancer ([place])   . Shortness of breath    "can happen at anytime" (09-04-12)  . Thyroid nodule    bilaterally/notes 2012/09/04    Past Surgical History:  Procedure Laterality Date  . BACK SURGERY  1990s  . KNEE ARTHROSCOPY WITH MEDIAL MENISECTOMY Right 07/13/2013   Procedure: RIGHT KNEE ARTHROSCOPY WITH MEDIAL AND LATERAL MENISCECTOMY;  Surgeon: Lorn Junes, MD;  Location: [facility];  Service: Orthopedics;  Laterality: Right;  . LUMBAR DISC SURGERY  1990's   "had 2 ruptured discs" (09-04-12)  . NASAL SINUS SURGERY  01/2009  . ROBOT ASSISTED LAPAROSCOPIC RADICAL PROSTATECTOMY  2006  . THYROIDECTOMY Left 04-Sep-2012   Procedure: LEFT PARTIAL THYROIDECTOMY;  Surgeon: Ascencion Dike, MD;  Location: [facility];  Service: ENT;  Laterality: Left;  . THYROIDECTOMY, PARTIAL Left 09/04/12    SOCIAL HISTORY:  reports that he quit smoking about 25 years ago. His smoking use included cigarettes. He has a 37.50 pack-year smoking history. He has never used smokeless tobacco. He reports current alcohol use of about 14.0 standard drinks of alcohol per week. He reports that he does not use drugs.  No  Known Allergies  FAMILY HISTORY: Family History  Problem Relation Age of Onset  . Aneurysm Mother   . Heart attack Father      Prior to Admission medications   Medication Sig Start Date End Date Taking? Authorizing Provider  labetalol (NORMODYNE) 100 MG tablet Take 100 mg by mouth 2 (two)  times daily.   Yes [provider]  Tiotropium Bromide-Olodaterol (STIOLTO RESPIMAT) 2.5-2.5 MCG/ACT AERS Inhale 2 puffs into the lungs daily. 08/26/18  Yes Collene Gobble, MD  torsemide (DEMADEX) 20 MG tablet Take 20 mg by mouth daily.   Yes [provider]    Physical Exam: Vitals:   09/16/18 1600 09/16/18 1630 09/16/18 1650 09/16/18 1700  BP: (!) 149/95 (!) 144/97  (!) 148/97  Pulse: (!) 104 100  99  Resp: 13 13  (!) 9  Temp:      TempSrc:      SpO2: 91% 93% 93% 97%  Weight:      Height:          Constitutional: Milddistress due to shortness of breath, 4 L nasal cannula. Eyes: PERRL, lids and conjunctivae normal ENMT: Mucous membranes are moist. Posterior pharynx clear of any exudate or lesions.Normal dentition.  Neck: normal, supple, no masses, no thyromegaly Respiratory: Bilateral inspiratory and expiratory wheezing Cardiovascular: Regular rate and rhythm, no murmurs / rubs / gallops.  2+ bilateral lower extremity pitting edema. 2+ pedal pulses. No carotid bruits.  Abdomen: no tenderness, no masses palpated. No hepatosplenomegaly. Bowel sounds positive.  Musculoskeletal: no clubbing / cyanosis. No joint deformity upper and lower extremities. Good ROM, no contractures. Normal muscle tone.  Skin: no rashes, lesions, ulcers. No induration Neurologic: CN 2-12 grossly intact. Sensation intact, DTR normal. Strength 5/5 in all 4.  Psychiatric: Normal judgment and insight. Alert and oriented x 3. Normal mood.     Labs on Admission: I have personally reviewed following labs and imaging studies  CBC: Recent Labs  Lab 09/16/18 1536  WBC 9.6  HGB 16.6  HCT 50.6  MCV 90.8  PLT 785   Basic Metabolic Panel: Recent Labs  Lab 09/16/18 1536  NA 136  K 4.3  CL 100  CO2 24  GLUCOSE 143*  BUN 22*  CREATININE 1.83*  CALCIUM 9.3   GFR: Estimated Creatinine Clearance: 59.4 mL/min (A) (by C-G formula based on SCr of 1.83 mg/dL (H)). Liver Function Tests:  Recent Labs  Lab 09/16/18 1536  AST 28  ALT 32  ALKPHOS 56  BILITOT 1.4*  PROT 8.4*  ALBUMIN 4.6   No results for input(s): LIPASE, AMYLASE in the last 168 hours. No results for input(s): AMMONIA in the last 168 hours. Coagulation Profile: No results for input(s): INR, PROTIME in the last 168 hours. Cardiac Enzymes: No results for input(s): CKTOTAL, CKMB, CKMBINDEX, TROPONINI in the last 168 hours. BNP (last 3 results) No results for input(s): PROBNP in the last 8760 hours. HbA1C: No results for input(s): HGBA1C in the last 72 hours. CBG: No results for input(s): GLUCAP in the last 168 hours. Lipid Profile: No results for input(s): CHOL, HDL, LDLCALC, TRIG, CHOLHDL, LDLDIRECT in the last 72 hours. Thyroid Function Tests: No results for input(s): TSH, T4TOTAL, FREET4, T3FREE, THYROIDAB in the last 72 hours. Anemia Panel: No results for input(s): VITAMINB12, FOLATE, FERRITIN, TIBC, IRON, RETICCTPCT in the last 72 hours. Urine analysis:    Component Value Date/Time   COLORURINE YELLOW 10/17/2015 1900   APPEARANCEUR CLEAR 10/17/2015 1900   LABSPEC 1.015 10/17/2015 1900  PHURINE 6.0 10/17/2015 1900   GLUCOSEU NEGATIVE 10/17/2015 1900   HGBUR MODERATE (A) 10/17/2015 1900   BILIRUBINUR NEGATIVE 10/17/2015 1900   KETONESUR NEGATIVE 10/17/2015 1900   PROTEINUR 30 (A) 10/17/2015 1900   NITRITE NEGATIVE 10/17/2015 1900   LEUKOCYTESUR NEGATIVE 10/17/2015 1900   Sepsis Labs: !!!!!!!!!!!!!!!!!!!!!!!!!!!!!!!!!!!!!!!!!!!! @LABRCNTIP (procalcitonin:4,lacticidven:4) ) Recent Results (from the past 240 hour(s))  SARS Coronavirus 2 (CEPHEID- Performed in [facility] hospital lab), Hosp Order     Status: None   Collection Time: 09/16/18  2:22 PM   Specimen: Nasopharyngeal Swab  Result Value Ref Range Status   SARS Coronavirus 2 NEGATIVE NEGATIVE Final    Comment: (NOTE) If result is NEGATIVE SARS-CoV-2 target nucleic acids are NOT DETECTED. The SARS-CoV-2 RNA is generally  detectable in upper and lower  respiratory specimens during the acute phase of infection. The lowest  concentration of SARS-CoV-2 viral copies this assay can detect is 250  copies / mL. A negative result does not preclude SARS-CoV-2 infection  and should not be used as the sole basis for treatment or other  patient management decisions.  A negative result may occur with  improper specimen collection / handling, submission of specimen other  than nasopharyngeal swab, presence of viral mutation(s) within the  areas targeted by this assay, and inadequate number of viral copies  (<250 copies / mL). A negative result must be combined with clinical  observations, patient history, and epidemiological information. If result is POSITIVE SARS-CoV-2 target nucleic acids are DETECTED. The SARS-CoV-2 RNA is generally detectable in upper and lower  respiratory specimens dur ing the acute phase of infection.  Positive  results are indicative of active infection with SARS-CoV-2.  Clinical  correlation with patient history and other diagnostic information is  necessary to determine patient infection status.  Positive results do  not rule out bacterial infection or co-infection with other viruses. If result is PRESUMPTIVE POSTIVE SARS-CoV-2 nucleic acids MAY BE PRESENT.   A presumptive positive result was obtained on the submitted specimen  and confirmed on repeat testing.  While 2019 novel coronavirus  (SARS-CoV-2) nucleic acids may be present in the submitted sample  additional confirmatory testing may be necessary for epidemiological  and / or clinical management purposes  to differentiate between  SARS-CoV-2 and other Sarbecovirus currently known to infect humans.  If clinically indicated additional testing with an alternate test  methodology [phone]) is advised. The SARS-CoV-2 RNA is generally  detectable in upper and lower respiratory sp ecimens during the acute  phase of infection. The  expected result is Negative. Fact Sheet for Patients:  StrictlyIdeas.no Fact Sheet for Healthcare Providers: BankingDealers.co.za This test is not yet approved or cleared by the [place] FDA and has been authorized for detection and/or diagnosis of SARS-CoV-2 by FDA under an Emergency Use Authorization (EUA).  This EUA will remain in effect (meaning this test can be used) for the duration of the COVID-19 declaration under Section 564(b)(1) of the Act, 21 U.S.C. section 360bbb-3(b)(1), unless the authorization is terminated or revoked sooner. Performed at [facility], [address], [place], [place] [address]      Radiological Exams on Admission: Dg Chest Portable 1 View  Result Date: 09/16/2018 CLINICAL DATA:  Chest congestion and shortness of breath. EXAM: PORTABLE CHEST 1 VIEW COMPARISON:  Jul 07, 2018 FINDINGS: Cardiomediastinal silhouette is normal. Mediastinal contours appear intact. There is no evidence of focal airspace consolidation, pleural effusion or pneumothorax. Osseous structures are without acute abnormality. Soft tissues are grossly normal.  IMPRESSION: No active disease. Electronically Signed   By: Fidela Salisbury M.D.   On: 09/16/2018 14:56     All images have been reviewed by me personally.    Assessment/Plan Principal Problem:   COPD with acute exacerbation (HCC) Active Problems:   Hypertension   GERD (gastroesophageal reflux disease)   Prostate cancer (HCC)   CKD (chronic kidney disease), stage III (HCC)   Acute respiratory failure with hypoxia (HCC)    Acute hypoxic respiratory failure initially required CPAP Acute moderate COPD exacerbation with hypoxia - Admit patient to stepdown unit.  Currently requiring 5 to 6 L nasal cannula.  Not on any home oxygen at home. -Nebulizers standing and as needed ordered, IV Solu-Medrol which we will eventually transition to oral prednisone - Accu-Cheks and sliding  scale while on steroids -Chest x-ray=clear, BNP negative -Antibiotics= none, checking ProCal.  - Antitussives as needed -Supplemental oxygen as needed, check ambulatory pulse ox if needed - Continue to provide supportive care - COVID- negative.   Bilateral lower extremity swelling; symmetrical - Echo done last month showed ejection fraction 55 to 60%.  Chest x-ray is clear. May Need LE dopplers to R/O DVT.  - On torsemide 20 mg daily at home, continue  CKD stage III - In the past his creatinine has been about 2, even as high as 6.23.  Currently 1.83.  Continue to monitor.  Avoid nephrotoxic drugs.  Monitor urine output.  Essential hypertension -Hold off on labetalol due to respiratory symptoms, IV hydralazine.  DVT prophylaxis: Hep SQ Code Status: Full  Family Communication: None Disposition Plan:  TBD Consults called: None Admission status: Inpatient Admit to Stepdown   Time Spent: 65 minutes.  >50% of the time was devoted to discussing the patients care, assessment, plan and disposition with other care givers along with counseling the patient about the risks and benefits of treatment.    Ankit Arsenio Loader MD [facility]  If 7PM-7AM, please contact night-coverage [email or web address]  09/16/2018, 5:41 PM

## 2018-09-16 NOTE — ED Provider Notes (Signed)
[facility] EMERGENCY DEPARTMENT Provider Note   CSN: [id number] Arrival date & time: 09/16/18  1401     History   Chief Complaint Chief Complaint  Patient presents with  . Shortness of Breath    HPI EBRAHIM DEREMER is a 58 y.o. male with a hx of COPD, GERD, HTN, OSA, nephrolithiasis, & prostate cancer s/p resection in remission who presents to the ED via EMS for dyspnea that began last night around 1800. Patient reports constant dyspnea & progressively worsening since onset, substantially worse in supine position & with exertion. No alleviating factors. Reports associated wheezing as well as some chest tightness that began just prior to EMS arrival but is improved @ present. Given severity of his sxs 911 was called. Per EMS 90% on RA, tachypnic, tachycardia, hypertensive in the 762U systolic with poor air movement- placed on CPAP & given NTG x 2 with improvement. Taken off CPAP and transitioned to NRB @ 15L maintaining SpO2 on ED arrival.  He has had some congestion, cough, & BLE swelling for a few months now. Denies fever, chest pain at present, N/V/D, unilateral leg pain/swelling, hemoptysis, recent surgery/trauma, recent long travel, hormone use,  or hx of DVT/PE. Hx of prostate cancer in remission, no recent tx for this in past 6 months.    HPI  Past Medical History:  Diagnosis Date  . Arthritis    "touch in my neck" (Aug 26, 2012)  . COPD (chronic obstructive pulmonary disease) ([place])   . GERD (gastroesophageal reflux disease)   . Hypertension   . Kidney stones    "passed on their own" (08-26-2012)  . Lateral meniscus tear   . Medial meniscus tear   . OSA (obstructive sleep apnea)    "cannot sleep w/mask" (08/26/2012)  . Prostate cancer ([place])   . Shortness of breath    "can happen at anytime" (08-26-2012)  . Thyroid nodule    bilaterally/notes 2012/08/26    Patient Active Problem List   Diagnosis Date Noted  . Allergic rhinitis 08-27-18  . ARF (acute renal failure) ([place])  10/17/2015  . Hypertension   . COPD (chronic obstructive pulmonary disease) ([place])   . GERD (gastroesophageal reflux disease)   . Arthritis   . Kidney stones   . Prostate cancer ([place])   . Thyroid nodule   . OSA (obstructive sleep apnea)   . Medial meniscus tear   . Lateral meniscus tear   . Sleep apnea 11/29/2009  . HYPERSOMNIA 09/26/2009  . DYSPNEA 09/26/2009  . PROSTATE CANCER, HX OF 09/26/2009  . HYPERTENSION 09/25/2009    Past Surgical History:  Procedure Laterality Date  . BACK SURGERY  1990s  . KNEE ARTHROSCOPY WITH MEDIAL MENISECTOMY Right 07/13/2013   Procedure: RIGHT KNEE ARTHROSCOPY WITH MEDIAL AND LATERAL MENISCECTOMY;  Surgeon: Lorn Junes, MD;  Location: [facility];  Service: Orthopedics;  Laterality: Right;  . LUMBAR DISC SURGERY  1990's   "had 2 ruptured discs" (2012/08/26)  . NASAL SINUS SURGERY  01/2009  . ROBOT ASSISTED LAPAROSCOPIC RADICAL PROSTATECTOMY  2006  . THYROIDECTOMY Left 08/26/12   Procedure: LEFT PARTIAL THYROIDECTOMY;  Surgeon: Ascencion Dike, MD;  Location: [place];  Service: ENT;  Laterality: Left;  . THYROIDECTOMY, PARTIAL Left 08-26-2012        Home Medications    Prior to Admission medications   Medication Sig Start Date End Date Taking? Authorizing Provider  Tiotropium Bromide-Olodaterol (STIOLTO RESPIMAT) 2.5-2.5 MCG/ACT AERS Inhale 2 puffs into the lungs daily. August 27, 2018  Collene Gobble, MD    Family History Family History  Problem Relation Age of Onset  . Aneurysm Mother   . Heart attack Father     Social History Social History   Tobacco Use  . Smoking status: Former Smoker    Packs/day: 2.50    Years: 15.00    Pack years: 37.50    Types: Cigarettes    Quit date: 03/03/1993    Years since quitting: 25.5  . Smokeless tobacco: Never Used  Substance Use Topics  . Alcohol use: Yes    Alcohol/week: 14.0 standard drinks    Types: 14 Cans of beer per week    Comment: 08/25/2012 "2 beers/day average"  . Drug use:  No     Allergies   Patient has no known allergies.   Review of Systems Review of Systems  Constitutional: Negative for chills and fever.  HENT: Positive for congestion.   Respiratory: Positive for cough, chest tightness (resolved at present), shortness of breath and wheezing.   Cardiovascular: Positive for leg swelling.  Gastrointestinal: Negative for abdominal pain, diarrhea, nausea and vomiting.  Neurological: Negative for syncope.  All other systems reviewed and are negative.   Physical Exam Updated Vital Signs BP (!) 148/97   Pulse 99   Temp 97.9 F (36.6 C) (Oral)   Resp (!) 9   Ht 5\' 10"  (1.778 m)   Wt 129.3 kg   SpO2 97%   BMI 40.89 kg/m   Physical Exam Vitals signs and nursing note reviewed.  Constitutional:      Appearance: He is well-developed. He is obese. He is diaphoretic (mild). He is not toxic-appearing.  HENT:     Head: Normocephalic and atraumatic.  Eyes:     General:        Right eye: No discharge.        Left eye: No discharge.     Conjunctiva/sclera: Conjunctivae normal.  Neck:     Musculoskeletal: Neck supple.     Vascular: No JVD.  Cardiovascular:     Rate and Rhythm: Regular rhythm. Tachycardia present.     Pulses:          Dorsalis pedis pulses are 2+ on the right side and 2+ on the left side.  Pulmonary:     Effort: Respiratory distress present.     Breath sounds: Wheezing (biphasic throughout) present.     Comments: Sitting on edge of the bed leaning forward as he states this helps his breathing.  SpO2 92-97% on 15L NRB.  Poor air movement throughout w/ tight breath sounds.  Abdominal:     General: There is no distension.     Palpations: Abdomen is soft.     Tenderness: There is no abdominal tenderness.  Musculoskeletal:     Comments: 3+ symmetric pitting edema to the lower legs w/o overlying erythema/warmth. No calf tenderness to palpation.   Skin:    General: Skin is warm.     Findings: No rash.  Neurological:     Mental  Status: He is alert.     Comments: Clear speech.   Psychiatric:        Behavior: Behavior normal.    ED Treatments / Results  Labs (all labs ordered are listed, but only abnormal results are displayed) Labs Reviewed  COMPREHENSIVE METABOLIC PANEL - Abnormal; Notable for the following components:      Result Value   Glucose, Bld 143 (*)    BUN 22 (*)    Creatinine, Ser  1.83 (*)    Total Protein 8.4 (*)    Total Bilirubin 1.4 (*)    GFR calc non Af Amer 40 (*)    GFR calc Af Amer 46 (*)    All other components within normal limits  SARS CORONAVIRUS 2 (HOSPITAL ORDER, [facility])  CBC  BRAIN NATRIURETIC PEPTIDE  D-DIMER, QUANTITATIVE (NOT AT [facility], [facility])  MAGNESIUM  CBC  COMPREHENSIVE METABOLIC PANEL  TROPONIN I (HIGH SENSITIVITY)  TROPONIN I (HIGH SENSITIVITY)    EKG EKG Interpretation  Date/Time:  Thursday September 16 2018 14:24:54 EDT Ventricular Rate:  109 PR Interval:    QRS Duration: 96 QT Interval:  335 QTC Calculation: 452 R Axis:   108 Text Interpretation:  Sinus tachycardia Right axis deviation Confirmed by Veryl Speak [phone]) on 09/16/2018 3:46:13 PM   Radiology Dg Chest Portable 1 View  Result Date: 09/16/2018 CLINICAL DATA:  Chest congestion and shortness of breath. EXAM: PORTABLE CHEST 1 VIEW COMPARISON:  Jul 07, 2018 FINDINGS: Cardiomediastinal silhouette is normal. Mediastinal contours appear intact. There is no evidence of focal airspace consolidation, pleural effusion or pneumothorax. Osseous structures are without acute abnormality. Soft tissues are grossly normal. IMPRESSION: No active disease. Electronically Signed   By: Fidela Salisbury M.D.   On: 09/16/2018 14:56    Procedures Procedures (including critical care time)  CRITICAL CARE Performed by: Kennith Maes   Total critical care time: 35 minutes  Critical care time was exclusive of separately billable procedures and treating other patients.  Critical care was  necessary to treat or prevent imminent or life-threatening deterioration.  Critical care was time spent personally by me on the following activities: development of treatment plan with patient and/or surrogate as well as nursing, discussions with consultants, evaluation of patient's response to treatment, examination of patient, obtaining history from patient or surrogate, ordering and performing treatments and interventions, ordering and review of laboratory studies, ordering and review of radiographic studies, pulse oximetry and re-evaluation of patient's condition.  Medications Ordered in ED Medications  ipratropium-albuterol (DUONEB) 0.5-2.5 (3) MG/3ML nebulizer solution 3 mL (has no administration in time range)  ipratropium-albuterol (DUONEB) 0.5-2.5 (3) MG/3ML nebulizer solution 3 mL (has no administration in time range)  budesonide (PULMICORT) nebulizer solution 0.5 mg (has no administration in time range)  polyethylene glycol (MIRALAX / GLYCOLAX) packet 17 g (has no administration in time range)  senna-docusate (Senokot-S) tablet 2 tablet (has no administration in time range)  methylPREDNISolone sodium succinate (SOLU-MEDROL) 40 mg/mL injection 40 mg (has no administration in time range)  loratadine (CLARITIN) tablet 10 mg (has no administration in time range)  lip balm (CARMEX) ointment 1 application (has no administration in time range)  polyvinyl alcohol (LIQUIFILM TEARS) 1.4 % ophthalmic solution 1 drop (has no administration in time range)  hydrocortisone (ANUSOL-HC) 2.5 % rectal cream 1 application (has no administration in time range)  alum & mag hydroxide-simeth (MAALOX/MYLANTA) 200-200-20 MG/5ML suspension 30 mL (has no administration in time range)  hydrocortisone cream 1 % 1 application (has no administration in time range)  Muscle Rub CREA 1 application (has no administration in time range)  sodium chloride (OCEAN) 0.65 % nasal spray 1 spray (has no administration in time  range)  phenol (CHLORASEPTIC) mouth spray 1 spray (has no administration in time range)  hydrALAZINE (APRESOLINE) injection 10 mg (has no administration in time range)  guaiFENesin-dextromethorphan (ROBITUSSIN DM) 100-10 MG/5ML syrup 5 mL (has no administration in time range)  albuterol (VENTOLIN HFA) 108 (90 Base)  MCG/ACT inhaler 8 puff (8 puffs Inhalation Given 09/16/18 1424)  AeroChamber Plus Flo-Vu Medium MISC 1 each (1 each Other Given 09/16/18 1424)  methylPREDNISolone sodium succinate (SOLU-MEDROL) 125 mg/2 mL injection 125 mg (125 mg Intravenous Given 09/16/18 1437)  furosemide (LASIX) injection 40 mg (40 mg Intramuscular Given 09/16/18 1436)  ipratropium (ATROVENT HFA) inhaler 2 puff (2 puffs Inhalation Given 09/16/18 1528)  magnesium sulfate IVPB 2 g 50 mL (0 g Intravenous Stopped 09/16/18 1623)  albuterol (PROVENTIL,VENTOLIN) solution continuous neb (10 mg/hr Nebulization Given 09/16/18 1650)  acetaminophen (TYLENOL) tablet 1,000 mg (1,000 mg Oral Given 09/16/18 1625)     Initial Impression / Assessment and Plan / ED Course  I have reviewed the triage vital signs and the nursing notes.  Pertinent labs & imaging results that were available during my care of the patient were reviewed by me and considered in my medical decision making (see chart for details).   Patient presents to the ED w/ dyspnea s/p NTG x 2 & CPAP by EMS on NRB @ 15L.  Vitals w/ tachycardia- BP improved compared to EMS report. He appears mildly diaphoretic & to be in a degree of respiratory distress, he has poor air movement w/ biphasic wheezing, he is tachycardic, & has BLE edema.   DDX: COPD exacerbation, CHF exacerbation, bacterial pneumonia, covid 87, ACS, PE, severe anemia, dissection.  Plan: 8 puffs albuterol & 2 puffs atrovent w/ spacer, mag, & solumedrol. Dr. Roderic Palau has instructed 40 mg of IV Lasix as well. Labs, CXR, EKG ordered. Following interventions will attempt to titrate oxygen requirement. If covid negative  plan for neb tx.   16:00: Patient resting somewhat more comfortably. SpO2 92-96% on 5L via [place]. Remains w/ biphasic wheeze.   CXR: No active disease.  COVID 19 swab negative---> Will start on continuous neb.   CBC: No anemia or leukocytosis.  CMP: Renal function improved from prior on record. No significant electrolyte derangement.  BNP: WNL Trop: WNL EKG: Sinus tach, no STEMI.   Overall suspect sxs related to COPD exacerbation primarily. Continues to have biphasic wheeze, will admit to hospitalist service for further management.   17:35: CONSULT: Discussed w/ hospitalist Dr. Reesa Chew- requesting d-dimer add on to evaluate for PE which I feel is reasonable- this has been ordered, accepts admission.   Findings and plan of care discussed with supervising physician Dr. Roderic Palau who has evaluated patient & is in agreement.   Final Clinical Impressions(s) / ED Diagnoses   Final diagnoses:  COPD exacerbation [facility])  Hypoxia    ED Discharge Orders    None       Amaryllis Dyke, PA-C 09/16/18 1742    Milton Ferguson, MD 09/18/18 1537

## 2018-09-17 ENCOUNTER — Inpatient Hospital Stay (HOSPITAL_COMMUNITY): Payer: BC Managed Care – PPO

## 2018-09-17 DIAGNOSIS — N183 Chronic kidney disease, stage 3 (moderate): Secondary | ICD-10-CM

## 2018-09-17 DIAGNOSIS — J9601 Acute respiratory failure with hypoxia: Secondary | ICD-10-CM

## 2018-09-17 DIAGNOSIS — R600 Localized edema: Secondary | ICD-10-CM

## 2018-09-17 LAB — COMPREHENSIVE METABOLIC PANEL
ALT: 24 U/L (ref 0–44)
AST: 21 U/L (ref 15–41)
Albumin: 4.2 g/dL (ref 3.5–5.0)
Alkaline Phosphatase: 48 U/L (ref 38–126)
Anion gap: 12 (ref 5–15)
BUN: 28 mg/dL — ABNORMAL HIGH (ref 6–20)
CO2: 25 mmol/L (ref 22–32)
Calcium: 9.3 mg/dL (ref 8.9–10.3)
Chloride: 98 mmol/L (ref 98–111)
Creatinine, Ser: 1.95 mg/dL — ABNORMAL HIGH (ref 0.61–1.24)
GFR calc Af Amer: 43 mL/min — ABNORMAL LOW (ref >60)
GFR calc non Af Amer: 37 mL/min — ABNORMAL LOW (ref >60)
Glucose, Bld: 146 mg/dL — ABNORMAL HIGH (ref 70–99)
Potassium: 4 mmol/L (ref 3.5–5.1)
Sodium: 135 mmol/L (ref 135–145)
Total Bilirubin: 1.3 mg/dL — ABNORMAL HIGH (ref 0.3–1.2)
Total Protein: 7.7 g/dL (ref 6.5–8.1)

## 2018-09-17 LAB — CBC
HCT: 47.2 % (ref 39.0–52.0)
Hemoglobin: 15.6 g/dL (ref 13.0–17.0)
MCH: 29.8 pg (ref 26.0–34.0)
MCHC: 33.1 g/dL (ref 30.0–36.0)
MCV: 90.1 fL (ref 80.0–100.0)
Platelets: 271 10*3/uL (ref 150–400)
RBC: 5.24 MIL/uL (ref 4.22–5.81)
RDW: 13.2 % (ref 11.5–15.5)
WBC: 12.5 10*3/uL — ABNORMAL HIGH (ref 4.0–10.5)
nRBC: 0 % (ref 0.0–0.2)

## 2018-09-17 LAB — MRSA PCR SCREENING: MRSA by PCR: NEGATIVE

## 2018-09-17 LAB — GLUCOSE, CAPILLARY
Glucose-Capillary: 173 mg/dL — ABNORMAL HIGH (ref 70–99)
Glucose-Capillary: 135 mg/dL — ABNORMAL HIGH (ref 70–99)
Glucose-Capillary: 157 mg/dL — ABNORMAL HIGH (ref 70–99)
Glucose-Capillary: 141 mg/dL — ABNORMAL HIGH (ref 70–99)

## 2018-09-17 LAB — MAGNESIUM: Magnesium: 2.4 mg/dL (ref 1.7–2.4)

## 2018-09-17 LAB — HIV ANTIBODY (ROUTINE TESTING W REFLEX): HIV Screen 4th Generation wRfx: NONREACTIVE

## 2018-09-17 MED ORDER — CHLORHEXIDINE GLUCONATE CLOTH 2 % EX PADS
6.0000 | MEDICATED_PAD | Freq: Every day | CUTANEOUS | Status: DC
  Administered 2018-09-18: 05:00:00 6 via TOPICAL

## 2018-09-17 MED ORDER — FUROSEMIDE 10 MG/ML IJ SOLN
40.0000 mg | Freq: Every day | INTRAMUSCULAR | Status: DC
  Administered 2018-09-17 – 2018-09-18 (×2): 40 mg via INTRAVENOUS
  Filled 2018-09-17 (×2): qty 4

## 2018-09-17 MED ORDER — TECHNETIUM TO 99M ALBUMIN AGGREGATED
1.5000 | Freq: Once | INTRAVENOUS | Status: AC | PRN
  Administered 2018-09-17: 12:00:00 1.7 via INTRAVENOUS

## 2018-09-17 MED ORDER — METHYLPREDNISOLONE SODIUM SUCC 125 MG IJ SOLR
60.0000 mg | Freq: Three times a day (TID) | INTRAMUSCULAR | Status: AC
  Administered 2018-09-17 – 2018-09-20 (×11): 60 mg via INTRAVENOUS
  Filled 2018-09-17 (×11): qty 2

## 2018-09-17 NOTE — Progress Notes (Signed)
PROGRESS NOTE  Thomas [name] ZOX:[id number] DOB: 12-Aug-1960 DOA: 09/16/2018 PCP: Asencion Noble, MD  Brief History:  58 year old male with a history of COPD, hypertension, prostate cancer, CKD stage III, obstructive sleep apnea presenting with shortness of breath since March 2020.  He states that his shortness of breath is worsened in the past week.  He denies any fevers, chills, nausea, vomiting, diarrhea, chest pain.  He has had a worsening cough with white/tan sputum.  He denies any sick contacts.  In the past week, he is also noted increasing lower extremity edema and increasing abdominal girth.  He is complaining of orthopnea type symptoms.  He has had to sleep in a recliner for the past 2 months.  He quit smoking 1996 after approximately 20-30-pack-year history.  He denies any hemoptysis.  He has been complaining of worsening dyspnea on exertion over the past week.  According to the patient, he went to see a pulmonologist approximately 3 to 4 weeks ago in [place].  He stated he was started on some type of inhaler. In the emergency department, the patient was afebrile hemodynamically stable saturating 90% on room air.  He was placed on 3 L nasal cannula.  BMP showed a serum creatinine 1.3.  WBC was 9.6.  Troponins were unremarkable.  Chest x-ray was negative for any pulmonary edema or infiltrates.  Assessment/Plan: Acute respiratory failure with hypoxia -Secondary to COPD exacerbation -Concerned about a component of fluid overload -Wean oxygen for saturation greater 90%  COPD exacerbation -Increase IV soluMedrol 60 mg IV every 8 hours -Continue duo nebs -Continue Pulmicort  CKD stage III -Baseline creatinine 1.5-1.8 -Monitor with diuresis  Lower extremity edema -Venous duplex -Concerned about a component of right heart failure  Elevated d-dimer -Obtain VQ scan  Essential hypertension -Discontinue labetalol -Start metoprolol  Hyperglycemia -Check hemoglobin A1c  -Elevated in part due to steroids       Disposition Plan:   Home in 2-3 days  Family Communication:  No Family at bedside  Consultants:  none  Code Status:  FULL  DVT Prophylaxis:  [place] Heparin    Procedures: As Listed in Progress Note Above  Antibiotics: None       Subjective: Patient complains of shortness of breath is a little better than yesterday.  Continues to complain of a cough with white sputum.  He denies any nausea, vomiting, diarrhea, domino pain, dysuria, hematuria, headache, neck pain.  Objective: Vitals:   09/17/18 0750 09/17/18 0800 09/17/18 0900 09/17/18 0901  BP:  (!) 144/90 139/90   Pulse:  (!) 105 (!) 113   Resp:  11 18   Temp:  98.1 F (36.7 C)    TempSrc:  Oral    SpO2: 96% 97% (!) 89% 94%  Weight:      Height:        Intake/Output Summary (Last 24 hours) at 09/17/2018 0915 Last data filed at 09/16/2018 2000 Gross per 24 hour  Intake -  Output 1025 ml  Net -1025 ml   Weight change:  Exam:   General:  Pt is alert, follows commands appropriately, not in acute distress  HEENT: No icterus, No thrush, No neck mass, [place]/AT  Cardiovascular: RRR, S1/S2, no rubs, no gallops  Respiratory: Bilateral rales.  Bilateral expiratory wheeze.  Good abdomen.  Abdomen: Soft/+BS, non tender, non distended, no guarding  Extremities: 2 + LE edema, No lymphangitis, No petechiae, No rashes, no synovitis   Data Reviewed: I  have personally reviewed following labs and imaging studies Basic Metabolic Panel: Recent Labs  Lab 09/16/18 1536 09/17/18 0551  NA 136 135  K 4.3 4.0  CL 100 98  CO2 24 25  GLUCOSE 143* 146*  BUN 22* 28*  CREATININE 1.83* 1.95*  CALCIUM 9.3 9.3  MG  --  2.4   Liver Function Tests: Recent Labs  Lab 09/16/18 1536 09/17/18 0551  AST 28 21  ALT 32 24  ALKPHOS 56 48  BILITOT 1.4* 1.3*  PROT 8.4* 7.7  ALBUMIN 4.6 4.2   No results for input(s): LIPASE, AMYLASE in the last 168 hours. No results for input(s):  AMMONIA in the last 168 hours. Coagulation Profile: No results for input(s): INR, PROTIME in the last 168 hours. CBC: Recent Labs  Lab 09/16/18 1536 09/17/18 0551  WBC 9.6 12.5*  HGB 16.6 15.6  HCT 50.6 47.2  MCV 90.8 90.1  PLT 280 271   Cardiac Enzymes: No results for input(s): CKTOTAL, CKMB, CKMBINDEX, TROPONINI in the last 168 hours. BNP: Invalid input(s): POCBNP CBG: Recent Labs  Lab 09/16/18 2310 09/17/18 0749  GLUCAP 181* 173*   HbA1C: No results for input(s): HGBA1C in the last 72 hours. Urine analysis:    Component Value Date/Time   COLORURINE YELLOW 10/17/2015 1900   APPEARANCEUR CLEAR 10/17/2015 1900   LABSPEC 1.015 10/17/2015 1900   PHURINE 6.0 10/17/2015 1900   GLUCOSEU NEGATIVE 10/17/2015 1900   HGBUR MODERATE (A) 10/17/2015 1900   BILIRUBINUR NEGATIVE 10/17/2015 1900   KETONESUR NEGATIVE 10/17/2015 1900   PROTEINUR 30 (A) 10/17/2015 1900   NITRITE NEGATIVE 10/17/2015 1900   LEUKOCYTESUR NEGATIVE 10/17/2015 1900   Sepsis Labs: @LABRCNTIP (procalcitonin:4,lacticidven:4) ) Recent Results (from the past 240 hour(s))  SARS Coronavirus 2 (CEPHEID- Performed in [place] hospital lab), Hosp Order     Status: None   Collection Time: 09/16/18  2:22 PM   Specimen: Nasopharyngeal Swab  Result Value Ref Range Status   SARS Coronavirus 2 NEGATIVE NEGATIVE Final    Comment: (NOTE) If result is NEGATIVE SARS-CoV-2 target nucleic acids are NOT DETECTED. The SARS-CoV-2 RNA is generally detectable in upper and lower  respiratory specimens during the acute phase of infection. The lowest  concentration of SARS-CoV-2 viral copies this assay can detect is 250  copies / mL. A negative result does not preclude SARS-CoV-2 infection  and should not be used as the sole basis for treatment or other  patient management decisions.  A negative result may occur with  improper specimen collection / handling, submission of specimen other  than nasopharyngeal swab, presence  of viral mutation(s) within the  areas targeted by this assay, and inadequate number of viral copies  (<250 copies / mL). A negative result must be combined with clinical  observations, patient history, and epidemiological information. If result is POSITIVE SARS-CoV-2 target nucleic acids are DETECTED. The SARS-CoV-2 RNA is generally detectable in upper and lower  respiratory specimens dur ing the acute phase of infection.  Positive  results are indicative of active infection with SARS-CoV-2.  Clinical  correlation with patient history and other diagnostic information is  necessary to determine patient infection status.  Positive results do  not rule out bacterial infection or co-infection with other viruses. If result is PRESUMPTIVE POSTIVE SARS-CoV-2 nucleic acids MAY BE PRESENT.   A presumptive positive result was obtained on the submitted specimen  and confirmed on repeat testing.  While 2019 novel coronavirus  (SARS-CoV-2) nucleic acids may be present in the submitted  sample  additional confirmatory testing may be necessary for epidemiological  and / or clinical management purposes  to differentiate between  SARS-CoV-2 and other Sarbecovirus currently known to infect humans.  If clinically indicated additional testing with an alternate test  methodology [phone]) is advised. The SARS-CoV-2 RNA is generally  detectable in upper and lower respiratory sp ecimens during the acute  phase of infection. The expected result is Negative. Fact Sheet for Patients:  StrictlyIdeas.no Fact Sheet for Healthcare Providers: BankingDealers.co.za This test is not yet approved or cleared by the [place] FDA and has been authorized for detection and/or diagnosis of SARS-CoV-2 by FDA under an Emergency Use Authorization (EUA).  This EUA will remain in effect (meaning this test can be used) for the duration of the COVID-19 declaration under Section  564(b)(1) of the Act, 21 U.S.C. section 360bbb-3(b)(1), unless the authorization is terminated or revoked sooner. Performed at [facility], [address], [place], [place] [address]   MRSA PCR Screening     Status: None   Collection Time: 09/16/18  7:42 PM   Specimen: Nasopharyngeal  Result Value Ref Range Status   MRSA by PCR NEGATIVE NEGATIVE Final    Comment:        The GeneXpert MRSA Assay (FDA approved for NASAL specimens only), is one component of a comprehensive MRSA colonization surveillance program. It is not intended to diagnose MRSA infection nor to guide or monitor treatment for MRSA infections. Performed at [facility], [address] [place], [place] [address]      Scheduled Meds: . budesonide (PULMICORT) nebulizer solution  0.5 mg Nebulization BID  . heparin  5,000 Units Subcutaneous Q8H  . insulin aspart  0-5 Units Subcutaneous QHS  . insulin aspart  0-9 Units Subcutaneous TID WC  . insulin aspart  3 Units Subcutaneous TID WC  . ipratropium-albuterol  3 mL Nebulization Q6H  . methylPREDNISolone (SOLU-MEDROL) injection  40 mg Intravenous Q8H  . torsemide  20 mg Oral Daily   Continuous Infusions:  Procedures/Studies: Dg Chest Portable 1 View  Result Date: 09/16/2018 CLINICAL DATA:  Chest congestion and shortness of breath. EXAM: PORTABLE CHEST 1 VIEW COMPARISON:  Jul 07, 2018 FINDINGS: Cardiomediastinal silhouette is normal. Mediastinal contours appear intact. There is no evidence of focal airspace consolidation, pleural effusion or pneumothorax. Osseous structures are without acute abnormality. Soft tissues are grossly normal. IMPRESSION: No active disease. Electronically Signed   By: Fidela Salisbury M.D.   On: 09/16/2018 14:56    Orson Eva, DO  [facility] Pager [phone]  If 7PM-7AM, please contact night-coverage [email or web address] Password [email or web address] 09/17/2018, 9:15 AM   LOS: 1 day

## 2018-09-18 DIAGNOSIS — R0902 Hypoxemia: Secondary | ICD-10-CM

## 2018-09-18 LAB — CBC
HCT: 47.8 % (ref 39.0–52.0)
Hemoglobin: 15.3 g/dL (ref 13.0–17.0)
MCH: 29.4 pg (ref 26.0–34.0)
MCHC: 32 g/dL (ref 30.0–36.0)
MCV: 91.9 fL (ref 80.0–100.0)
Platelets: 280 10*3/uL (ref 150–400)
RBC: 5.2 MIL/uL (ref 4.22–5.81)
RDW: 13.2 % (ref 11.5–15.5)
WBC: 18.7 10*3/uL — ABNORMAL HIGH (ref 4.0–10.5)
nRBC: 0 % (ref 0.0–0.2)

## 2018-09-18 LAB — COMPREHENSIVE METABOLIC PANEL
ALT: 31 U/L (ref 0–44)
AST: 50 U/L — ABNORMAL HIGH (ref 15–41)
Albumin: 4.2 g/dL (ref 3.5–5.0)
Alkaline Phosphatase: 43 U/L (ref 38–126)
Anion gap: 16 — ABNORMAL HIGH (ref 5–15)
BUN: 38 mg/dL — ABNORMAL HIGH (ref 6–20)
CO2: 26 mmol/L (ref 22–32)
Calcium: 10 mg/dL (ref 8.9–10.3)
Chloride: 97 mmol/L — ABNORMAL LOW (ref 98–111)
Creatinine, Ser: 2.02 mg/dL — ABNORMAL HIGH (ref 0.61–1.24)
GFR calc Af Amer: 41 mL/min — ABNORMAL LOW (ref >60)
GFR calc non Af Amer: 35 mL/min — ABNORMAL LOW (ref >60)
Glucose, Bld: 129 mg/dL — ABNORMAL HIGH (ref 70–99)
Potassium: 4.3 mmol/L (ref 3.5–5.1)
Sodium: 139 mmol/L (ref 135–145)
Total Bilirubin: 0.9 mg/dL (ref 0.3–1.2)
Total Protein: 7.3 g/dL (ref 6.5–8.1)

## 2018-09-18 LAB — MAGNESIUM: Magnesium: 2.4 mg/dL (ref 1.7–2.4)

## 2018-09-18 LAB — GLUCOSE, CAPILLARY
Glucose-Capillary: 130 mg/dL — ABNORMAL HIGH (ref 70–99)
Glucose-Capillary: 101 mg/dL — ABNORMAL HIGH (ref 70–99)
Glucose-Capillary: 148 mg/dL — ABNORMAL HIGH (ref 70–99)
Glucose-Capillary: 122 mg/dL — ABNORMAL HIGH (ref 70–99)

## 2018-09-18 MED ORDER — IPRATROPIUM-ALBUTEROL 0.5-2.5 (3) MG/3ML IN SOLN
3.0000 mL | Freq: Four times a day (QID) | RESPIRATORY_TRACT | Status: DC
  Administered 2018-09-18 – 2018-09-20 (×9): 3 mL via RESPIRATORY_TRACT
  Filled 2018-09-18 (×9): qty 3

## 2018-09-18 MED ORDER — METOPROLOL TARTRATE 25 MG PO TABS
25.0000 mg | ORAL_TABLET | Freq: Two times a day (BID) | ORAL | Status: DC
  Administered 2018-09-18 – 2018-09-21 (×7): 25 mg via ORAL
  Filled 2018-09-18 (×7): qty 1

## 2018-09-18 MED ORDER — HYDROCODONE-HOMATROPINE 5-1.5 MG/5ML PO SYRP
5.0000 mL | ORAL_SOLUTION | ORAL | Status: DC | PRN
  Administered 2018-09-18 – 2018-09-19 (×2): 5 mL via ORAL
  Filled 2018-09-18 (×2): qty 5

## 2018-09-18 MED ORDER — MONTELUKAST SODIUM 10 MG PO TABS
10.0000 mg | ORAL_TABLET | Freq: Every day | ORAL | Status: DC
  Administered 2018-09-18 – 2018-09-20 (×2): 10 mg via ORAL
  Filled 2018-09-18 (×2): qty 1

## 2018-09-18 MED ORDER — GUAIFENESIN ER 600 MG PO TB12
600.0000 mg | ORAL_TABLET | Freq: Two times a day (BID) | ORAL | Status: DC
  Administered 2018-09-18 – 2018-09-21 (×7): 600 mg via ORAL
  Filled 2018-09-18 (×7): qty 1

## 2018-09-18 MED ORDER — LORATADINE 10 MG PO TABS: 10.0000 mg | ORAL_TABLET | Freq: Every day | ORAL | Status: DC

## 2018-09-18 NOTE — Progress Notes (Signed)
PROGRESS NOTE  Thomas [name] FVC:[id number] DOB: 09-16-1960 DOA: 09/16/2018 PCP: Asencion Noble, MD  Brief History:  58 year old male with a history of COPD, hypertension, prostate cancer, CKD stage III, obstructive sleep apnea presenting with shortness of breath since March 2020.  He states that his shortness of breath is worsened in the past week.  He denies any fevers, chills, nausea, vomiting, diarrhea, chest pain.  He has had a worsening cough with white/tan sputum.  He denies any sick contacts.  In the past week, he is also noted increasing lower extremity edema and increasing abdominal girth.  He is complaining of orthopnea type symptoms.  He has had to sleep in a recliner for the past 2 months.  He quit smoking 1996 after approximately 20-30-pack-year history.  He denies any hemoptysis.  He has been complaining of worsening dyspnea on exertion over the past week.  According to the patient, he went to see a pulmonologist approximately 3 to 4 weeks ago in [place].  He stated he was started on some type of inhaler. In the emergency department, the patient was afebrile hemodynamically stable saturating 90% on room air.  He was placed on 3 L nasal cannula.  BMP showed a serum creatinine 1.3.  WBC was 9.6.  Troponins were unremarkable.  Chest x-ray was negative for any pulmonary edema or infiltrates.  Assessment/Plan: Acute respiratory failure with hypoxia -Secondary to COPD exacerbation -04/07/10 PFTs--small airway asthma -Concerned about a component of fluid overload -Wean oxygen for saturation greater 90%  COPD exacerbation -Increase IV soluMedrol 60 mg IV every 8 hours -Continue duo nebs -Continue Pulmicort -start hycodan -start mucinex -trial singulair  CKD stage III -Baseline creatinine 1.5-1.8 -Monitor with diuresis  Lower extremity edema -Venous duplex--neg -08/13/18 echo--EF 55-60%, normal RV  Elevated d-dimer -Obtain VQ scan--negative  Essential  hypertension -Discontinue labetalol -Start metoprolol  Hyperglycemia -Check hemoglobin A1c -Elevated in part due to steroids       Disposition Plan:   Home in 2-3 days  Family Communication:  No Family at bedside  Consultants:  none  Code Status:  FULL  DVT Prophylaxis:  [place] Heparin    Procedures: As Listed in Progress Note Above  Antibiotics: None     Subjective: Pt complains of cough with tan sputum.  He is still sob with minimal exertion.  Denies f/c, cp, n/v/d, abd pain.  No dysuria.  Objective: Vitals:   09/18/18 1100 09/18/18 1124 09/18/18 1200 09/18/18 1300  BP: (!) 154/83  (!) 167/95 (!) 140/102  Pulse: (!) 101 (!) 103 (!) 110 (!) 102  Resp: 13 14 19 14   Temp:  98.1 F (36.7 C)    TempSrc:  Oral    SpO2: 91% 94% 90% 91%  Weight:      Height:        Intake/Output Summary (Last 24 hours) at 09/18/2018 1454 Last data filed at 09/18/2018 1300 Gross per 24 hour  Intake 720 ml  Output 1150 ml  Net -430 ml   Weight change: -2.275 kg Exam:   General:  Pt is alert, follows commands appropriately, not in acute distress  HEENT: No icterus, No thrush, No neck mass, [place]/AT  Cardiovascular: RRR, S1/S2, no rubs, no gallops  Respiratory:bilateral rhonchi and wheeze  Abdomen: Soft/+BS, non tender, non distended, no guarding  Extremities: No edema, No lymphangitis, No petechiae, No rashes, no synovitis   Data Reviewed: I have personally reviewed following labs and imaging studies Basic  Metabolic Panel: Recent Labs  Lab 09/16/18 1536 09/17/18 0551 09/18/18 0416  NA 136 135 139  K 4.3 4.0 4.3  CL 100 98 97*  CO2 24 25 26   GLUCOSE 143* 146* 129*  BUN 22* 28* 38*  CREATININE 1.83* 1.95* 2.02*  CALCIUM 9.3 9.3 10.0  MG  --  2.4 2.4   Liver Function Tests: Recent Labs  Lab 09/16/18 1536 09/17/18 0551 09/18/18 0416  AST 28 21 50*  ALT 32 24 31  ALKPHOS 56 48 43  BILITOT 1.4* 1.3* 0.9  PROT 8.4* 7.7 7.3  ALBUMIN 4.6 4.2 4.2    No results for input(s): LIPASE, AMYLASE in the last 168 hours. No results for input(s): AMMONIA in the last 168 hours. Coagulation Profile: No results for input(s): INR, PROTIME in the last 168 hours. CBC: Recent Labs  Lab 09/16/18 1536 09/17/18 0551 09/18/18 0416  WBC 9.6 12.5* 18.7*  HGB 16.6 15.6 15.3  HCT 50.6 47.2 47.8  MCV 90.8 90.1 91.9  PLT 280 271 280   Cardiac Enzymes: No results for input(s): CKTOTAL, CKMB, CKMBINDEX, TROPONINI in the last 168 hours. BNP: Invalid input(s): POCBNP CBG: Recent Labs  Lab 09/17/18 1122 09/17/18 1615 09/17/18 2006 09/18/18 0731 09/18/18 1123  GLUCAP 135* 157* 141* 130* 101*   HbA1C: No results for input(s): HGBA1C in the last 72 hours. Urine analysis:    Component Value Date/Time   COLORURINE YELLOW 10/17/2015 1900   APPEARANCEUR CLEAR 10/17/2015 1900   LABSPEC 1.015 10/17/2015 1900   PHURINE 6.0 10/17/2015 1900   GLUCOSEU NEGATIVE 10/17/2015 1900   HGBUR MODERATE (A) 10/17/2015 1900   BILIRUBINUR NEGATIVE 10/17/2015 1900   KETONESUR NEGATIVE 10/17/2015 1900   PROTEINUR 30 (A) 10/17/2015 1900   NITRITE NEGATIVE 10/17/2015 1900   LEUKOCYTESUR NEGATIVE 10/17/2015 1900   Sepsis Labs: @LABRCNTIP (procalcitonin:4,lacticidven:4) ) Recent Results (from the past 240 hour(s))  SARS Coronavirus 2 (CEPHEID- Performed in [place] hospital lab), Hosp Order     Status: None   Collection Time: 09/16/18  2:22 PM   Specimen: Nasopharyngeal Swab  Result Value Ref Range Status   SARS Coronavirus 2 NEGATIVE NEGATIVE Final    Comment: (NOTE) If result is NEGATIVE SARS-CoV-2 target nucleic acids are NOT DETECTED. The SARS-CoV-2 RNA is generally detectable in upper and lower  respiratory specimens during the acute phase of infection. The lowest  concentration of SARS-CoV-2 viral copies this assay can detect is 250  copies / mL. A negative result does not preclude SARS-CoV-2 infection  and should not be used as the sole basis for  treatment or other  patient management decisions.  A negative result may occur with  improper specimen collection / handling, submission of specimen other  than nasopharyngeal swab, presence of viral mutation(s) within the  areas targeted by this assay, and inadequate number of viral copies  (<250 copies / mL). A negative result must be combined with clinical  observations, patient history, and epidemiological information. If result is POSITIVE SARS-CoV-2 target nucleic acids are DETECTED. The SARS-CoV-2 RNA is generally detectable in upper and lower  respiratory specimens dur ing the acute phase of infection.  Positive  results are indicative of active infection with SARS-CoV-2.  Clinical  correlation with patient history and other diagnostic information is  necessary to determine patient infection status.  Positive results do  not rule out bacterial infection or co-infection with other viruses. If result is PRESUMPTIVE POSTIVE SARS-CoV-2 nucleic acids MAY BE PRESENT.   A presumptive positive result was  obtained on the submitted specimen  and confirmed on repeat testing.  While 2019 novel coronavirus  (SARS-CoV-2) nucleic acids may be present in the submitted sample  additional confirmatory testing may be necessary for epidemiological  and / or clinical management purposes  to differentiate between  SARS-CoV-2 and other Sarbecovirus currently known to infect humans.  If clinically indicated additional testing with an alternate test  methodology [phone]) is advised. The SARS-CoV-2 RNA is generally  detectable in upper and lower respiratory sp ecimens during the acute  phase of infection. The expected result is Negative. Fact Sheet for Patients:  StrictlyIdeas.no Fact Sheet for Healthcare Providers: BankingDealers.co.za This test is not yet approved or cleared by the [place] FDA and has been authorized for detection and/or  diagnosis of SARS-CoV-2 by FDA under an Emergency Use Authorization (EUA).  This EUA will remain in effect (meaning this test can be used) for the duration of the COVID-19 declaration under Section 564(b)(1) of the Act, 21 U.S.C. section 360bbb-3(b)(1), unless the authorization is terminated or revoked sooner. Performed at [facility], [address] [place], [place] [address]   MRSA PCR Screening     Status: None   Collection Time: 09/16/18  7:42 PM   Specimen: Nasopharyngeal  Result Value Ref Range Status   MRSA by PCR NEGATIVE NEGATIVE Final    Comment:        The GeneXpert MRSA Assay (FDA approved for NASAL specimens only), is one component of a comprehensive MRSA colonization surveillance program. It is not intended to diagnose MRSA infection nor to guide or monitor treatment for MRSA infections. Performed at [facility], [address], [place], [place] [address]      Scheduled Meds: . budesonide (PULMICORT) nebulizer solution  0.5 mg Nebulization BID  . Chlorhexidine Gluconate Cloth  6 each Topical Q0600  . guaiFENesin  600 mg Oral BID  . heparin  5,000 Units Subcutaneous Q8H  . insulin aspart  0-5 Units Subcutaneous QHS  . insulin aspart  0-9 Units Subcutaneous TID WC  . insulin aspart  3 Units Subcutaneous TID WC  . ipratropium-albuterol  3 mL Nebulization Q6H WA  . methylPREDNISolone (SOLU-MEDROL) injection  60 mg Intravenous Q8H   Continuous Infusions:  Procedures/Studies: Nm Pulmonary Perfusion  Result Date: 09/17/2018 CLINICAL DATA:  Short of breath few days with associated chest pain. EXAM: NUCLEAR MEDICINE PERFUSION LUNG SCAN TECHNIQUE: Perfusion images were obtained in multiple projections after intravenous injection of radiopharmaceutical. Ventilation scans intentionally deferred if perfusion scan and chest x-ray adequate for interpretation during COVID 19 epidemic. RADIOPHARMACEUTICALS:  1.7 mCi Tc-65m MAA IV COMPARISON:  Chest radiograph, 09/16/2018  FINDINGS: The lungs demonstrate homogeneous bilateral perfusion.  No defects. IMPRESSION: Normal lung perfusion study. No evidence of pulmonary thromboembolism. Electronically Signed   By: Lajean Manes M.D.   On: 09/17/2018 12:44   US Venous Img Lower Bilateral  Result Date: 09/17/2018 CLINICAL DATA:  Edema EXAM: BILATERAL LOWER EXTREMITY VENOUS DOPPLER ULTRASOUND TECHNIQUE: Gray-scale sonography with compression, as well as color and duplex ultrasound, were performed to evaluate the deep venous system from the level of the common femoral vein through the popliteal and proximal calf veins. COMPARISON:  05/05/2017 FINDINGS: Normal compressibility of the common femoral, superficial femoral, and popliteal veins, as well as the proximal calf veins. No filling defects to suggest DVT on grayscale or color Doppler imaging. Doppler waveforms show normal direction of venous flow, normal respiratory phasicity and response to augmentation. Visualized segments of the saphenous venous systems normal  in caliber and compressibility. IMPRESSION: No femoropopliteal and no calf DVT in the visualized calf veins. If clinical symptoms are inconsistent or if there are persistent or worsening symptoms, further imaging (possibly involving the iliac veins) may be warranted. Electronically Signed   By: Lucrezia Europe M.D.   On: 09/17/2018 14:40   Dg Chest Portable 1 View  Result Date: 09/16/2018 CLINICAL DATA:  Chest congestion and shortness of breath. EXAM: PORTABLE CHEST 1 VIEW COMPARISON:  Jul 07, 2018 FINDINGS: Cardiomediastinal silhouette is normal. Mediastinal contours appear intact. There is no evidence of focal airspace consolidation, pleural effusion or pneumothorax. Osseous structures are without acute abnormality. Soft tissues are grossly normal. IMPRESSION: No active disease. Electronically Signed   By: Fidela Salisbury M.D.   On: 09/16/2018 14:56    Orson Eva, DO  [facility] Pager [phone]  If 7PM-7AM,  please contact night-coverage [email or web address] Password [email or web address] 09/18/2018, 2:54 PM   LOS: 2 days

## 2018-09-19 DIAGNOSIS — I10 Essential (primary) hypertension: Secondary | ICD-10-CM

## 2018-09-19 LAB — CBC
HCT: 48 % (ref 39.0–52.0)
Hemoglobin: 15.5 g/dL (ref 13.0–17.0)
MCH: 29.6 pg (ref 26.0–34.0)
MCHC: 32.3 g/dL (ref 30.0–36.0)
MCV: 91.8 fL (ref 80.0–100.0)
Platelets: 290 10*3/uL (ref 150–400)
RBC: 5.23 MIL/uL (ref 4.22–5.81)
RDW: 13.2 % (ref 11.5–15.5)
WBC: 15.2 10*3/uL — ABNORMAL HIGH (ref 4.0–10.5)
nRBC: 0 % (ref 0.0–0.2)

## 2018-09-19 LAB — COMPREHENSIVE METABOLIC PANEL
ALT: 33 U/L (ref 0–44)
AST: 47 U/L — ABNORMAL HIGH (ref 15–41)
Albumin: 3.9 g/dL (ref 3.5–5.0)
Alkaline Phosphatase: 40 U/L (ref 38–126)
Anion gap: 14 (ref 5–15)
BUN: 49 mg/dL — ABNORMAL HIGH (ref 6–20)
CO2: 23 mmol/L (ref 22–32)
Calcium: 9.4 mg/dL (ref 8.9–10.3)
Chloride: 100 mmol/L (ref 98–111)
Creatinine, Ser: 2.06 mg/dL — ABNORMAL HIGH (ref 0.61–1.24)
GFR calc Af Amer: 40 mL/min — ABNORMAL LOW (ref >60)
GFR calc non Af Amer: 34 mL/min — ABNORMAL LOW (ref >60)
Glucose, Bld: 118 mg/dL — ABNORMAL HIGH (ref 70–99)
Potassium: 4.3 mmol/L (ref 3.5–5.1)
Sodium: 137 mmol/L (ref 135–145)
Total Bilirubin: 1.3 mg/dL — ABNORMAL HIGH (ref 0.3–1.2)
Total Protein: 7.2 g/dL (ref 6.5–8.1)

## 2018-09-19 LAB — MAGNESIUM: Magnesium: 2.3 mg/dL (ref 1.7–2.4)

## 2018-09-19 LAB — GLUCOSE, CAPILLARY
Glucose-Capillary: 123 mg/dL — ABNORMAL HIGH (ref 70–99)
Glucose-Capillary: 95 mg/dL (ref 70–99)
Glucose-Capillary: 115 mg/dL — ABNORMAL HIGH (ref 70–99)
Glucose-Capillary: 136 mg/dL — ABNORMAL HIGH (ref 70–99)

## 2018-09-19 LAB — HEMOGLOBIN A1C
Hgb A1c MFr Bld: 5.7 % — ABNORMAL HIGH (ref 4.8–5.6)
Mean Plasma Glucose: 116.89 mg/dL

## 2018-09-19 MED ORDER — PANTOPRAZOLE SODIUM 40 MG PO TBEC
40.0000 mg | DELAYED_RELEASE_TABLET | Freq: Every day | ORAL | Status: DC
  Administered 2018-09-19 – 2018-09-21 (×3): 40 mg via ORAL
  Filled 2018-09-19 (×3): qty 1

## 2018-09-19 MED ORDER — ARFORMOTEROL TARTRATE 15 MCG/2ML IN NEBU
15.0000 ug | INHALATION_SOLUTION | Freq: Two times a day (BID) | RESPIRATORY_TRACT | Status: DC
  Administered 2018-09-19 – 2018-09-21 (×4): 15 ug via RESPIRATORY_TRACT
  Filled 2018-09-19 (×5): qty 2

## 2018-09-19 MED ORDER — LORATADINE 10 MG PO TABS
10.0000 mg | ORAL_TABLET | Freq: Every day | ORAL | Status: DC
  Administered 2018-09-19 – 2018-09-21 (×3): 10 mg via ORAL
  Filled 2018-09-19 (×3): qty 1

## 2018-09-19 NOTE — Progress Notes (Signed)
PROGRESS NOTE  Thomas [name] JQB:[id number] DOB: [birth year] DOA: 09/16/2018 PCP: Asencion Noble, MD  Brief History: 58 year old male with a history of COPD, hypertension, prostate cancer, CKD stage III, obstructive sleep apnea presenting with shortness of breath since March 2020. He states that his shortness of breath is worsened in the past week. He denies any fevers, chills, nausea, vomiting, diarrhea, chest pain. He has had a worsening cough with white/tan sputum. He denies any sick contacts. In the past week, he is also noted increasing lower extremity edema and increasing abdominal girth. He is complaining of orthopnea type symptoms. He has had to sleep in a recliner for the past 2 months. He quit smoking 1996 after approximately 20-30-pack-year history. He denies any hemoptysis. He has been complaining of worsening dyspnea on exertion over the past week. According to the patient, he went to see a pulmonologist approximately 3 to 4 weeks ago in [place]. He stated he was started on some type of inhaler. In the emergency department, the patient was afebrile hemodynamically stable saturating 90% on room air. He was placed on 3 L nasal cannula. BMP showed a serum creatinine 1.3. WBC was 9.6. Troponins were unremarkable. Chest x-ray was negative for any pulmonary edema or infiltrates.  Assessment/Plan: Acute respiratory failure with hypoxia -Secondary to COPD exacerbation -04/07/10 PFTs--small airway asthma -Concerned about a component of fluid overload -Wean oxygen for saturation greater 90% -currently stable on 2L  COPD exacerbation -Increase IVsoluMedrol 60 mg IV every 8 hours -Continue duo nebs -Continue Pulmicort -continue hycodan -continue mucinex -trial singulair -start protonix and claritin -add [place]  CKD stage III -Baseline creatinine 1.5-1.8 -Monitor with diuresis  Lower extremity edema -Venous duplex--neg -08/13/18 echo--EF 55-60%, normal  RV  Elevated d-dimer -Obtain VQ scan--negative  Essential hypertension -Discontinue labetalol -Continue metoprolol  Hyperglycemia/Impaired glucose tolerance -Check hemoglobin A1c--5.7 -impaired glucose tolerance -Elevated in part due tosteroids       Disposition Plan: Home 7/20 or 7/21  Family Communication:NoFamily at bedside  Consultants:none  Code Status: FULL  DVT Prophylaxis: [place] Heparin    Procedures: As Listed in Progress Note Above  Antibiotics: None    Subjective: Pt breathing a little better.  Denies cp, n/v/d, abd pain.  No headache.  No hemoptysis.  C/o nonproductive cough  Objective: Vitals:   09/19/18 0751 09/19/18 0800 09/19/18 0900 09/19/18 1000  BP:  (!) 147/94 (!) 142/91 (!) 150/97  Pulse:  83 94 91  Resp:      Temp:      TempSrc:      SpO2: 96% (!) 87% (!) 89% 90%  Weight:      Height:        Intake/Output Summary (Last 24 hours) at 09/19/2018 1336 Last data filed at 09/19/2018 1000 Gross per 24 hour  Intake 360 ml  Output -  Net 360 ml   Weight change: -0.4 kg Exam:   General:  Pt is alert, follows commands appropriately, not in acute distress  HEENT: No icterus, No thrush, No neck mass, [place]/AT  Cardiovascular: RRR, S1/S2, no rubs, no gallops  Respiratory: bilateral scattered rales.  Bibasilar wheeze  Abdomen: Soft/+BS, non tender, non distended, no guarding  Extremities: trace LE edema, No lymphangitis, No petechiae, No rashes, no synovitis   Data Reviewed: I have personally reviewed following labs and imaging studies Basic Metabolic Panel: Recent Labs  Lab 09/16/18 1536 09/17/18 0551 09/18/18 0416 09/19/18 0406  NA 136 135 139 137  K 4.3 4.0 4.3 4.3  CL 100 98 97* 100  CO2 24 25 26 23   GLUCOSE 143* 146* 129* 118*  BUN 22* 28* 38* 49*  CREATININE 1.83* 1.95* 2.02* 2.06*  CALCIUM 9.3 9.3 10.0 9.4  MG  --  2.4 2.4 2.3   Liver Function Tests: Recent Labs  Lab 09/16/18 1536  09/17/18 0551 09/18/18 0416 09/19/18 0406  AST 28 21 50* 47*  ALT 32 24 31 33  ALKPHOS 56 48 43 40  BILITOT 1.4* 1.3* 0.9 1.3*  PROT 8.4* 7.7 7.3 7.2  ALBUMIN 4.6 4.2 4.2 3.9   No results for input(s): LIPASE, AMYLASE in the last 168 hours. No results for input(s): AMMONIA in the last 168 hours. Coagulation Profile: No results for input(s): INR, PROTIME in the last 168 hours. CBC: Recent Labs  Lab 09/16/18 1536 09/17/18 0551 09/18/18 0416 09/19/18 0406  WBC 9.6 12.5* 18.7* 15.2*  HGB 16.6 15.6 15.3 15.5  HCT 50.6 47.2 47.8 48.0  MCV 90.8 90.1 91.9 91.8  PLT 280 271 280 290   Cardiac Enzymes: No results for input(s): CKTOTAL, CKMB, CKMBINDEX, TROPONINI in the last 168 hours. BNP: Invalid input(s): POCBNP CBG: Recent Labs  Lab 09/18/18 1123 09/18/18 1618 09/18/18 2043 09/19/18 0721 09/19/18 1125  GLUCAP 101* 148* 122* 123* 95   HbA1C: Recent Labs    09/19/18 0406  HGBA1C 5.7*   Urine analysis:    Component Value Date/Time   COLORURINE YELLOW 10/17/2015 1900   APPEARANCEUR CLEAR 10/17/2015 1900   LABSPEC 1.015 10/17/2015 1900   PHURINE 6.0 10/17/2015 1900   GLUCOSEU NEGATIVE 10/17/2015 1900   HGBUR MODERATE (A) 10/17/2015 1900   BILIRUBINUR NEGATIVE 10/17/2015 1900   KETONESUR NEGATIVE 10/17/2015 1900   PROTEINUR 30 (A) 10/17/2015 1900   NITRITE NEGATIVE 10/17/2015 1900   LEUKOCYTESUR NEGATIVE 10/17/2015 1900   Sepsis Labs: @LABRCNTIP (procalcitonin:4,lacticidven:4) ) Recent Results (from the past 240 hour(s))  SARS Coronavirus 2 (CEPHEID- Performed in [place] hospital lab), Hosp Order     Status: None   Collection Time: 09/16/18  2:22 PM   Specimen: Nasopharyngeal Swab  Result Value Ref Range Status   SARS Coronavirus 2 NEGATIVE NEGATIVE Final    Comment: (NOTE) If result is NEGATIVE SARS-CoV-2 target nucleic acids are NOT DETECTED. The SARS-CoV-2 RNA is generally detectable in upper and lower  respiratory specimens during the acute phase  of infection. The lowest  concentration of SARS-CoV-2 viral copies this assay can detect is 250  copies / mL. A negative result does not preclude SARS-CoV-2 infection  and should not be used as the sole basis for treatment or other  patient management decisions.  A negative result may occur with  improper specimen collection / handling, submission of specimen other  than nasopharyngeal swab, presence of viral mutation(s) within the  areas targeted by this assay, and inadequate number of viral copies  (<250 copies / mL). A negative result must be combined with clinical  observations, patient history, and epidemiological information. If result is POSITIVE SARS-CoV-2 target nucleic acids are DETECTED. The SARS-CoV-2 RNA is generally detectable in upper and lower  respiratory specimens dur ing the acute phase of infection.  Positive  results are indicative of active infection with SARS-CoV-2.  Clinical  correlation with patient history and other diagnostic information is  necessary to determine patient infection status.  Positive results do  not rule out bacterial infection or co-infection with other viruses. If result is PRESUMPTIVE POSTIVE SARS-CoV-2 nucleic acids MAY BE PRESENT.  A presumptive positive result was obtained on the submitted specimen  and confirmed on repeat testing.  While 2019 novel coronavirus  (SARS-CoV-2) nucleic acids may be present in the submitted sample  additional confirmatory testing may be necessary for epidemiological  and / or clinical management purposes  to differentiate between  SARS-CoV-2 and other Sarbecovirus currently known to infect humans.  If clinically indicated additional testing with an alternate test  methodology [phone]) is advised. The SARS-CoV-2 RNA is generally  detectable in upper and lower respiratory sp ecimens during the acute  phase of infection. The expected result is Negative. Fact Sheet for Patients:   StrictlyIdeas.no Fact Sheet for Healthcare Providers: BankingDealers.co.za This test is not yet approved or cleared by the [place] FDA and has been authorized for detection and/or diagnosis of SARS-CoV-2 by FDA under an Emergency Use Authorization (EUA).  This EUA will remain in effect (meaning this test can be used) for the duration of the COVID-19 declaration under Section 564(b)(1) of the Act, 21 U.S.C. section 360bbb-3(b)(1), unless the authorization is terminated or revoked sooner. Performed at [facility], [address] [place], [place] [address]   MRSA PCR Screening     Status: None   Collection Time: 09/16/18  7:42 PM   Specimen: Nasopharyngeal  Result Value Ref Range Status   MRSA by PCR NEGATIVE NEGATIVE Final    Comment:        The GeneXpert MRSA Assay (FDA approved for NASAL specimens only), is one component of a comprehensive MRSA colonization surveillance program. It is not intended to diagnose MRSA infection nor to guide or monitor treatment for MRSA infections. Performed at [facility], [address], [place], [place] [address]      Scheduled Meds: . arformoterol  15 mcg Nebulization BID  . budesonide (PULMICORT) nebulizer solution  0.5 mg Nebulization BID  . Chlorhexidine Gluconate Cloth  6 each Topical Q0600  . guaiFENesin  600 mg Oral BID  . heparin  5,000 Units Subcutaneous Q8H  . insulin aspart  0-5 Units Subcutaneous QHS  . insulin aspart  0-9 Units Subcutaneous TID WC  . insulin aspart  3 Units Subcutaneous TID WC  . ipratropium-albuterol  3 mL Nebulization Q6H WA  . methylPREDNISolone (SOLU-MEDROL) injection  60 mg Intravenous Q8H  . metoprolol tartrate  25 mg Oral BID  . montelukast  10 mg Oral QHS  . pantoprazole  40 mg Oral Daily   Continuous Infusions:  Procedures/Studies: Nm Pulmonary Perfusion  Result Date: 09/17/2018 CLINICAL DATA:  Short of breath few days with associated chest  pain. EXAM: NUCLEAR MEDICINE PERFUSION LUNG SCAN TECHNIQUE: Perfusion images were obtained in multiple projections after intravenous injection of radiopharmaceutical. Ventilation scans intentionally deferred if perfusion scan and chest x-ray adequate for interpretation during COVID 19 epidemic. RADIOPHARMACEUTICALS:  1.7 mCi Tc-32m MAA IV COMPARISON:  Chest radiograph, 09/16/2018 FINDINGS: The lungs demonstrate homogeneous bilateral perfusion.  No defects. IMPRESSION: Normal lung perfusion study. No evidence of pulmonary thromboembolism. Electronically Signed   By: Lajean Manes M.D.   On: 09/17/2018 12:44   US Venous Img Lower Bilateral  Result Date: 09/17/2018 CLINICAL DATA:  Edema EXAM: BILATERAL LOWER EXTREMITY VENOUS DOPPLER ULTRASOUND TECHNIQUE: Gray-scale sonography with compression, as well as color and duplex ultrasound, were performed to evaluate the deep venous system from the level of the common femoral vein through the popliteal and proximal calf veins. COMPARISON:  05/05/2017 FINDINGS: Normal compressibility of the common femoral, superficial femoral, and popliteal veins, as well as the  proximal calf veins. No filling defects to suggest DVT on grayscale or color Doppler imaging. Doppler waveforms show normal direction of venous flow, normal respiratory phasicity and response to augmentation. Visualized segments of the saphenous venous systems normal in caliber and compressibility. IMPRESSION: No femoropopliteal and no calf DVT in the visualized calf veins. If clinical symptoms are inconsistent or if there are persistent or worsening symptoms, further imaging (possibly involving the iliac veins) may be warranted. Electronically Signed   By: Lucrezia Europe M.D.   On: 09/17/2018 14:40   Dg Chest Portable 1 View  Result Date: 09/16/2018 CLINICAL DATA:  Chest congestion and shortness of breath. EXAM: PORTABLE CHEST 1 VIEW COMPARISON:  Jul 07, 2018 FINDINGS: Cardiomediastinal silhouette is normal.  Mediastinal contours appear intact. There is no evidence of focal airspace consolidation, pleural effusion or pneumothorax. Osseous structures are without acute abnormality. Soft tissues are grossly normal. IMPRESSION: No active disease. Electronically Signed   By: Fidela Salisbury M.D.   On: 09/16/2018 14:56    Orson Eva, DO  [facility] Pager [phone]  If 7PM-7AM, please contact night-coverage [email or web address] Password [email or web address] 09/19/2018, 1:36 PM   LOS: 3 days

## 2018-09-20 LAB — CBC
HCT: 48.4 % (ref 39.0–52.0)
Hemoglobin: 15.7 g/dL (ref 13.0–17.0)
MCH: 29.3 pg (ref 26.0–34.0)
MCHC: 32.4 g/dL (ref 30.0–36.0)
MCV: 90.3 fL (ref 80.0–100.0)
Platelets: 267 10*3/uL (ref 150–400)
RBC: 5.36 MIL/uL (ref 4.22–5.81)
RDW: 13.2 % (ref 11.5–15.5)
WBC: 13.1 10*3/uL — ABNORMAL HIGH (ref 4.0–10.5)
nRBC: 0 % (ref 0.0–0.2)

## 2018-09-20 LAB — GLUCOSE, CAPILLARY
Glucose-Capillary: 110 mg/dL — ABNORMAL HIGH (ref 70–99)
Glucose-Capillary: 137 mg/dL — ABNORMAL HIGH (ref 70–99)
Glucose-Capillary: 127 mg/dL — ABNORMAL HIGH (ref 70–99)

## 2018-09-20 LAB — PHOSPHORUS: Phosphorus: 4.6 mg/dL (ref 2.5–4.6)

## 2018-09-20 LAB — BASIC METABOLIC PANEL WITH GFR
Anion gap: 12 (ref 5–15)
BUN: 52 mg/dL — ABNORMAL HIGH (ref 6–20)
CO2: 23 mmol/L (ref 22–32)
Calcium: 8.9 mg/dL (ref 8.9–10.3)
Chloride: 102 mmol/L (ref 98–111)
Creatinine, Ser: 1.85 mg/dL — ABNORMAL HIGH (ref 0.61–1.24)
GFR calc Af Amer: 46 mL/min — ABNORMAL LOW
GFR calc non Af Amer: 39 mL/min — ABNORMAL LOW
Glucose, Bld: 115 mg/dL — ABNORMAL HIGH (ref 70–99)
Potassium: 4.1 mmol/L (ref 3.5–5.1)
Sodium: 137 mmol/L (ref 135–145)

## 2018-09-20 LAB — MAGNESIUM: Magnesium: 2.6 mg/dL — ABNORMAL HIGH (ref 1.7–2.4)

## 2018-09-20 LAB — ANA W/REFLEX IF POSITIVE: Anti Nuclear Antibody (ANA): NEGATIVE

## 2018-09-20 MED ORDER — PREDNISONE 20 MG PO TABS
60.0000 mg | ORAL_TABLET | Freq: Every day | ORAL | Status: DC
  Administered 2018-09-21: 09:00:00 60 mg via ORAL
  Filled 2018-09-20: qty 3

## 2018-09-20 MED ORDER — IPRATROPIUM-ALBUTEROL 0.5-2.5 (3) MG/3ML IN SOLN
3.0000 mL | Freq: Three times a day (TID) | RESPIRATORY_TRACT | Status: DC
  Administered 2018-09-21: 09:00:00 3 mL via RESPIRATORY_TRACT
  Filled 2018-09-20: qty 3

## 2018-09-20 NOTE — Progress Notes (Signed)
Pt transferred from ICU to room 323. Pt alert and oriented. Oriented to room, call bell given. VSS. Pt request not to turn on bed alarm. Will continue to monitor.

## 2018-09-20 NOTE — TOC Progression Note (Signed)
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 Pt was discussed in Progression today. Per MD, pt will potentially dc today or tomorrow. Spoke with pt who does not feel he will have any TOC needs for dc. Pt states he has someone caring for his wife at home while he is here.   Will clear this consult.        Expected Discharge Plan and Services                                                 Social Determinants of Health (SDOH) Interventions    Readmission Risk Interventions No flowsheet data found.

## 2018-09-20 NOTE — Progress Notes (Signed)
SATURATION QUALIFICATIONS: (This note is used to comply with regulatory documentation for home oxygen)  Patient Saturations on Room Air at Rest = 94%  Patient Saturations on Room Air while Ambulating = 91%   

## 2018-09-20 NOTE — Progress Notes (Signed)
PROGRESS NOTE  Thomas [name] HCW:[id number] DOB: 1960-09-24 DOA: 09/16/2018 PCP: Asencion Noble, MD   Brief History: 58 year old male with a history of COPD, hypertension, prostate cancer, CKD stage III, obstructive sleep apnea presenting with shortness of breath since March 2020. He states that his shortness of breath is worsened in the past week. He denies any fevers, chills, nausea, vomiting, diarrhea, chest pain. He has had a worsening cough with white/tan sputum. He denies any sick contacts. In the past week, he is also noted increasing lower extremity edema and increasing abdominal girth. He is complaining of orthopnea type symptoms. He has had to sleep in a recliner for the past 2 months. He quit smoking 1996 after approximately 20-30-pack-year history. He denies any hemoptysis. He has been complaining of worsening dyspnea on exertion over the past week. According to the patient, he went to see a pulmonologist approximately 3 to 4 weeks ago in [place]. He stated he was started on some type of inhaler. In the emergency department, the patient was afebrile hemodynamically stable saturating 90% on room air. He was placed on 3 L nasal cannula. BMP showed a serum creatinine 1.3. WBC was 9.6. Troponins were unremarkable. Chest x-ray was negative for any pulmonary edema or infiltrates.  Assessment/Plan: Acute respiratory failure with hypoxia -Secondary to COPD exacerbation -04/07/10 PFTs--small airway asthma -Concerned about a component of fluid overload -Wean oxygen for saturation greater 90% -currently stable on 2L  COPD exacerbation -Increase IVsoluMedrol 60 mg IV every 8 hours -Continue duo nebs -Continue Pulmicort -continue hycodan -continue mucinex -trial singulair -start protonix and claritin -add Brovana  CKD stage III -Baseline creatinine 1.5-1.8 -Monitor with diuresis  Lower extremity edema -Venous duplex--neg -08/13/18 echo--EF 55-60%,  normal RV  Elevated d-dimer -Obtain VQ scan--negative  Essential hypertension -Discontinue labetalol -Continue metoprolol  Hyperglycemia/Impaired glucose tolerance -Check hemoglobin A1c--5.7 -impaired glucose tolerance -Elevated in part due tosteroids       Disposition Plan: Home 7/21 if stable Family Communication:NoFamily at bedside  Consultants:none  Code Status: FULL  DVT Prophylaxis: Avenel Heparin    Procedures: As Listed in Progress Note Above  Antibiotics: None     Subjective: Patient denies fevers, chills, headache, chest pain, dyspnea, nausea, vomiting, diarrhea, abdominal pain, dysuria, hematuria, hematochezia, and melena. Still has some mild dyspnea on exertion  Objective: Vitals:   09/20/18 0816 09/20/18 0817 09/20/18 1500 09/20/18 1512  BP:   (!) 130/97   Pulse:   75   Resp:   18   Temp:   98.2 F (36.8 C)   TempSrc:   Oral   SpO2: 93% 93% 93% 94%  Weight:      Height:        Intake/Output Summary (Last 24 hours) at 09/20/2018 1657 Last data filed at 09/19/2018 2100 Gross per 24 hour  Intake 600 ml  Output -  Net 600 ml   Weight change: 0.8 kg Exam:   General:  Pt is alert, follows commands appropriately, not in acute distress  HEENT: No icterus, No thrush, No neck mass, /AT  Cardiovascular: RRR, S1/S2, no rubs, no gallops  Respiratory: bibasilar rales.  Bibasilar wheeze  Abdomen: Soft/+BS, non tender, non distended, no guarding  Extremities: trace LE edema, No lymphangitis, No petechiae, No rashes, no synovitis   Data Reviewed: I have personally reviewed following labs and imaging studies Basic Metabolic Panel: Recent Labs  Lab 09/16/18 1536 09/17/18 0551 09/18/18 0416 09/19/18 0406 09/20/18 0513  NA  136 135 139 137 137  K 4.3 4.0 4.3 4.3 4.1  CL 100 98 97* 100 102  CO2 24 25 26 23 23   GLUCOSE 143* 146* 129* 118* 115*  BUN 22* 28* 38* 49* 52*  CREATININE 1.83* 1.95* 2.02* 2.06* 1.85*   CALCIUM 9.3 9.3 10.0 9.4 8.9  MG  --  2.4 2.4 2.3 2.6*  PHOS  --   --   --   --  4.6   Liver Function Tests: Recent Labs  Lab 09/16/18 1536 09/17/18 0551 09/18/18 0416 09/19/18 0406  AST 28 21 50* 47*  ALT 32 24 31 33  ALKPHOS 56 48 43 40  BILITOT 1.4* 1.3* 0.9 1.3*  PROT 8.4* 7.7 7.3 7.2  ALBUMIN 4.6 4.2 4.2 3.9   No results for input(s): LIPASE, AMYLASE in the last 168 hours. No results for input(s): AMMONIA in the last 168 hours. Coagulation Profile: No results for input(s): INR, PROTIME in the last 168 hours. CBC: Recent Labs  Lab 09/16/18 1536 09/17/18 0551 09/18/18 0416 09/19/18 0406 09/20/18 0513  WBC 9.6 12.5* 18.7* 15.2* 13.1*  HGB 16.6 15.6 15.3 15.5 15.7  HCT 50.6 47.2 47.8 48.0 48.4  MCV 90.8 90.1 91.9 91.8 90.3  PLT 280 271 280 290 267   Cardiac Enzymes: No results for input(s): CKTOTAL, CKMB, CKMBINDEX, TROPONINI in the last 168 hours. BNP: Invalid input(s): POCBNP CBG: Recent Labs  Lab 09/19/18 1627 09/19/18 2033 09/20/18 0721 09/20/18 1124 09/20/18 1627  GLUCAP 115* 136* 110* 137* 127*   HbA1C: Recent Labs    09/19/18 0406  HGBA1C 5.7*   Urine analysis:    Component Value Date/Time   COLORURINE YELLOW 10/17/2015 1900   APPEARANCEUR CLEAR 10/17/2015 1900   LABSPEC 1.015 10/17/2015 1900   PHURINE 6.0 10/17/2015 1900   GLUCOSEU NEGATIVE 10/17/2015 1900   HGBUR MODERATE (A) 10/17/2015 1900   BILIRUBINUR NEGATIVE 10/17/2015 1900   KETONESUR NEGATIVE 10/17/2015 1900   PROTEINUR 30 (A) 10/17/2015 1900   NITRITE NEGATIVE 10/17/2015 1900   LEUKOCYTESUR NEGATIVE 10/17/2015 1900   Sepsis Labs: @LABRCNTIP (procalcitonin:4,lacticidven:4) ) Recent Results (from the past 240 hour(s))  SARS Coronavirus 2 (CEPHEID- Performed in [place] hospital lab), Hosp Order     Status: None   Collection Time: 09/16/18  2:22 PM   Specimen: Nasopharyngeal Swab  Result Value Ref Range Status   SARS Coronavirus 2 NEGATIVE NEGATIVE Final    Comment:  (NOTE) If result is NEGATIVE SARS-CoV-2 target nucleic acids are NOT DETECTED. The SARS-CoV-2 RNA is generally detectable in upper and lower  respiratory specimens during the acute phase of infection. The lowest  concentration of SARS-CoV-2 viral copies this assay can detect is 250  copies / mL. A negative result does not preclude SARS-CoV-2 infection  and should not be used as the sole basis for treatment or other  patient management decisions.  A negative result may occur with  improper specimen collection / handling, submission of specimen other  than nasopharyngeal swab, presence of viral mutation(s) within the  areas targeted by this assay, and inadequate number of viral copies  (<250 copies / mL). A negative result must be combined with clinical  observations, patient history, and epidemiological information. If result is POSITIVE SARS-CoV-2 target nucleic acids are DETECTED. The SARS-CoV-2 RNA is generally detectable in upper and lower  respiratory specimens dur ing the acute phase of infection.  Positive  results are indicative of active infection with SARS-CoV-2.  Clinical  correlation with patient history and other  diagnostic information is  necessary to determine patient infection status.  Positive results do  not rule out bacterial infection or co-infection with other viruses. If result is PRESUMPTIVE POSTIVE SARS-CoV-2 nucleic acids MAY BE PRESENT.   A presumptive positive result was obtained on the submitted specimen  and confirmed on repeat testing.  While 2019 novel coronavirus  (SARS-CoV-2) nucleic acids may be present in the submitted sample  additional confirmatory testing may be necessary for epidemiological  and / or clinical management purposes  to differentiate between  SARS-CoV-2 and other Sarbecovirus currently known to infect humans.  If clinically indicated additional testing with an alternate test  methodology [phone]) is advised. The SARS-CoV-2 RNA is  generally  detectable in upper and lower respiratory sp ecimens during the acute  phase of infection. The expected result is Negative. Fact Sheet for Patients:  StrictlyIdeas.no Fact Sheet for Healthcare Providers: BankingDealers.co.za This test is not yet approved or cleared by the [place] FDA and has been authorized for detection and/or diagnosis of SARS-CoV-2 by FDA under an Emergency Use Authorization (EUA).  This EUA will remain in effect (meaning this test can be used) for the duration of the COVID-19 declaration under Section 564(b)(1) of the Act, 21 U.S.C. section 360bbb-3(b)(1), unless the authorization is terminated or revoked sooner. Performed at [facility], [address] [place], [place] [address]   MRSA PCR Screening     Status: None   Collection Time: 09/16/18  7:42 PM   Specimen: Nasopharyngeal  Result Value Ref Range Status   MRSA by PCR NEGATIVE NEGATIVE Final    Comment:        The GeneXpert MRSA Assay (FDA approved for NASAL specimens only), is one component of a comprehensive MRSA colonization surveillance program. It is not intended to diagnose MRSA infection nor to guide or monitor treatment for MRSA infections. Performed at [facility], [address] [place], [place] [address]      Scheduled Meds: . arformoterol  15 mcg Nebulization BID  . budesonide (PULMICORT) nebulizer solution  0.5 mg Nebulization BID  . guaiFENesin  600 mg Oral BID  . heparin  5,000 Units Subcutaneous Q8H  . insulin aspart  0-5 Units Subcutaneous QHS  . insulin aspart  0-9 Units Subcutaneous TID WC  . insulin aspart  3 Units Subcutaneous TID WC  . ipratropium-albuterol  3 mL Nebulization Q6H WA  . loratadine  10 mg Oral Daily  . methylPREDNISolone (SOLU-MEDROL) injection  60 mg Intravenous Q8H  . metoprolol tartrate  25 mg Oral BID  . montelukast  10 mg Oral QHS  . pantoprazole  40 mg Oral Daily   Continuous  Infusions:  Procedures/Studies: Nm Pulmonary Perfusion  Result Date: 09/17/2018 CLINICAL DATA:  Short of breath few days with associated chest pain. EXAM: NUCLEAR MEDICINE PERFUSION LUNG SCAN TECHNIQUE: Perfusion images were obtained in multiple projections after intravenous injection of radiopharmaceutical. Ventilation scans intentionally deferred if perfusion scan and chest x-ray adequate for interpretation during COVID 19 epidemic. RADIOPHARMACEUTICALS:  1.7 mCi Tc-67m MAA IV COMPARISON:  Chest radiograph, 09/16/2018 FINDINGS: The lungs demonstrate homogeneous bilateral perfusion.  No defects. IMPRESSION: Normal lung perfusion study. No evidence of pulmonary thromboembolism. Electronically Signed   By: Lajean Manes M.D.   On: 09/17/2018 12:44   US Venous Img Lower Bilateral  Result Date: 09/17/2018 CLINICAL DATA:  Edema EXAM: BILATERAL LOWER EXTREMITY VENOUS DOPPLER ULTRASOUND TECHNIQUE: Gray-scale sonography with compression, as well as color and duplex ultrasound, were performed to evaluate the deep  venous system from the level of the common femoral vein through the popliteal and proximal calf veins. COMPARISON:  05/05/2017 FINDINGS: Normal compressibility of the common femoral, superficial femoral, and popliteal veins, as well as the proximal calf veins. No filling defects to suggest DVT on grayscale or color Doppler imaging. Doppler waveforms show normal direction of venous flow, normal respiratory phasicity and response to augmentation. Visualized segments of the saphenous venous systems normal in caliber and compressibility. IMPRESSION: No femoropopliteal and no calf DVT in the visualized calf veins. If clinical symptoms are inconsistent or if there are persistent or worsening symptoms, further imaging (possibly involving the iliac veins) may be warranted. Electronically Signed   By: Lucrezia Europe M.D.   On: 09/17/2018 14:40   Dg Chest Portable 1 View  Result Date: 09/16/2018 CLINICAL DATA:   Chest congestion and shortness of breath. EXAM: PORTABLE CHEST 1 VIEW COMPARISON:  Jul 07, 2018 FINDINGS: Cardiomediastinal silhouette is normal. Mediastinal contours appear intact. There is no evidence of focal airspace consolidation, pleural effusion or pneumothorax. Osseous structures are without acute abnormality. Soft tissues are grossly normal. IMPRESSION: No active disease. Electronically Signed   By: Fidela Salisbury M.D.   On: 09/16/2018 14:56    Orson Eva, DO  [facility] Pager [phone]  If 7PM-7AM, please contact night-coverage [email or web address] Password [email or web address] 09/20/2018, 4:57 PM   LOS: 4 days

## 2018-09-20 NOTE — Discharge Summary (Signed)
Physician Discharge Summary  Thomas [name] UVO:[id number] DOB: October 25, 1960 DOA: 09/16/2018  PCP: Asencion Noble, MD  Admit date: 09/16/2018 Discharge date: 09/21/2018  Admitted From: Home Disposition:  Home   Recommendations for Outpatient Follow-up:  1. Follow up with PCP in 1-2 weeks 2. Please obtain BMP/CBC in one week    Discharge Condition: Stable CODE STATUS: Diet recommendation: Heart Healthy   Brief/Interim Summary: 58 year old male with a history of COPD, hypertension, prostate cancer, CKD stage III, obstructive sleep apnea presenting with shortness of breath since March 2020. He states that his shortness of breath is worsened in the past week. He denies any fevers, chills, nausea, vomiting, diarrhea, chest pain. He has had a worsening cough with white/tan sputum. He denies any sick contacts. In the past week, he is also noted increasing lower extremity edema and increasing abdominal girth. He is complaining of orthopnea type symptoms. He has had to sleep in a recliner for the past 2 months. He quit smoking 1996 after approximately 20-30-pack-year history. He denies any hemoptysis. He has been complaining of worsening dyspnea on exertion over the past week. According to the patient, he went to see a pulmonologist approximately 3 to 4 weeks ago in [place]. He stated he was started on some type of inhaler. In the emergency department, the patient was afebrile hemodynamically stable saturating 90% on room air. He was placed on 3 L nasal cannula. BMP showed a serum creatinine 1.3. WBC was 9.6. Troponins were unremarkable. Chest x-ray was negative for any pulmonary edema or infiltrates.  Discharge Diagnoses:  Acute respiratory failure with hypoxia -Secondary to COPD exacerbation -04/07/10 PFTs--small airway asthma -Concerned about a component of fluid overload -Wean oxygen for saturation greater 90% -currently stable on 2L>>>RA -ambulatory pulse ox on day of d/c did  not show oxygen desaturation <88%  COPD exacerbation -Increase IVsoluMedrol 60 mg IV every 8 hours -Continue duo nebs -Continue Pulmicort -continuehycodan -continuemucinex -trial singulair -start protonix and claritin -added Brovana -d/c home with prednisone taper -d/c home with albuterol rescue inhaler  CKD stage III -Baseline creatinine 1.5-1.8 -Monitor with diuresis -serum creatinine 1.85 at time of d/c  Lower extremity edema -Venous duplex--neg -08/13/18 echo--EF 55-60%, normal RV  Elevated d-dimer -Obtain VQ scan--negative  Essential hypertension -Discontinue labetalol -Continuemetoprolol 25 mg bid -start amlodipine 5 mg daily  Hyperglycemia/Impaired glucose tolerance -Check hemoglobin A1c--5.7 -impaired glucose tolerance -Elevated in part due tosteroids  Discharge Instructions   Allergies as of 09/21/2018   No Known Allergies     Medication List    STOP taking these medications   labetalol 100 MG tablet Commonly known as: NORMODYNE     TAKE these medications   Albuterol Sulfate 108 (90 Base) MCG/ACT Aepb Inhale 2 puffs into the lungs every 6 (six) hours as needed (sob, wheeze).   amLODipine 5 MG tablet Commonly known as: NORVASC Take 1 tablet (5 mg total) by mouth daily.   metoprolol tartrate 25 MG tablet Commonly known as: LOPRESSOR Take 1 tablet (25 mg total) by mouth 2 (two) times daily.   montelukast 10 MG tablet Commonly known as: SINGULAIR Take 1 tablet (10 mg total) by mouth at bedtime.   predniSONE 10 MG tablet Commonly known as: DELTASONE Take 6 tablets (60 mg total) by mouth daily with breakfast. And decrease by one tablet daily. Start taking on: September 22, 2018   Stiolto Respimat 2.5-2.5 MCG/ACT Aers Generic drug: Tiotropium Bromide-Olodaterol Inhale 2 puffs into the lungs daily.   torsemide 20 MG tablet Commonly known  as: DEMADEX Take 20 mg by mouth daily.       No Known Allergies  Consultations:  none    Procedures/Studies: Nm Pulmonary Perfusion  Result Date: 09/17/2018 CLINICAL DATA:  Short of breath few days with associated chest pain. EXAM: NUCLEAR MEDICINE PERFUSION LUNG SCAN TECHNIQUE: Perfusion images were obtained in multiple projections after intravenous injection of radiopharmaceutical. Ventilation scans intentionally deferred if perfusion scan and chest x-ray adequate for interpretation during COVID 19 epidemic. RADIOPHARMACEUTICALS:  1.7 mCi Tc-55m MAA IV COMPARISON:  Chest radiograph, 09/16/2018 FINDINGS: The lungs demonstrate homogeneous bilateral perfusion.  No defects. IMPRESSION: Normal lung perfusion study. No evidence of pulmonary thromboembolism. Electronically Signed   By: Lajean Manes M.D.   On: 09/17/2018 12:44   US Venous Img Lower Bilateral  Result Date: 09/17/2018 CLINICAL DATA:  Edema EXAM: BILATERAL LOWER EXTREMITY VENOUS DOPPLER ULTRASOUND TECHNIQUE: Gray-scale sonography with compression, as well as color and duplex ultrasound, were performed to evaluate the deep venous system from the level of the common femoral vein through the popliteal and proximal calf veins. COMPARISON:  05/05/2017 FINDINGS: Normal compressibility of the common femoral, superficial femoral, and popliteal veins, as well as the proximal calf veins. No filling defects to suggest DVT on grayscale or color Doppler imaging. Doppler waveforms show normal direction of venous flow, normal respiratory phasicity and response to augmentation. Visualized segments of the saphenous venous systems normal in caliber and compressibility. IMPRESSION: No femoropopliteal and no calf DVT in the visualized calf veins. If clinical symptoms are inconsistent or if there are persistent or worsening symptoms, further imaging (possibly involving the iliac veins) may be warranted. Electronically Signed   By: Lucrezia Europe M.D.   On: 09/17/2018 14:40   Dg Chest Portable 1 View  Result Date: 09/16/2018 CLINICAL DATA:  Chest congestion  and shortness of breath. EXAM: PORTABLE CHEST 1 VIEW COMPARISON:  Jul 07, 2018 FINDINGS: Cardiomediastinal silhouette is normal. Mediastinal contours appear intact. There is no evidence of focal airspace consolidation, pleural effusion or pneumothorax. Osseous structures are without acute abnormality. Soft tissues are grossly normal. IMPRESSION: No active disease. Electronically Signed   By: Fidela Salisbury M.D.   On: 09/16/2018 14:56        Discharge Exam: Vitals:   09/21/18 0852 09/21/18 0857  BP:    Pulse:    Resp:    Temp:    SpO2: 95% 95%   Vitals:   09/21/18 0513 09/21/18 0847 09/21/18 0852 09/21/18 0857  BP: (!) 152/103     Pulse: 70     Resp: 20     Temp: 97.7 F (36.5 C)     TempSrc: Oral     SpO2: 95% 95% 95% 95%  Weight: 127.8 kg     Height:        General: Pt is alert, awake, not in acute distress Cardiovascular: RRR, S1/S2 +, no rubs, no gallops Respiratory: bibasilar rales, minimal basilar wheeze Abdominal: Soft, NT, ND, bowel sounds + Extremities: trace LE edema, no cyanosis   The results of significant diagnostics from this hospitalization (including imaging, microbiology, ancillary and laboratory) are listed below for reference.    Significant Diagnostic Studies: Nm Pulmonary Perfusion  Result Date: 09/17/2018 CLINICAL DATA:  Short of breath few days with associated chest pain. EXAM: NUCLEAR MEDICINE PERFUSION LUNG SCAN TECHNIQUE: Perfusion images were obtained in multiple projections after intravenous injection of radiopharmaceutical. Ventilation scans intentionally deferred if perfusion scan and chest x-ray adequate for interpretation during COVID 19 epidemic. RADIOPHARMACEUTICALS:  1.7  mCi Tc-64m MAA IV COMPARISON:  Chest radiograph, 09/16/2018 FINDINGS: The lungs demonstrate homogeneous bilateral perfusion.  No defects. IMPRESSION: Normal lung perfusion study. No evidence of pulmonary thromboembolism. Electronically Signed   By: Lajean Manes M.D.    On: 09/17/2018 12:44   US Venous Img Lower Bilateral  Result Date: 09/17/2018 CLINICAL DATA:  Edema EXAM: BILATERAL LOWER EXTREMITY VENOUS DOPPLER ULTRASOUND TECHNIQUE: Gray-scale sonography with compression, as well as color and duplex ultrasound, were performed to evaluate the deep venous system from the level of the common femoral vein through the popliteal and proximal calf veins. COMPARISON:  05/05/2017 FINDINGS: Normal compressibility of the common femoral, superficial femoral, and popliteal veins, as well as the proximal calf veins. No filling defects to suggest DVT on grayscale or color Doppler imaging. Doppler waveforms show normal direction of venous flow, normal respiratory phasicity and response to augmentation. Visualized segments of the saphenous venous systems normal in caliber and compressibility. IMPRESSION: No femoropopliteal and no calf DVT in the visualized calf veins. If clinical symptoms are inconsistent or if there are persistent or worsening symptoms, further imaging (possibly involving the iliac veins) may be warranted. Electronically Signed   By: Lucrezia Europe M.D.   On: 09/17/2018 14:40   Dg Chest Portable 1 View  Result Date: 09/16/2018 CLINICAL DATA:  Chest congestion and shortness of breath. EXAM: PORTABLE CHEST 1 VIEW COMPARISON:  Jul 07, 2018 FINDINGS: Cardiomediastinal silhouette is normal. Mediastinal contours appear intact. There is no evidence of focal airspace consolidation, pleural effusion or pneumothorax. Osseous structures are without acute abnormality. Soft tissues are grossly normal. IMPRESSION: No active disease. Electronically Signed   By: Fidela Salisbury M.D.   On: 09/16/2018 14:56     Microbiology: Recent Results (from the past 240 hour(s))  SARS Coronavirus 2 (CEPHEID- Performed in [place] hospital lab), Hosp Order     Status: None   Collection Time: 09/16/18  2:22 PM   Specimen: Nasopharyngeal Swab  Result Value Ref Range Status   SARS  Coronavirus 2 NEGATIVE NEGATIVE Final    Comment: (NOTE) If result is NEGATIVE SARS-CoV-2 target nucleic acids are NOT DETECTED. The SARS-CoV-2 RNA is generally detectable in upper and lower  respiratory specimens during the acute phase of infection. The lowest  concentration of SARS-CoV-2 viral copies this assay can detect is 250  copies / mL. A negative result does not preclude SARS-CoV-2 infection  and should not be used as the sole basis for treatment or other  patient management decisions.  A negative result may occur with  improper specimen collection / handling, submission of specimen other  than nasopharyngeal swab, presence of viral mutation(s) within the  areas targeted by this assay, and inadequate number of viral copies  (<250 copies / mL). A negative result must be combined with clinical  observations, patient history, and epidemiological information. If result is POSITIVE SARS-CoV-2 target nucleic acids are DETECTED. The SARS-CoV-2 RNA is generally detectable in upper and lower  respiratory specimens dur ing the acute phase of infection.  Positive  results are indicative of active infection with SARS-CoV-2.  Clinical  correlation with patient history and other diagnostic information is  necessary to determine patient infection status.  Positive results do  not rule out bacterial infection or co-infection with other viruses. If result is PRESUMPTIVE POSTIVE SARS-CoV-2 nucleic acids MAY BE PRESENT.   A presumptive positive result was obtained on the submitted specimen  and confirmed on repeat testing.  While 2019 novel coronavirus  (SARS-CoV-2) nucleic  acids may be present in the submitted sample  additional confirmatory testing may be necessary for epidemiological  and / or clinical management purposes  to differentiate between  SARS-CoV-2 and other Sarbecovirus currently known to infect humans.  If clinically indicated additional testing with an alternate test   methodology [phone]) is advised. The SARS-CoV-2 RNA is generally  detectable in upper and lower respiratory sp ecimens during the acute  phase of infection. The expected result is Negative. Fact Sheet for Patients:  StrictlyIdeas.no Fact Sheet for Healthcare Providers: BankingDealers.co.za This test is not yet approved or cleared by the [place] FDA and has been authorized for detection and/or diagnosis of SARS-CoV-2 by FDA under an Emergency Use Authorization (EUA).  This EUA will remain in effect (meaning this test can be used) for the duration of the COVID-19 declaration under Section 564(b)(1) of the Act, 21 U.S.C. section 360bbb-3(b)(1), unless the authorization is terminated or revoked sooner. Performed at [facility], [address], [place], [place] [address]   MRSA PCR Screening     Status: None   Collection Time: 09/16/18  7:42 PM   Specimen: Nasopharyngeal  Result Value Ref Range Status   MRSA by PCR NEGATIVE NEGATIVE Final    Comment:        The GeneXpert MRSA Assay (FDA approved for NASAL specimens only), is one component of a comprehensive MRSA colonization surveillance program. It is not intended to diagnose MRSA infection nor to guide or monitor treatment for MRSA infections. Performed at [facility], [address] [place], [place] [address]      Labs: Basic Metabolic Panel: Recent Labs  Lab 09/16/18 1536 09/17/18 0551 09/18/18 0416 09/19/18 0406 09/20/18 0513 09/21/18 0606  NA 136 135 139 137 137  --   K 4.3 4.0 4.3 4.3 4.1  --   CL 100 98 97* 100 102  --   CO2 24 25 26 23 23   --   GLUCOSE 143* 146* 129* 118* 115*  --   BUN 22* 28* 38* 49* 52*  --   CREATININE 1.83* 1.95* 2.02* 2.06* 1.85*  --   CALCIUM 9.3 9.3 10.0 9.4 8.9  --   MG  --  2.4 2.4 2.3 2.6* 2.5*  PHOS  --   --   --   --  4.6  --    Liver Function Tests: Recent Labs  Lab 09/16/18 1536 09/17/18 0551 09/18/18 0416 09/19/18  0406  AST 28 21 50* 47*  ALT 32 24 31 33  ALKPHOS 56 48 43 40  BILITOT 1.4* 1.3* 0.9 1.3*  PROT 8.4* 7.7 7.3 7.2  ALBUMIN 4.6 4.2 4.2 3.9   No results for input(s): LIPASE, AMYLASE in the last 168 hours. No results for input(s): AMMONIA in the last 168 hours. CBC: Recent Labs  Lab 09/17/18 0551 09/18/18 0416 09/19/18 0406 09/20/18 0513 09/21/18 0606  WBC 12.5* 18.7* 15.2* 13.1* 12.6*  HGB 15.6 15.3 15.5 15.7 15.6  HCT 47.2 47.8 48.0 48.4 47.6  MCV 90.1 91.9 91.8 90.3 89.8  PLT 271 280 290 267 259   Cardiac Enzymes: No results for input(s): CKTOTAL, CKMB, CKMBINDEX, TROPONINI in the last 168 hours. BNP: Invalid input(s): POCBNP CBG: Recent Labs  Lab 09/19/18 2033 09/20/18 0721 09/20/18 1124 09/20/18 1627 09/21/18 0751  GLUCAP 136* 110* 137* 127* 139*    Time coordinating discharge:  36 minutes  Signed:  Orson Eva, DO [facility] Pager: [phone] 09/21/2018, 10:33 AM

## 2018-09-21 ENCOUNTER — Telehealth: Payer: Self-pay | Admitting: Emergency Medicine

## 2018-09-21 LAB — GLUCOSE, CAPILLARY: Glucose-Capillary: 139 mg/dL — ABNORMAL HIGH (ref 70–99)

## 2018-09-21 LAB — CBC
HCT: 47.6 % (ref 39.0–52.0)
Hemoglobin: 15.6 g/dL (ref 13.0–17.0)
MCH: 29.4 pg (ref 26.0–34.0)
MCHC: 32.8 g/dL (ref 30.0–36.0)
MCV: 89.8 fL (ref 80.0–100.0)
Platelets: 259 10*3/uL (ref 150–400)
RBC: 5.3 MIL/uL (ref 4.22–5.81)
RDW: 13.1 % (ref 11.5–15.5)
WBC: 12.6 10*3/uL — ABNORMAL HIGH (ref 4.0–10.5)
nRBC: 0 % (ref 0.0–0.2)

## 2018-09-21 LAB — MAGNESIUM: Magnesium: 2.5 mg/dL — ABNORMAL HIGH (ref 1.7–2.4)

## 2018-09-21 MED ORDER — PREDNISONE 10 MG PO TABS: 60.0000 mg | ORAL_TABLET | Freq: Every day | ORAL | 0 refills | Status: DC

## 2018-09-21 MED ORDER — METOPROLOL TARTRATE 25 MG PO TABS: 25.0000 mg | ORAL_TABLET | Freq: Two times a day (BID) | ORAL | 1 refills | Status: AC

## 2018-09-21 MED ORDER — ALBUTEROL SULFATE 108 (90 BASE) MCG/ACT IN AEPB: 2.0000 | INHALATION_SPRAY | Freq: Four times a day (QID) | RESPIRATORY_TRACT | 1 refills | Status: DC | PRN

## 2018-09-21 MED ORDER — AMLODIPINE BESYLATE 5 MG PO TABS: 5.0000 mg | ORAL_TABLET | Freq: Every day | ORAL | 1 refills | Status: DC

## 2018-09-21 MED ORDER — MONTELUKAST SODIUM 10 MG PO TABS: 10.0000 mg | ORAL_TABLET | Freq: Every day | ORAL | 1 refills | Status: DC

## 2018-09-21 NOTE — Progress Notes (Signed)
Discharge instructions reviewed with patient. Given copy of AVS. Prescriptions sent to his pharmacy per MD, patient aware of need to pick them up. Verbalized understanding of instructions, home medications and follow-up. States he will call PCP office to arrange follow-up. IV site d/c'd, site within normal limits. Denies shortness of breath on r/a this am. Awaiting arrival of his ride for discharge home. Instructed to notify nursing staff of her arrival so he can be escorted off unit. Donavan Foil, RN

## 2018-09-22 NOTE — Telephone Encounter (Signed)
Patrice aware pt needs PFT scheduling.  

## 2018-09-22 NOTE — Telephone Encounter (Signed)
Called and left message on pt vm to call back to schedule PFT -pr  °

## 2018-09-23 NOTE — Telephone Encounter (Signed)
Called and left message on pt vm to call back to schedule PFT - 3rd attempt - closed encounter -pr  °

## 2018-09-24 DIAGNOSIS — J449 Chronic obstructive pulmonary disease, unspecified: Secondary | ICD-10-CM | POA: Diagnosis not present

## 2018-09-24 DIAGNOSIS — I10 Essential (primary) hypertension: Secondary | ICD-10-CM | POA: Diagnosis not present

## 2018-09-30 ENCOUNTER — Other Ambulatory Visit: Payer: Self-pay

## 2018-09-30 DIAGNOSIS — R6889 Other general symptoms and signs: Secondary | ICD-10-CM | POA: Diagnosis not present

## 2018-09-30 DIAGNOSIS — Z20822 Contact with and (suspected) exposure to covid-19: Secondary | ICD-10-CM

## 2018-10-02 LAB — NOVEL CORONAVIRUS, NAA: SARS-CoV-2, NAA: NOT DETECTED

## 2018-10-14 ENCOUNTER — Other Ambulatory Visit: Payer: Self-pay | Admitting: Emergency Medicine

## 2018-10-22 ENCOUNTER — Other Ambulatory Visit: Payer: Self-pay

## 2018-10-22 ENCOUNTER — Other Ambulatory Visit (HOSPITAL_COMMUNITY)
Admission: RE | Admit: 2018-10-22 | Discharge: 2018-10-22 | Disposition: A | Discharge status: Home / Self Care | Payer: BC Managed Care – PPO | Source: Ambulatory Visit | Attending: Emergency Medicine | Admitting: Emergency Medicine

## 2018-10-22 DIAGNOSIS — Z20828 Contact with and (suspected) exposure to other viral communicable diseases: Secondary | ICD-10-CM | POA: Diagnosis not present

## 2018-10-22 DIAGNOSIS — Z01812 Encounter for preprocedural laboratory examination: Secondary | ICD-10-CM | POA: Diagnosis not present

## 2018-10-22 LAB — SARS CORONAVIRUS 2 (TAT 6-24 HRS): SARS Coronavirus 2: NEGATIVE

## 2018-10-27 ENCOUNTER — Encounter: Payer: Self-pay | Admitting: Emergency Medicine

## 2018-10-27 ENCOUNTER — Ambulatory Visit (INDEPENDENT_AMBULATORY_CARE_PROVIDER_SITE_OTHER): Payer: BC Managed Care – PPO | Admitting: Emergency Medicine

## 2018-10-27 ENCOUNTER — Other Ambulatory Visit: Payer: Self-pay

## 2018-10-27 DIAGNOSIS — G4733 Obstructive sleep apnea (adult) (pediatric): Secondary | ICD-10-CM | POA: Diagnosis not present

## 2018-10-27 DIAGNOSIS — J449 Chronic obstructive pulmonary disease, unspecified: Secondary | ICD-10-CM

## 2018-10-27 DIAGNOSIS — J301 Allergic rhinitis due to pollen: Secondary | ICD-10-CM

## 2018-10-27 LAB — PULMONARY FUNCTION TEST
DL/VA % pred: 110 %
DL/VA: 4.73 ml/min/mmHg/L
DLCO cor % pred: 109 %
DLCO cor: 30.54 ml/min/mmHg
DLCO unc % pred: 112 %
DLCO unc: 31.37 ml/min/mmHg
FEF 25-75 Post: 3.24 L/sec
FEF 25-75 Pre: 2.25 L/sec
FEF2575-%Change-Post: 43 %
FEF2575-%Pred-Post: 105 %
FEF2575-%Pred-Pre: 73 %
FEV1-%Change-Post: 10 %
FEV1-%Pred-Post: 95 %
FEV1-%Pred-Pre: 86 %
FEV1-Post: 3.51 L
FEV1-Pre: 3.17 L
FEV1FVC-%Change-Post: 9 %
FEV1FVC-%Pred-Pre: 93 %
FEV6-%Change-Post: 2 %
FEV6-%Pred-Post: 97 %
FEV6-%Pred-Pre: 95 %
FEV6-Post: 4.51 L
FEV6-Pre: 4.4 L
FEV6FVC-%Change-Post: 0 %
FEV6FVC-%Pred-Post: 104 %
FEV6FVC-%Pred-Pre: 103 %
FVC-%Change-Post: 1 %
FVC-%Pred-Post: 93 %
FVC-%Pred-Pre: 92 %
FVC-Post: 4.51 L
FVC-Pre: 4.45 L
Post FEV1/FVC ratio: 78 %
Post FEV6/FVC ratio: 100 %
Pre FEV1/FVC ratio: 71 %
Pre FEV6/FVC Ratio: 99 %
RV % pred: 84 %
RV: 1.86 L
TLC % pred: 88 %
TLC: 6.21 L

## 2018-10-27 MED ORDER — BUDESONIDE-FORMOTEROL FUMARATE 160-4.5 MCG/ACT IN AERO: 2.0000 | INHALATION_SPRAY | Freq: Two times a day (BID) | RESPIRATORY_TRACT | 0 refills | Status: DC

## 2018-10-27 NOTE — Assessment & Plan Note (Signed)
His pulmonary function testing confirms obstruction with a significant asthmatic component.  He is not on scheduled bronchodilators currently, felt better after he was treated with corticosteroids on his recent hospitalization.  He is not sure where the Stiolto helped him very much but is difficult to say since he was developing exacerbation during the trial.  I think is reasonable to retry schedule BD, will choose Symbicort given the asthmatic characteristics on his pulmonary function testing.  We will try starting Symbicort 2 puffs twice a day every day on a schedule.  Rinse and gargle after use this medication.  Depending on how you improve on this medication we may decide to continue it through your pharmacy.  Please call us to let us know how you do. Keep albuterol available to use 2 puffs if needed for shortness of breath, chest tightness, wheezing..  Follow with [name] in 6 months or sooner if you have any problems

## 2018-10-27 NOTE — Assessment & Plan Note (Signed)
He is willing to retry using CPAP.  He owns his machine, got it through [employer].  He would need new tubing and mask.  We will try to obtain for him.

## 2018-10-27 NOTE — Progress Notes (Signed)
Patient seen in the office today and instructed on use of Symbicort 160.  Patient expressed understanding and demonstrated technique. 

## 2018-10-27 NOTE — Progress Notes (Signed)
Subjective:    Patient ID: Thomas [name], male    DOB: 09/25/1960, 58 y.o.   MRN: [id number]  HPI 58 year old gentleman with a history of tobacco use (about 40 pack years), hypertension, obstructive sleep apnea not on CPAP (tried to use), prostate cancer.  He carries a diagnosis of COPD with PFT 09/26/2009 that I reviewed that show mild obstruction (FEV1 93% predicted) without a bronchodilator response. Wasn't on BD's  He has developed exertional SOB, difficulty tolerating hot air, increased mucous in his chest especially when supine. This has been happening over the last 3 months. Has has some correlated rhinitis and sinus drainage. He has developed LE edema over the same time period, has had some changes in BP meds over this time. He hears some wheeze, has some cough with clear mucous.   TTE 08/13/2018, normal LV fxn, some LVH, normal RV fxn CXR 5/6 >> normal.   ROV 10/27/2018 --this a follow-up visit for exertional shortness of breath with a history of tobacco use.  Also having increased mucus production, some wheezing.  At his initial visit we tried Stiolto to see if he would get benefit, started loratadine for his rhinitis.  He was admitted 7/16 for an acute exacerbation and was treated with corticosteroids, Mucinex, trial of Singulair. He has albuterol, is using it fairly rarely. No frequent cough, does sometimes get globus sensation. He is considering retrying his CPAP - he still has the device, would need a new mask and hoses.    Review of Systems  Constitutional: Negative for fever and unexpected weight change.  HENT: Negative for congestion, dental problem, ear pain, nosebleeds, postnasal drip, rhinorrhea, sinus pressure, sneezing, sore throat and trouble swallowing.   Eyes: Negative for redness and itching.  Respiratory: Positive for cough, chest tightness and shortness of breath. Negative for wheezing.   Cardiovascular: Positive for chest pain. Negative for palpitations and leg  swelling.  Gastrointestinal: Negative for nausea and vomiting.  Genitourinary: Negative for dysuria.  Musculoskeletal: Negative for joint swelling.  Skin: Negative for rash.  Neurological: Negative for headaches.  Hematological: Does not bruise/bleed easily.  Psychiatric/Behavioral: Negative for dysphoric mood. The patient is not nervous/anxious.    Past Medical History:  Diagnosis Date  . Arthritis    "touch in my neck" (August 31, 2012)  . COPD (chronic obstructive pulmonary disease) ([place])   . GERD (gastroesophageal reflux disease)   . Hypertension   . Kidney stones    "passed on their own" (August 31, 2012)  . Lateral meniscus tear   . Medial meniscus tear   . OSA (obstructive sleep apnea)    "cannot sleep w/mask" (31-Aug-2012)  . Prostate cancer ([place])   . Shortness of breath    "can happen at anytime" (08/31/12)  . Thyroid nodule    bilaterally/notes 08-31-2012     Family History  Problem Relation Age of Onset  . Aneurysm Mother   . Heart attack Father      Social History   Socioeconomic History  . Marital status: Married    Spouse name: Not on file  . Number of children: Not on file  . Years of education: Not on file  . Highest education level: Not on file  Occupational History  . Occupation: Unemployed, 2 years    Employer: UNEMPLOYED    Comment: former general tobacco, [employer]  . Financial resource strain: Not on file  . Food insecurity    Worry: Not on file    Inability: Not on file  .  Transportation needs    Medical: Not on file    Non-medical: Not on file  Tobacco Use  . Smoking status: Former Smoker    Packs/day: 2.50    Years: 15.00    Pack years: 37.50    Types: Cigarettes    Quit date: 03/03/1993    Years since quitting: 25.6  . Smokeless tobacco: Never Used  Substance and Sexual Activity  . Alcohol use: Yes    Alcohol/week: 14.0 standard drinks    Types: 14 Cans of beer per week    Comment: 08/25/2012 "2 beers/day average"  . Drug use:  No  . Sexual activity: Yes  Lifestyle  . Physical activity    Days per week: Not on file    Minutes per session: Not on file  . Stress: Not on file  Relationships  . Social Herbalist on phone: Not on file    Gets together: Not on file    Attends religious service: Not on file    Active member of club or organization: Not on file    Attends meetings of clubs or organizations: Not on file    Relationship status: Not on file  . Intimate partner violence    Fear of current or ex partner: Not on file    Emotionally abused: Not on file    Physically abused: Not on file    Forced sexual activity: Not on file  Other Topics Concern  . Not on file  Social History Narrative   Lives with wife     No Known Allergies   Outpatient Medications Prior to Visit  Medication Sig Dispense Refill  . Albuterol Sulfate 108 (90 Base) MCG/ACT AEPB Inhale 2 puffs into the lungs every 6 (six) hours as needed (sob, wheeze). 1 each 1  . amLODipine (NORVASC) 5 MG tablet Take 1 tablet (5 mg total) by mouth daily. 30 tablet 1  . metoprolol tartrate (LOPRESSOR) 25 MG tablet Take 1 tablet (25 mg total) by mouth 2 (two) times daily. 60 tablet 1  . torsemide (DEMADEX) 20 MG tablet Take 20 mg by mouth daily.    . montelukast (SINGULAIR) 10 MG tablet Take 1 tablet (10 mg total) by mouth at bedtime. 30 tablet 1  . predniSONE (DELTASONE) 10 MG tablet Take 6 tablets (60 mg total) by mouth daily with breakfast. And decrease by one tablet daily. 21 tablet 0  . Tiotropium Bromide-Olodaterol (STIOLTO RESPIMAT) 2.5-2.5 MCG/ACT AERS Inhale 2 puffs into the lungs daily. 8 g 0   No facility-administered medications prior to visit.         Objective:   Physical Exam  Vitals:   10/27/18 1613  BP: 112/68  Pulse: 68  SpO2: 99%  Weight: 277 lb (125.6 kg)  Height: 5\' 10"  (1.778 m)   Gen: Pleasant, obese man, in no distress,  normal affect  ENT: No lesions,  mouth clear,  oropharynx clear, no postnasal drip   Neck: large neck, No JVD, no stridor  Lungs: No use of accessory muscles, no crackles or wheezing on normal respiration, no wheeze on forced expiration, referred UA noise  Cardiovascular: RRR, heart sounds normal, no murmur or gallops, 1+ LE peripheral edema  Musculoskeletal: No deformities, no cyanosis or clubbing  Neuro: alert, awake, non focal  Skin: Warm, no lesions or rash      Assessment & Plan:  COPD (chronic obstructive pulmonary disease) His pulmonary function testing confirms obstruction with a significant asthmatic component.  He is  not on scheduled bronchodilators currently, felt better after he was treated with corticosteroids on his recent hospitalization.  He is not sure where the Stiolto helped him very much but is difficult to say since he was developing exacerbation during the trial.  I think is reasonable to retry schedule BD, will choose Symbicort given the asthmatic characteristics on his pulmonary function testing.  We will try starting Symbicort 2 puffs twice a day every day on a schedule.  Rinse and gargle after use this medication.  Depending on how you improve on this medication we may decide to continue it through your pharmacy.  Please call us to let us know how you do. Keep albuterol available to use 2 puffs if needed for shortness of breath, chest tightness, wheezing..  Follow with [name] in 6 months or sooner if you have any problems  Allergic rhinitis Please restart loratadine (Claritin) 10 mg once daily every day.  OSA (obstructive sleep apnea) He is willing to retry using CPAP.  He owns his machine, got it through [employer].  He would need new tubing and mask.  We will try to obtain for him.  Baltazar Apo, MD, PhD 10/27/2018, 4:33 PM [facility] [phone] or if no answer [phone]

## 2018-10-27 NOTE — Assessment & Plan Note (Signed)
Please restart loratadine (Claritin) 10 mg once daily every day.

## 2018-10-27 NOTE — Patient Instructions (Signed)
We will try starting Symbicort 2 puffs twice a day every day on a schedule.  Rinse and gargle after use this medication.  Depending on how you improve on this medication we may decide to continue it through your pharmacy.  Please call us to let us know how you do. Keep albuterol available to use 2 puffs if needed for shortness of breath, chest tightness, wheezing. Please restart loratadine (Claritin) 10 mg once daily every day. Agree that you would benefit from retrying your CPAP.  We will try to get tubing and mask from [facility].  Follow with [name] in 6 months or sooner if you have any problems

## 2018-10-27 NOTE — Progress Notes (Signed)
PFT done today. 

## 2019-05-04 ENCOUNTER — Encounter (HOSPITAL_COMMUNITY): Payer: Self-pay | Admitting: Emergency Medicine

## 2019-05-04 ENCOUNTER — Emergency Department (HOSPITAL_COMMUNITY): Payer: 59

## 2019-05-04 ENCOUNTER — Inpatient Hospital Stay (HOSPITAL_COMMUNITY)
Admission: EM | Admit: 2019-05-04 | Discharge: 2019-05-08 | DRG: 190 | Disposition: A | Discharge status: Home / Self Care | Payer: 59 | Attending: Internal Medicine | Admitting: Internal Medicine

## 2019-05-04 ENCOUNTER — Other Ambulatory Visit: Payer: Self-pay

## 2019-05-04 DIAGNOSIS — Z6841 Body Mass Index (BMI) 40.0 and over, adult: Secondary | ICD-10-CM

## 2019-05-04 DIAGNOSIS — N1832 Chronic kidney disease, stage 3b: Secondary | ICD-10-CM | POA: Diagnosis present

## 2019-05-04 DIAGNOSIS — J9601 Acute respiratory failure with hypoxia: Secondary | ICD-10-CM | POA: Diagnosis present

## 2019-05-04 DIAGNOSIS — M7989 Other specified soft tissue disorders: Secondary | ICD-10-CM | POA: Diagnosis present

## 2019-05-04 DIAGNOSIS — Z87891 Personal history of nicotine dependence: Secondary | ICD-10-CM

## 2019-05-04 DIAGNOSIS — J441 Chronic obstructive pulmonary disease with (acute) exacerbation: Secondary | ICD-10-CM | POA: Diagnosis not present

## 2019-05-04 DIAGNOSIS — E876 Hypokalemia: Secondary | ICD-10-CM | POA: Diagnosis present

## 2019-05-04 DIAGNOSIS — Z79899 Other long term (current) drug therapy: Secondary | ICD-10-CM

## 2019-05-04 DIAGNOSIS — Z20822 Contact with and (suspected) exposure to covid-19: Secondary | ICD-10-CM | POA: Diagnosis present

## 2019-05-04 DIAGNOSIS — E890 Postprocedural hypothyroidism: Secondary | ICD-10-CM | POA: Diagnosis present

## 2019-05-04 DIAGNOSIS — E66813 Obesity, class 3: Secondary | ICD-10-CM | POA: Diagnosis present

## 2019-05-04 DIAGNOSIS — Z8249 Family history of ischemic heart disease and other diseases of the circulatory system: Secondary | ICD-10-CM

## 2019-05-04 DIAGNOSIS — Z23 Encounter for immunization: Secondary | ICD-10-CM

## 2019-05-04 DIAGNOSIS — R0602 Shortness of breath: Secondary | ICD-10-CM | POA: Diagnosis not present

## 2019-05-04 DIAGNOSIS — I10 Essential (primary) hypertension: Secondary | ICD-10-CM | POA: Diagnosis present

## 2019-05-04 DIAGNOSIS — Z87442 Personal history of urinary calculi: Secondary | ICD-10-CM

## 2019-05-04 DIAGNOSIS — Z7951 Long term (current) use of inhaled steroids: Secondary | ICD-10-CM

## 2019-05-04 DIAGNOSIS — G4733 Obstructive sleep apnea (adult) (pediatric): Secondary | ICD-10-CM | POA: Diagnosis present

## 2019-05-04 DIAGNOSIS — M1909 Primary osteoarthritis, other specified site: Secondary | ICD-10-CM | POA: Diagnosis present

## 2019-05-04 DIAGNOSIS — N183 Chronic kidney disease, stage 3 unspecified: Secondary | ICD-10-CM | POA: Diagnosis present

## 2019-05-04 DIAGNOSIS — K219 Gastro-esophageal reflux disease without esophagitis: Secondary | ICD-10-CM | POA: Diagnosis present

## 2019-05-04 DIAGNOSIS — Z9079 Acquired absence of other genital organ(s): Secondary | ICD-10-CM

## 2019-05-04 DIAGNOSIS — Z8546 Personal history of malignant neoplasm of prostate: Secondary | ICD-10-CM

## 2019-05-04 DIAGNOSIS — I129 Hypertensive chronic kidney disease with stage 1 through stage 4 chronic kidney disease, or unspecified chronic kidney disease: Secondary | ICD-10-CM | POA: Diagnosis present

## 2019-05-04 LAB — CBC WITH DIFFERENTIAL/PLATELET
Abs Immature Granulocytes: 0.03 10*3/uL (ref 0.00–0.07)
Basophils Absolute: 0.1 10*3/uL (ref 0.0–0.1)
Basophils Relative: 1 %
Eosinophils Absolute: 1.2 10*3/uL — ABNORMAL HIGH (ref 0.0–0.5)
Eosinophils Relative: 14 %
HCT: 44.7 % (ref 39.0–52.0)
Hemoglobin: 15 g/dL (ref 13.0–17.0)
Immature Granulocytes: 0 %
Lymphocytes Relative: 16 %
Lymphs Abs: 1.3 10*3/uL (ref 0.7–4.0)
MCH: 30 pg (ref 26.0–34.0)
MCHC: 33.6 g/dL (ref 30.0–36.0)
MCV: 89.4 fL (ref 80.0–100.0)
Monocytes Absolute: 0.7 10*3/uL (ref 0.1–1.0)
Monocytes Relative: 8 %
Neutro Abs: 5 10*3/uL (ref 1.7–7.7)
Neutrophils Relative %: 61 %
Platelets: 267 10*3/uL (ref 150–400)
RBC: 5 MIL/uL (ref 4.22–5.81)
RDW: 13.1 % (ref 11.5–15.5)
WBC: 8.3 10*3/uL (ref 4.0–10.5)
nRBC: 0 % (ref 0.0–0.2)

## 2019-05-04 LAB — COMPREHENSIVE METABOLIC PANEL
ALT: 33 U/L (ref 0–44)
AST: 24 U/L (ref 15–41)
Albumin: 4.3 g/dL (ref 3.5–5.0)
Alkaline Phosphatase: 61 U/L (ref 38–126)
Anion gap: 12 (ref 5–15)
BUN: 15 mg/dL (ref 6–20)
CO2: 29 mmol/L (ref 22–32)
Calcium: 9.1 mg/dL (ref 8.9–10.3)
Chloride: 98 mmol/L (ref 98–111)
Creatinine, Ser: 1.81 mg/dL — ABNORMAL HIGH (ref 0.61–1.24)
GFR calc Af Amer: 47 mL/min — ABNORMAL LOW (ref >60)
GFR calc non Af Amer: 40 mL/min — ABNORMAL LOW (ref >60)
Glucose, Bld: 113 mg/dL — ABNORMAL HIGH (ref 70–99)
Potassium: 3.3 mmol/L — ABNORMAL LOW (ref 3.5–5.1)
Sodium: 139 mmol/L (ref 135–145)
Total Bilirubin: 1.4 mg/dL — ABNORMAL HIGH (ref 0.3–1.2)
Total Protein: 8 g/dL (ref 6.5–8.1)

## 2019-05-04 LAB — MAGNESIUM: Magnesium: 1.9 mg/dL (ref 1.7–2.4)

## 2019-05-04 LAB — BRAIN NATRIURETIC PEPTIDE: B Natriuretic Peptide: 34 pg/mL (ref 0.0–100.0)

## 2019-05-04 LAB — SARS CORONAVIRUS 2 (TAT 6-24 HRS): SARS Coronavirus 2: NEGATIVE

## 2019-05-04 LAB — BLOOD GAS, VENOUS
Acid-Base Excess: 7.4 mmol/L — ABNORMAL HIGH (ref 0.0–2.0)
Bicarbonate: 29.5 mmol/L — ABNORMAL HIGH (ref 20.0–28.0)
FIO2: 21
O2 Saturation: 63 %
Patient temperature: 37.1
pCO2, Ven: 48.7 mmHg (ref 44.0–60.0)
pH, Ven: 7.431 — ABNORMAL HIGH (ref 7.250–7.430)
pO2, Ven: 33.8 mmHg (ref 32.0–45.0)

## 2019-05-04 LAB — TROPONIN I (HIGH SENSITIVITY)
Troponin I (High Sensitivity): 3 ng/L (ref <18)
Troponin I (High Sensitivity): 6 ng/L (ref <18)

## 2019-05-04 MED ORDER — SODIUM CHLORIDE 0.9% FLUSH: 3.0000 mL | INTRAVENOUS | Status: DC | PRN

## 2019-05-04 MED ORDER — SODIUM CHLORIDE 0.9 % IV SOLN
500.0000 mg | INTRAVENOUS | Status: DC
  Administered 2019-05-04 – 2019-05-07 (×4): 500 mg via INTRAVENOUS
  Filled 2019-05-04 (×4): qty 500

## 2019-05-04 MED ORDER — METHYLPREDNISOLONE SODIUM SUCC 125 MG IJ SOLR
125.0000 mg | Freq: Once | INTRAMUSCULAR | Status: AC
  Administered 2019-05-04: 125 mg via INTRAVENOUS
  Filled 2019-05-04: qty 2

## 2019-05-04 MED ORDER — METOPROLOL TARTRATE 25 MG PO TABS
25.0000 mg | ORAL_TABLET | Freq: Two times a day (BID) | ORAL | Status: DC
  Administered 2019-05-04 – 2019-05-08 (×8): 25 mg via ORAL
  Filled 2019-05-04 (×8): qty 1

## 2019-05-04 MED ORDER — ENOXAPARIN SODIUM 80 MG/0.8ML ~~LOC~~ SOLN
65.0000 mg | SUBCUTANEOUS | Status: DC
  Filled 2019-05-04 (×3): qty 0.8

## 2019-05-04 MED ORDER — ONDANSETRON HCL 4 MG PO TABS: 4.0000 mg | ORAL_TABLET | Freq: Four times a day (QID) | ORAL | Status: DC | PRN

## 2019-05-04 MED ORDER — ALBUTEROL (5 MG/ML) CONTINUOUS INHALATION SOLN
INHALATION_SOLUTION | RESPIRATORY_TRACT | Status: AC
  Filled 2019-05-04: qty 20

## 2019-05-04 MED ORDER — IPRATROPIUM-ALBUTEROL 0.5-2.5 (3) MG/3ML IN SOLN
3.0000 mL | Freq: Four times a day (QID) | RESPIRATORY_TRACT | Status: DC
  Administered 2019-05-04 – 2019-05-05 (×2): 3 mL via RESPIRATORY_TRACT
  Filled 2019-05-04 (×2): qty 3

## 2019-05-04 MED ORDER — AMLODIPINE BESYLATE 5 MG PO TABS
5.0000 mg | ORAL_TABLET | Freq: Every day | ORAL | Status: DC
  Administered 2019-05-05 – 2019-05-08 (×4): 5 mg via ORAL
  Filled 2019-05-04 (×4): qty 1

## 2019-05-04 MED ORDER — POLYETHYLENE GLYCOL 3350 17 G PO PACK: 17.0000 g | PACK | Freq: Every day | ORAL | Status: DC | PRN

## 2019-05-04 MED ORDER — ALBUTEROL SULFATE HFA 108 (90 BASE) MCG/ACT IN AERS: 2.0000 | INHALATION_SPRAY | RESPIRATORY_TRACT | Status: DC | PRN

## 2019-05-04 MED ORDER — ACETAMINOPHEN 650 MG RE SUPP: 650.0000 mg | Freq: Four times a day (QID) | RECTAL | Status: DC | PRN

## 2019-05-04 MED ORDER — ACETAMINOPHEN 325 MG PO TABS
650.0000 mg | ORAL_TABLET | Freq: Four times a day (QID) | ORAL | Status: DC | PRN
  Administered 2019-05-05: 650 mg via ORAL
  Filled 2019-05-04: qty 2

## 2019-05-04 MED ORDER — METHYLPREDNISOLONE SODIUM SUCC 125 MG IJ SOLR
60.0000 mg | Freq: Two times a day (BID) | INTRAMUSCULAR | Status: DC
  Administered 2019-05-05 – 2019-05-07 (×5): 60 mg via INTRAVENOUS
  Filled 2019-05-04 (×5): qty 2

## 2019-05-04 MED ORDER — GUAIFENESIN-DM 100-10 MG/5ML PO SYRP
10.0000 mL | ORAL_SOLUTION | Freq: Three times a day (TID) | ORAL | Status: AC
  Administered 2019-05-04 – 2019-05-06 (×5): 10 mL via ORAL
  Filled 2019-05-04 (×5): qty 10

## 2019-05-04 MED ORDER — BUDESONIDE 180 MCG/ACT IN AEPB: 2.0000 | INHALATION_SPRAY | Freq: Two times a day (BID) | RESPIRATORY_TRACT | Status: DC

## 2019-05-04 MED ORDER — POTASSIUM CHLORIDE CRYS ER 20 MEQ PO TBCR
40.0000 meq | EXTENDED_RELEASE_TABLET | Freq: Once | ORAL | Status: AC
  Administered 2019-05-04: 40 meq via ORAL
  Filled 2019-05-04: qty 2

## 2019-05-04 MED ORDER — ONDANSETRON HCL 4 MG/2ML IJ SOLN: 4.0000 mg | Freq: Four times a day (QID) | INTRAMUSCULAR | Status: DC | PRN

## 2019-05-04 MED ORDER — IPRATROPIUM BROMIDE HFA 17 MCG/ACT IN AERS: 2.0000 | INHALATION_SPRAY | Freq: Once | RESPIRATORY_TRACT | Status: DC

## 2019-05-04 MED ORDER — TORSEMIDE 20 MG PO TABS
20.0000 mg | ORAL_TABLET | Freq: Two times a day (BID) | ORAL | Status: DC
  Administered 2019-05-04 – 2019-05-05 (×2): 20 mg via ORAL
  Filled 2019-05-04 (×2): qty 1

## 2019-05-04 MED ORDER — ALBUTEROL SULFATE HFA 108 (90 BASE) MCG/ACT IN AERS: 6.0000 | INHALATION_SPRAY | Freq: Once | RESPIRATORY_TRACT | Status: DC

## 2019-05-04 MED ORDER — MONTELUKAST SODIUM 10 MG PO TABS
10.0000 mg | ORAL_TABLET | Freq: Every day | ORAL | Status: DC
  Administered 2019-05-04 – 2019-05-07 (×4): 10 mg via ORAL
  Filled 2019-05-04 (×4): qty 1

## 2019-05-04 MED ORDER — PNEUMOCOCCAL VAC POLYVALENT 25 MCG/0.5ML IJ INJ
0.5000 mL | INJECTION | INTRAMUSCULAR | Status: AC
  Administered 2019-05-08: 0.5 mL via INTRAMUSCULAR
  Filled 2019-05-04 (×2): qty 0.5

## 2019-05-04 MED ORDER — BUDESONIDE 0.25 MG/2ML IN SUSP
0.2500 mg | Freq: Two times a day (BID) | RESPIRATORY_TRACT | Status: DC
  Administered 2019-05-04 – 2019-05-08 (×8): 0.25 mg via RESPIRATORY_TRACT
  Filled 2019-05-04 (×8): qty 2

## 2019-05-04 NOTE — Progress Notes (Signed)
Due to his wt, ok to increase his prophylaxis dose lovenox to 0.5mg /kg/day per Dr. Denton Brick.  Lovenox 65mg  SQ qday  Onnie Boer, PharmD, [place], AAHIVP, CPP Infectious Disease Pharmacist 05/04/2019 7:23 PM

## 2019-05-04 NOTE — ED Provider Notes (Signed)
[facility] EMERGENCY DEPARTMENT Provider Note   CSN: [id number] Arrival date & time: 05/04/19  1051     History Chief Complaint  Patient presents with  . Shortness of Breath    Thomas [name] is a 59 y.o. male.  HPI      Thomas [name] is a 59 y.o. male with past medical history of COPD, HTN, obstructive sleep apnea, GERD, and stage III CKD who presents to the Emergency Department complaining of gradually worsening shortness of breath and orthopnea for 4 days.  He has been using nebulizer treatments at home along with his routine inhalers with no relief.  Symptoms worsen with exertion.  He was hospitalized for COPD exacerbation in July of last year and states his current symptoms feel similar.  He states the only thing that provides relief his leaning forward. He also describes increased swelling of both ankles. He contacted his PCPs office yesterday and was advised to "double up" on his diuretic.  He states this also did not provide relief of his symptoms.  He reports some cough that is mostly nonproductive.  He denies fever, chills, chest pain and known Covid exposures.    Past Medical History:  Diagnosis Date  . Arthritis    "touch in my neck" (26-Aug-2012)  . COPD (chronic obstructive pulmonary disease) ([place])   . GERD (gastroesophageal reflux disease)   . Hypertension   . Kidney stones    "passed on their own" (August 26, 2012)  . Lateral meniscus tear   . Medial meniscus tear   . OSA (obstructive sleep apnea)    "cannot sleep w/mask" (August 26, 2012)  . Prostate cancer ([place])   . Shortness of breath    "can happen at anytime" (Aug 26, 2012)  . Thyroid nodule    bilaterally/notes 2012/08/26    Patient Active Problem List   Diagnosis Date Noted  . Hypoxia   . Leg edema   . CKD (chronic kidney disease), stage III 09/16/2018  . Acute respiratory failure with hypoxia ([place]) 09/16/2018  . Allergic rhinitis 27-Aug-2018  . ARF (acute renal failure) ([place]) 10/17/2015  . Hypertension    . COPD (chronic obstructive pulmonary disease) ([place])   . GERD (gastroesophageal reflux disease)   . Arthritis   . Kidney stones   . Prostate cancer ([place])   . Thyroid nodule   . OSA (obstructive sleep apnea)   . Medial meniscus tear   . Lateral meniscus tear   . Sleep apnea 11/29/2009  . HYPERSOMNIA 09/26/2009  . DYSPNEA 09/26/2009  . PROSTATE CANCER, HX OF 09/26/2009  . HYPERTENSION 09/25/2009    Past Surgical History:  Procedure Laterality Date  . BACK SURGERY  1990s  . KNEE ARTHROSCOPY WITH MEDIAL MENISECTOMY Right 07/13/2013   Procedure: RIGHT KNEE ARTHROSCOPY WITH MEDIAL AND LATERAL MENISCECTOMY;  Surgeon: Lorn Junes, MD;  Location: [place];  Service: Orthopedics;  Laterality: Right;  . LUMBAR DISC SURGERY  1990's   "had 2 ruptured discs" (08-26-12)  . NASAL SINUS SURGERY  01/2009  . ROBOT ASSISTED LAPAROSCOPIC RADICAL PROSTATECTOMY  2006  . THYROIDECTOMY Left 08/26/12   Procedure: LEFT PARTIAL THYROIDECTOMY;  Surgeon: Ascencion Dike, MD;  Location: [place];  Service: ENT;  Laterality: Left;  . THYROIDECTOMY, PARTIAL Left 08/26/2012       Family History  Problem Relation Age of Onset  . Aneurysm Mother   . Heart attack Father     Social History   Tobacco Use  . Smoking status: Former Smoker  Packs/day: 2.50    Years: 15.00    Pack years: 37.50    Types: Cigarettes    Quit date: 03/03/1993    Years since quitting: 26.1  . Smokeless tobacco: Never Used  Substance Use Topics  . Alcohol use: Yes    Alcohol/week: 14.0 standard drinks    Types: 14 Cans of beer per week    Comment: 08/25/2012 "2 beers/day average"  . Drug use: No    Home Medications Prior to Admission medications   Medication Sig Start Date End Date Taking? Authorizing Provider  Albuterol Sulfate 108 (90 Base) MCG/ACT AEPB Inhale 2 puffs into the lungs every 6 (six) hours as needed (sob, wheeze). 09/21/18  Yes Tat, Shanon Brow, MD  amLODipine (NORVASC) 5 MG tablet Take 1 tablet (5  mg total) by mouth daily. 09/21/18  Yes Tat, Shanon Brow, MD  budesonide-formoterol [facility]) 160-4.5 MCG/ACT inhaler Inhale 2 puffs into the lungs 2 (two) times daily. 10/27/18  Yes Collene Gobble, MD  ipratropium-albuterol (DUONEB) 0.5-2.5 (3) MG/3ML SOLN Take 3 mLs by nebulization 4 (four) times daily. 04/28/19  Yes [provider]  metoprolol tartrate (LOPRESSOR) 25 MG tablet Take 1 tablet (25 mg total) by mouth 2 (two) times daily. 09/21/18  Yes Tat, Shanon Brow, MD  montelukast (SINGULAIR) 10 MG tablet Take 10 mg by mouth at bedtime. 04/05/19  Yes [provider]  torsemide (DEMADEX) 20 MG tablet Take 20 mg by mouth daily.   Yes [provider]    Allergies    Patient has no known allergies.  Review of Systems   Review of Systems  Constitutional: Negative for appetite change, chills and fever.  HENT: Negative for congestion, sore throat and trouble swallowing.   Respiratory: Positive for cough, chest tightness, shortness of breath and wheezing.   Cardiovascular: Positive for leg swelling. Negative for chest pain.  Gastrointestinal: Negative for abdominal pain, nausea and vomiting.  Genitourinary: Negative for dysuria.  Musculoskeletal: Negative for arthralgias.  Skin: Negative for rash.  Neurological: Negative for dizziness, weakness and numbness.  Hematological: Negative for adenopathy.    Physical Exam Updated Vital Signs BP 128/85   Pulse 89   Temp 98.9 F (37.2 C)   Resp (!) 21   Ht 5\' 10"  (1.778 m)   Wt 131.5 kg   SpO2 (!) 88%   BMI 41.61 kg/m   Physical Exam Vitals and nursing note reviewed.  Constitutional:      Appearance: He is not toxic-appearing.     Comments: Pt is obese, uncomfortable appearing  HENT:     Head: Normocephalic.     Mouth/Throat:     Mouth: Mucous membranes are moist.  Cardiovascular:     Rate and Rhythm: Normal rate and regular rhythm.     Pulses: Normal pulses.  Pulmonary:     Effort: Respiratory distress present.      Breath sounds: Rhonchi present.     Comments: Mild respiratory distress noted on exam, patient having difficulty speaking in complete sentences.  Rhonchorous lung sounds bilaterally Abdominal:     General: There is no distension.     Palpations: Abdomen is soft.     Tenderness: There is no abdominal tenderness.  Musculoskeletal:     Cervical back: Normal range of motion.     Right lower leg: Edema present.     Left lower leg: Edema present.     Comments: 2+ pitting edema of the bilateral lower extremities.  No excessive warmth or erythema.  No bilateral calf tenderness  or palpable cord  Skin:    General: Skin is warm.     Capillary Refill: Capillary refill takes less than 2 seconds.     Findings: No rash.  Neurological:     General: No focal deficit present.     Mental Status: He is alert.     Sensory: Sensation is intact. No sensory deficit.     Motor: Motor function is intact. No weakness.     Comments: CN II-XII grossly intact.       ED Results / Procedures / Treatments   Labs (all labs ordered are listed, but only abnormal results are displayed) Labs Reviewed  CBC WITH DIFFERENTIAL/PLATELET - Abnormal; Notable for the following components:      Result Value   Eosinophils Absolute 1.2 (*)    All other components within normal limits  COMPREHENSIVE METABOLIC PANEL - Abnormal; Notable for the following components:   Potassium 3.3 (*)    Glucose, Bld 113 (*)    Creatinine, Ser 1.81 (*)    Total Bilirubin 1.4 (*)    GFR calc non Af Amer 40 (*)    GFR calc Af Amer 47 (*)    All other components within normal limits  BLOOD GAS, VENOUS - Abnormal; Notable for the following components:   pH, Ven 7.431 (*)    Bicarbonate 29.5 (*)    Acid-Base Excess 7.4 (*)    All other components within normal limits  SARS CORONAVIRUS 2 (TAT 6-24 HRS)  BRAIN NATRIURETIC PEPTIDE  TROPONIN I (HIGH SENSITIVITY)  TROPONIN I (HIGH SENSITIVITY)    EKG EKG  Interpretation  Date/Time:  Wednesday May 04 2019 11:30:04 EST Ventricular Rate:  96 PR Interval:    QRS Duration: 102 QT Interval:  362 QTC Calculation: 458 R Axis:   65 Text Interpretation: Sinus rhythm Nonspecific T abnormalities, lateral leads Confirmed by Gerlene Fee [id number]) on 05/04/2019 2:52:24 PM   Radiology DG Chest Port 1 View  Result Date: 05/04/2019 CLINICAL DATA:  Worsening shortness of breath over the past 3-4 days. EXAM: PORTABLE CHEST 1 VIEW COMPARISON:  09/16/2018 FINDINGS: The cardiac silhouette, mediastinal and hilar contours are normal and stable. The lungs are clear. No pleural effusions. No pulmonary lesions. The bony thorax is intact. IMPRESSION: No acute cardiopulmonary findings. Electronically Signed   By: Marijo Sanes M.D.   On: 05/04/2019 12:44    Procedures Procedures (including critical care time)  Medications Ordered in ED Medications  albuterol (VENTOLIN HFA) 108 (90 Base) MCG/ACT inhaler 2 puff (has no administration in time range)  albuterol (VENTOLIN) (5 MG/ML) 0.5% continuous inhalation solution (  Given 05/04/19 1231)  potassium chloride SA (KLOR-CON) CR tablet 40 mEq (40 mEq Oral Given 05/04/19 1422)    ED Course  I have reviewed the triage vital signs and the nursing notes.  Pertinent labs & imaging results that were available during my care of the patient were reviewed by me and considered in my medical decision making (see chart for details).    MDM Rules/Calculators/A&P                     Patient with likely recurrent COPD exacerbation.  Breathing mildly labored on initial exam with rhonchorous lung sounds and some expiratory wheezing.  Will obtain labs, chest x-ray, and albuterol ordered with Covid testing pending.   1430  On recheck, patient has received continuous albuterol treatment. His O2 saturation is 91% on RA.  Tachycardic after the albuterol.  Continues to have  labored respirations now with diminished lungs sounds and expiratory  wheezing. dyspnea on exertion to the restroom.     Pt also seen by Dr. Sedonia Small and care plan discussed.  Pt will likely need admission for COPD exacerbation.    [place] hospitalist, who agrees to admit.     Final Clinical Impression(s) / ED Diagnoses Final diagnoses:  COPD exacerbation [facility])    Rx / DC Orders ED Discharge Orders    None       Kem Parkinson, PA-C 05/04/19 1610    Maudie Flakes, MD 05/05/19 [phone]

## 2019-05-04 NOTE — H&P (Addendum)
History and Physical    Thomas [name] [id number] DOB: January 26, 1961 DOA: 05/04/2019  PCP: Asencion Noble, MD   Patient coming from: Home  I have personally briefly reviewed patient's old medical records in [place]  Chief Complaint: SOB  HPI: Thomas [name] is a 60 y.o. male with medical history significant for COPD, prostate cancer, hypertension, CKD 3.  Patient presented to the ED with complaints of increasing difficulty breathing over the past 3 to 4 days, symptoms did not improve despite using home inhalers and bronchodilators.  He reports productive cough.  He denies chest pain.  He reports chronic lower extremity swelling that is unchanged.  ED Course: O2 sats down to 88% on room air, improved with 2 L O2.  Temperature 98.9.  Potassium 3.3.  Chest x-ray BNP unremarkable.  Solu-Medrol and albuterol inhaler given, but with persistent rhonchi and wheezing in the ED, hospitalist to admit for COPD exacerbation.  Review of Systems: As per HPI all other systems reviewed and negative.  Past Medical History:  Diagnosis Date  . Arthritis    "touch in my neck" (09/23/2012)  . COPD (chronic obstructive pulmonary disease) ([place])   . GERD (gastroesophageal reflux disease)   . Hypertension   . Kidney stones    "passed on their own" (09-23-12)  . Lateral meniscus tear   . Medial meniscus tear   . OSA (obstructive sleep apnea)    "cannot sleep w/mask" (23-Sep-2012)  . Prostate cancer ([place])   . Shortness of breath    "can happen at anytime" (09/23/2012)  . Thyroid nodule    bilaterally/notes 23-Sep-2012    Past Surgical History:  Procedure Laterality Date  . BACK SURGERY  1990s  . KNEE ARTHROSCOPY WITH MEDIAL MENISECTOMY Right 07/13/2013   Procedure: RIGHT KNEE ARTHROSCOPY WITH MEDIAL AND LATERAL MENISCECTOMY;  Surgeon: Lorn Junes, MD;  Location: [facility];  Service: Orthopedics;  Laterality: Right;  . LUMBAR DISC SURGERY  1990's   "had 2 ruptured discs"  (September 23, 2012)  . NASAL SINUS SURGERY  01/2009  . ROBOT ASSISTED LAPAROSCOPIC RADICAL PROSTATECTOMY  2006  . THYROIDECTOMY Left Sep 23, 2012   Procedure: LEFT PARTIAL THYROIDECTOMY;  Surgeon: Ascencion Dike, MD;  Location: [facility];  Service: ENT;  Laterality: Left;  . THYROIDECTOMY, PARTIAL Left 09/23/12     reports that he quit smoking about 26 years ago. His smoking use included cigarettes. He has a 37.50 pack-year smoking history. He has never used smokeless tobacco. He reports current alcohol use of about 14.0 standard drinks of alcohol per week. He reports that he does not use drugs.  No Known Allergies  Family History  Problem Relation Age of Onset  . Aneurysm Mother   . Heart attack Father     Prior to Admission medications   Medication Sig Start Date End Date Taking? Authorizing Provider  Albuterol Sulfate 108 (90 Base) MCG/ACT AEPB Inhale 2 puffs into the lungs every 6 (six) hours as needed (sob, wheeze). 09/21/18  Yes Tat, Shanon Brow, MD  amLODipine (NORVASC) 5 MG tablet Take 1 tablet (5 mg total) by mouth daily. 09/21/18  Yes Tat, Shanon Brow, MD  budesonide-formoterol [facility]) 160-4.5 MCG/ACT inhaler Inhale 2 puffs into the lungs 2 (two) times daily. 10/27/18  Yes Collene Gobble, MD  ipratropium-albuterol (DUONEB) 0.5-2.5 (3) MG/3ML SOLN Take 3 mLs by nebulization 4 (four) times daily. 04/28/19  Yes [provider]  metoprolol tartrate (LOPRESSOR) 25 MG tablet Take 1 tablet (25 mg total) by mouth 2 (  two) times daily. 09/21/18  Yes Tat, Shanon Brow, MD  montelukast (SINGULAIR) 10 MG tablet Take 10 mg by mouth at bedtime. 04/05/19  Yes [provider]  torsemide (DEMADEX) 20 MG tablet Take 20 mg by mouth daily.   Yes [provider]    Physical Exam: Vitals:   05/04/19 1200 05/04/19 1230 05/04/19 1232 05/04/19 1451  BP: 115/80 128/85  118/76  Pulse:  89  (!) 121  Resp: 13 (!) 21  (!) 24  Temp:    98.7 F (37.1 C)  TempSrc:    Oral  SpO2:  92% (!) 88% 92%  Weight:       Height:        Constitutional: NAD, calm, comfortable Vitals:   05/04/19 1200 05/04/19 1230 05/04/19 1232 05/04/19 1451  BP: 115/80 128/85  118/76  Pulse:  89  (!) 121  Resp: 13 (!) 21  (!) 24  Temp:    98.7 F (37.1 C)  TempSrc:    Oral  SpO2:  92% (!) 88% 92%  Weight:      Height:       Eyes: PERRL, lids and conjunctivae normal ENMT: Mucous membranes are moist.  Neck: normal, supple, no masses, no thyromegaly Respiratory: Diffuse inspiratory and expiratory rhonchi with expiratory wheezing, Normal respiratory effort. No accessory muscle use.  Cardiovascular: Regular rate and rhythm, no murmurs / rubs / gallops. Chronic 2 + pitting extremity edema to mid leg. 2+ pedal pulses.   Abdomen: Full, no tenderness, no masses palpated. No hepatosplenomegaly. Bowel sounds positive.  Musculoskeletal: no clubbing / cyanosis. No joint deformity upper and lower extremities. Good ROM, no contractures. Normal muscle tone.  Skin: no rashes, lesions, ulcers. No induration Neurologic: No apparent cranial abnormalities, moving all extremities spontaneously Psychiatric: Normal judgment and insight. Alert and oriented x 3. Normal mood.   Labs on Admission: I have personally reviewed following labs and imaging studies  CBC: Recent Labs  Lab 05/04/19 1130  WBC 8.3  NEUTROABS 5.0  HGB 15.0  HCT 44.7  MCV 89.4  PLT 99991111   Basic Metabolic Panel: Recent Labs  Lab 05/04/19 1130  NA 139  K 3.3*  CL 98  CO2 29  GLUCOSE 113*  BUN 15  CREATININE 1.81*  CALCIUM 9.1   Liver Function Tests: Recent Labs  Lab 05/04/19 1130  AST 24  ALT 33  ALKPHOS 61  BILITOT 1.4*  PROT 8.0  ALBUMIN 4.3    Radiological Exams on Admission: DG Chest Port 1 View  Result Date: 05/04/2019 CLINICAL DATA:  Worsening shortness of breath over the past 3-4 days. EXAM: PORTABLE CHEST 1 VIEW COMPARISON:  09/16/2018 FINDINGS: The cardiac silhouette, mediastinal and hilar contours are normal and stable. The lungs are  clear. No pleural effusions. No pulmonary lesions. The bony thorax is intact. IMPRESSION: No acute cardiopulmonary findings. Electronically Signed   By: Marijo Sanes M.D.   On: 05/04/2019 12:44    EKG: Independently reviewed.  Sinus rhythm, QTC 458.  Old and unchanged nonspecific abnormalities to lead I and aVL.  Assessment/Plan Active Problems:   COPD exacerbation (HCC)   Acute hypoxic respiratory failure secondary to COPD exacerbation-dyspnea, rhonchi and wheezing.  O2 sats 88% on room air, on 2 L nasal cannula.  Chest x-ray negative for acute disease.  WBC 8.3.  Afebrile.  Similar prior presentation.  Chronic unchanged lower extremity swelling.  BNP stable 34. -IV Solu-Medrol 125 given, continue 60 every 12 hourly -Supplemental O2 -Mucolytic scheduled - PRN and  scheduled duo nebs -IV azithromycin 500 mg daily -Follow-up COVID-19 test -Resume home steroid inhalers, montelukast  Mild hypokalemia-potassium 3.3, likely from diuretics. -Check magnesium -Replete potassium -BMP a.m.  Hypertension-stable. -Resume home Norvasc 5, metoprolol 25mg  BID, torsemide - reports he takes 20mg  Q12hr- resumed  OSA-  he does not use his CPAP at night- cant tolerate it.  CKD 3-creatinine 1.8, at baseline -Resume home torsemide 20 mg daily  History of prostate cancer status post robotic assisted laparoscopic radical prostatectomy 2006.   DVT prophylaxis: Lovenox Code Status: Full code Family Communication: None at bedside Disposition Plan: 1 to 2 days Consults called: None Admission status: Observation, telemetry.   Bethena Roys MD [facility]  05/04/2019, 4:31 PM

## 2019-05-04 NOTE — Plan of Care (Signed)

## 2019-05-04 NOTE — Progress Notes (Signed)
Patient new admit to [address]. Vital signs obtained. Mews score 3, Yellow. Heart rate 122. MD notified. Instructed to give scheduled Metoprolol 25 mg PO now.

## 2019-05-04 NOTE — ED Triage Notes (Signed)
PT c/o worsening with SOB over the past 3-4 days and little to no relief from home inhalers and nebs. PT states he was last hospitalization for COPD exacerbation July 2020 and feels the same as he did then. PT denies any fever or covid exposure.

## 2019-05-05 DIAGNOSIS — N183 Chronic kidney disease, stage 3 unspecified: Secondary | ICD-10-CM | POA: Diagnosis not present

## 2019-05-05 DIAGNOSIS — Z87442 Personal history of urinary calculi: Secondary | ICD-10-CM | POA: Diagnosis not present

## 2019-05-05 DIAGNOSIS — K219 Gastro-esophageal reflux disease without esophagitis: Secondary | ICD-10-CM | POA: Diagnosis present

## 2019-05-05 DIAGNOSIS — M1909 Primary osteoarthritis, other specified site: Secondary | ICD-10-CM | POA: Diagnosis present

## 2019-05-05 DIAGNOSIS — G4733 Obstructive sleep apnea (adult) (pediatric): Secondary | ICD-10-CM | POA: Diagnosis present

## 2019-05-05 DIAGNOSIS — J441 Chronic obstructive pulmonary disease with (acute) exacerbation: Secondary | ICD-10-CM | POA: Diagnosis present

## 2019-05-05 DIAGNOSIS — J9601 Acute respiratory failure with hypoxia: Secondary | ICD-10-CM | POA: Diagnosis present

## 2019-05-05 DIAGNOSIS — Z8249 Family history of ischemic heart disease and other diseases of the circulatory system: Secondary | ICD-10-CM | POA: Diagnosis not present

## 2019-05-05 DIAGNOSIS — M7989 Other specified soft tissue disorders: Secondary | ICD-10-CM | POA: Diagnosis present

## 2019-05-05 DIAGNOSIS — R0602 Shortness of breath: Secondary | ICD-10-CM | POA: Diagnosis present

## 2019-05-05 DIAGNOSIS — E876 Hypokalemia: Secondary | ICD-10-CM | POA: Diagnosis present

## 2019-05-05 DIAGNOSIS — Z23 Encounter for immunization: Secondary | ICD-10-CM | POA: Diagnosis not present

## 2019-05-05 DIAGNOSIS — I10 Essential (primary) hypertension: Secondary | ICD-10-CM

## 2019-05-05 DIAGNOSIS — N1832 Chronic kidney disease, stage 3b: Secondary | ICD-10-CM | POA: Diagnosis present

## 2019-05-05 DIAGNOSIS — Z79899 Other long term (current) drug therapy: Secondary | ICD-10-CM | POA: Diagnosis not present

## 2019-05-05 DIAGNOSIS — E66813 Obesity, class 3: Secondary | ICD-10-CM

## 2019-05-05 DIAGNOSIS — Z7951 Long term (current) use of inhaled steroids: Secondary | ICD-10-CM | POA: Diagnosis not present

## 2019-05-05 DIAGNOSIS — Z9079 Acquired absence of other genital organ(s): Secondary | ICD-10-CM | POA: Diagnosis not present

## 2019-05-05 DIAGNOSIS — Z20822 Contact with and (suspected) exposure to covid-19: Secondary | ICD-10-CM | POA: Diagnosis present

## 2019-05-05 DIAGNOSIS — Z8546 Personal history of malignant neoplasm of prostate: Secondary | ICD-10-CM | POA: Diagnosis not present

## 2019-05-05 DIAGNOSIS — Z6841 Body Mass Index (BMI) 40.0 and over, adult: Secondary | ICD-10-CM | POA: Diagnosis not present

## 2019-05-05 DIAGNOSIS — E890 Postprocedural hypothyroidism: Secondary | ICD-10-CM | POA: Diagnosis present

## 2019-05-05 DIAGNOSIS — Z87891 Personal history of nicotine dependence: Secondary | ICD-10-CM | POA: Diagnosis not present

## 2019-05-05 DIAGNOSIS — I129 Hypertensive chronic kidney disease with stage 1 through stage 4 chronic kidney disease, or unspecified chronic kidney disease: Secondary | ICD-10-CM | POA: Diagnosis present

## 2019-05-05 HISTORY — DX: Morbid (severe) obesity due to excess calories: E66.01

## 2019-05-05 HISTORY — DX: Obesity, class 3: E66.813

## 2019-05-05 LAB — BASIC METABOLIC PANEL
Anion gap: 13 (ref 5–15)
BUN: 23 mg/dL — ABNORMAL HIGH (ref 6–20)
CO2: 27 mmol/L (ref 22–32)
Calcium: 9.5 mg/dL (ref 8.9–10.3)
Chloride: 99 mmol/L (ref 98–111)
Creatinine, Ser: 2.33 mg/dL — ABNORMAL HIGH (ref 0.61–1.24)
GFR calc Af Amer: 34 mL/min — ABNORMAL LOW (ref >60)
GFR calc non Af Amer: 30 mL/min — ABNORMAL LOW (ref >60)
Glucose, Bld: 143 mg/dL — ABNORMAL HIGH (ref 70–99)
Potassium: 3.7 mmol/L (ref 3.5–5.1)
Sodium: 139 mmol/L (ref 135–145)

## 2019-05-05 MED ORDER — ALBUTEROL SULFATE (2.5 MG/3ML) 0.083% IN NEBU
2.5000 mg | INHALATION_SOLUTION | RESPIRATORY_TRACT | Status: DC | PRN
  Administered 2019-05-06 – 2019-05-07 (×2): 2.5 mg via RESPIRATORY_TRACT
  Filled 2019-05-05 (×2): qty 3

## 2019-05-05 MED ORDER — IPRATROPIUM-ALBUTEROL 0.5-2.5 (3) MG/3ML IN SOLN
3.0000 mL | RESPIRATORY_TRACT | Status: DC
  Administered 2019-05-05 – 2019-05-08 (×20): 3 mL via RESPIRATORY_TRACT
  Filled 2019-05-05 (×21): qty 3

## 2019-05-05 NOTE — Progress Notes (Signed)
PROGRESS NOTE    Thomas [name]  [id number] DOB: 1960/05/09 DOA: 05/04/2019 PCP: Asencion Noble, MD    Brief Narrative:  59 year old male with a history of hypertension, chronic kidney disease, prostate cancer and COPD, admitted to the hospital with shortness of breath secondary to COPD exacerbation.  Admitted for bronchodilators and steroid therapy.   Assessment & Plan:   Active Problems:   Essential hypertension   PROSTATE CANCER, HX OF   CKD (chronic kidney disease), stage III   Acute respiratory failure with hypoxia (HCC)   COPD exacerbation ([place])   1. Acute hypoxic respiratory failure secondary to COPD exacerbation.  Patient continues to have significant shortness of breath, cough and wheezing.  Continue on intravenous steroids, inhaled steroids, bronchodilators and antibiotics.  Will provide flutter valve. 2. Hypokalemia.  Replace.  Magnesium normal 3. Hypertension.  Stable on metoprolol and Norvasc. 4. Obstructive sleep apnea.  Reports that he is intolerant of CPAP. 5. Chronic kidney disease stage III.  Baseline creatinine of 1.8.  This has bumped since admission.  Will hold further torsemide for now. 6. History of prostate cancer.  Status post prostatectomy in the past.   DVT prophylaxis: Lovenox Code Status: Full code Family Communication: Discussed with patient Disposition Plan: Discharge home when wheezing has resolved and patient is able to wean off of oxygen.  Anticipate discharge in the next 24-48 hours.   Consultants:     Procedures:     Antimicrobials:   Azithromycin 3/3 >   Subjective: Still short of breath, cough and wheezing.  Does not feel that respiratory status is back to baseline yet.  Objective: Vitals:   05/05/19 0431 05/05/19 0801 05/05/19 1200 05/05/19 1352  BP: 129/76   132/70  Pulse: (!) 103   (!) 104  Resp: 20   20  Temp: 98.4 F (36.9 C)   97.6 F (36.4 C)  TempSrc: Oral   Oral  SpO2: 96% 97% 96% 97%  Weight:      Height:         Intake/Output Summary (Last 24 hours) at 05/05/2019 1852 Last data filed at 05/05/2019 1700 Gross per 24 hour  Intake 970 ml  Output 1400 ml  Net -430 ml   Filed Weights   05/04/19 1120 05/04/19 1955  Weight: 131.5 kg 130.9 kg    Examination:  General exam: Appears calm and comfortable  Respiratory system: Bilateral rhonchi and wheezing. Respiratory effort normal. Cardiovascular system: S1 & S2 heard, RRR. No JVD, murmurs, rubs, gallops or clicks. 1+ pedal edema. Gastrointestinal system: Abdomen is nondistended, soft and nontender. No organomegaly or masses felt. Normal bowel sounds heard. Central nervous system: Alert and oriented. No focal neurological deficits. Extremities: Symmetric 5 x 5 power. Skin: No rashes, lesions or ulcers Psychiatry: Judgement and insight appear normal. Mood & affect appropriate.     Data Reviewed: I have personally reviewed following labs and imaging studies  CBC: Recent Labs  Lab 05/04/19 1130  WBC 8.3  NEUTROABS 5.0  HGB 15.0  HCT 44.7  MCV 89.4  PLT 99991111   Basic Metabolic Panel: Recent Labs  Lab 05/04/19 1130 05/04/19 1537 05/05/19 0345  NA 139  --  139  K 3.3*  --  3.7  CL 98  --  99  CO2 29  --  27  GLUCOSE 113*  --  143*  BUN 15  --  23*  CREATININE 1.81*  --  2.33*  CALCIUM 9.1  --  9.5  MG  --  1.9  --    GFR: Estimated Creatinine Clearance: 47 mL/min (A) (by C-G formula based on SCr of 2.33 mg/dL (H)). Liver Function Tests: Recent Labs  Lab 05/04/19 1130  AST 24  ALT 33  ALKPHOS 61  BILITOT 1.4*  PROT 8.0  ALBUMIN 4.3   No results for input(s): LIPASE, AMYLASE in the last 168 hours. No results for input(s): AMMONIA in the last 168 hours. Coagulation Profile: No results for input(s): INR, PROTIME in the last 168 hours. Cardiac Enzymes: No results for input(s): CKTOTAL, CKMB, CKMBINDEX, TROPONINI in the last 168 hours. BNP (last 3 results) No results for input(s): PROBNP in the last 8760  hours. HbA1C: No results for input(s): HGBA1C in the last 72 hours. CBG: No results for input(s): GLUCAP in the last 168 hours. Lipid Profile: No results for input(s): CHOL, HDL, LDLCALC, TRIG, CHOLHDL, LDLDIRECT in the last 72 hours. Thyroid Function Tests: No results for input(s): TSH, T4TOTAL, FREET4, T3FREE, THYROIDAB in the last 72 hours. Anemia Panel: No results for input(s): VITAMINB12, FOLATE, FERRITIN, TIBC, IRON, RETICCTPCT in the last 72 hours. Sepsis Labs: No results for input(s): PROCALCITON, LATICACIDVEN in the last 168 hours.  Recent Results (from the past 240 hour(s))  SARS CORONAVIRUS 2 (TAT 6-24 HRS) Nasopharyngeal Nasopharyngeal Swab     Status: None   Collection Time: 05/04/19 12:16 PM   Specimen: Nasopharyngeal Swab  Result Value Ref Range Status   SARS Coronavirus 2 NEGATIVE NEGATIVE Final    Comment: (NOTE) SARS-CoV-2 target nucleic acids are NOT DETECTED. The SARS-CoV-2 RNA is generally detectable in upper and lower respiratory specimens during the acute phase of infection. Negative results do not preclude SARS-CoV-2 infection, do not rule out co-infections with other pathogens, and should not be used as the sole basis for treatment or other patient management decisions. Negative results must be combined with clinical observations, patient history, and epidemiological information. The expected result is Negative. Fact Sheet for Patients: SugarRoll.be Fact Sheet for Healthcare Providers: https://www.woods-mathews.com/ This test is not yet approved or cleared by the [place] FDA and  has been authorized for detection and/or diagnosis of SARS-CoV-2 by FDA under an Emergency Use Authorization (EUA). This EUA will remain  in effect (meaning this test can be used) for the duration of the COVID-19 declaration under Section 56 4(b)(1) of the Act, 21 U.S.C. section 360bbb-3(b)(1), unless the authorization is terminated  or revoked sooner. Performed at [facility], [place] [address] [place], [place] [address]          Radiology Studies: DG Chest Port 1 View  Result Date: 05/04/2019 CLINICAL DATA:  Worsening shortness of breath over the past 3-4 days. EXAM: PORTABLE CHEST 1 VIEW COMPARISON:  09/16/2018 FINDINGS: The cardiac silhouette, mediastinal and hilar contours are normal and stable. The lungs are clear. No pleural effusions. No pulmonary lesions. The bony thorax is intact. IMPRESSION: No acute cardiopulmonary findings. Electronically Signed   By: Marijo Sanes M.D.   On: 05/04/2019 12:44        Scheduled Meds: . amLODipine  5 mg Oral Daily  . budesonide  0.25 mg Nebulization BID  . enoxaparin (LOVENOX) injection  65 mg Subcutaneous Q24H  . guaiFENesin-dextromethorphan  10 mL Oral Q8H  . ipratropium-albuterol  3 mL Nebulization Q4H  . methylPREDNISolone (SOLU-MEDROL) injection  60 mg Intravenous Q12H  . metoprolol tartrate  25 mg Oral BID  . montelukast  10 mg Oral QHS  . pneumococcal 23 valent vaccine  0.5 mL Intramuscular  Tomorrow-1000   Continuous Infusions: . azithromycin 500 mg (05/05/19 1637)     LOS: 0 days    Time spent: 44mins    Kathie Dike, MD [facility]   If 7PM-7AM, please contact night-coverage [email or web address]  05/05/2019, 6:52 PM

## 2019-05-06 LAB — BASIC METABOLIC PANEL
Anion gap: 13 (ref 5–15)
BUN: 34 mg/dL — ABNORMAL HIGH (ref 6–20)
CO2: 26 mmol/L (ref 22–32)
Calcium: 9.5 mg/dL (ref 8.9–10.3)
Chloride: 99 mmol/L (ref 98–111)
Creatinine, Ser: 2.01 mg/dL — ABNORMAL HIGH (ref 0.61–1.24)
GFR calc Af Amer: 41 mL/min — ABNORMAL LOW (ref >60)
GFR calc non Af Amer: 36 mL/min — ABNORMAL LOW (ref >60)
Glucose, Bld: 143 mg/dL — ABNORMAL HIGH (ref 70–99)
Potassium: 3.8 mmol/L (ref 3.5–5.1)
Sodium: 138 mmol/L (ref 135–145)

## 2019-05-06 MED ORDER — GUAIFENESIN ER 600 MG PO TB12
1200.0000 mg | ORAL_TABLET | Freq: Two times a day (BID) | ORAL | Status: DC
  Administered 2019-05-06 – 2019-05-08 (×4): 1200 mg via ORAL
  Filled 2019-05-06 (×4): qty 2

## 2019-05-06 NOTE — Progress Notes (Addendum)
PROGRESS NOTE    Thomas [name]  [id number] DOB: June 17, 1960 DOA: 05/04/2019 PCP: Asencion Noble, MD    Brief Narrative:  59 year old male with a history of hypertension, chronic kidney disease, prostate cancer and COPD, admitted to the hospital with shortness of breath secondary to COPD exacerbation.  Admitted for bronchodilators and steroid therapy.   Assessment & Plan:   Active Problems:   Essential hypertension   PROSTATE CANCER, HX OF   OSA (obstructive sleep apnea)   CKD (chronic kidney disease), stage III   Acute respiratory failure with hypoxia (HCC)   COPD exacerbation (HCC)   Obesity, Class III, BMI 40-49.9 (morbid obesity) ([place])   Acute hypoxic respiratory failure secondary to COPD exacerbation.  Patient continues to have significant shortness of breath, cough and wheezing.  Continue on intravenous steroids, inhaled steroids, bronchodilators and antibiotics.  Continue flutter valve. Hypokalemia.  Replace.  Magnesium normal Hypertension.  Stable on metoprolol and Norvasc. Obstructive sleep apnea.  Reports that he is intolerant of CPAP. He may benefit from trying a new mask. Chronic kidney disease stage IIIb.  Baseline creatinine of 1.8.  Resume torsemide tomorrow if creatinine stable History of prostate cancer.  Status post prostatectomy in the past.   DVT prophylaxis: Lovenox Code Status: Full code Family Communication: Discussed with patient Disposition Plan: Discharge home when wheezing has resolved and patient is able to wean off of oxygen.  Anticipate discharge in the next 24-48 hours.   Consultants:    Procedures:    Antimicrobials:  Azithromycin 3/3 >   Subjective: Continues to have coughing and wheezing. Becomes short of breath on exertion. Does not feel back to baseline yet  Objective: Vitals:   05/06/19 1003 05/06/19 1309 05/06/19 1347 05/06/19 1615  BP:   138/75   Pulse:   81   Resp:   19   Temp:   97.7 F (36.5 C)   TempSrc:   Oral     SpO2: 92% 95% 96% 96%  Weight:      Height:        Intake/Output Summary (Last 24 hours) at 05/06/2019 1910 Last data filed at 05/06/2019 1700 Gross per 24 hour  Intake 720 ml  Output 1450 ml  Net -730 ml   Filed Weights   05/04/19 1120 05/04/19 1955  Weight: 131.5 kg 130.9 kg    Examination:  General exam: Alert, awake, oriented x 3 Respiratory system: bilateral rhonchi and wheezing. Respiratory effort normal. Cardiovascular system:RRR. No murmurs, rubs, gallops. Gastrointestinal system: Abdomen is nondistended, soft and nontender. No organomegaly or masses felt. Normal bowel sounds heard. Central nervous system: Alert and oriented. No focal neurological deficits. Extremities: 1+ edema bilaterally Skin: No rashes, lesions or ulcers Psychiatry: Judgement and insight appear normal. Mood & affect appropriate.     Data Reviewed: I have personally reviewed following labs and imaging studies  CBC: Recent Labs  Lab 05/04/19 1130  WBC 8.3  NEUTROABS 5.0  HGB 15.0  HCT 44.7  MCV 89.4  PLT 99991111   Basic Metabolic Panel: Recent Labs  Lab 05/04/19 1130 05/04/19 1537 05/05/19 0345 05/06/19 0357  NA 139  --  139 138  K 3.3*  --  3.7 3.8  CL 98  --  99 99  CO2 29  --  27 26  GLUCOSE 113*  --  143* 143*  BUN 15  --  23* 34*  CREATININE 1.81*  --  2.33* 2.01*  CALCIUM 9.1  --  9.5 9.5  MG  --  1.9  --   --    GFR: Estimated Creatinine Clearance: 54.5 mL/min (A) (by C-G formula based on SCr of 2.01 mg/dL (H)). Liver Function Tests: Recent Labs  Lab 05/04/19 1130  AST 24  ALT 33  ALKPHOS 61  BILITOT 1.4*  PROT 8.0  ALBUMIN 4.3   No results for input(s): LIPASE, AMYLASE in the last 168 hours. No results for input(s): AMMONIA in the last 168 hours. Coagulation Profile: No results for input(s): INR, PROTIME in the last 168 hours. Cardiac Enzymes: No results for input(s): CKTOTAL, CKMB, CKMBINDEX, TROPONINI in the last 168 hours. BNP (last 3 results) No results  for input(s): PROBNP in the last 8760 hours. HbA1C: No results for input(s): HGBA1C in the last 72 hours. CBG: No results for input(s): GLUCAP in the last 168 hours. Lipid Profile: No results for input(s): CHOL, HDL, LDLCALC, TRIG, CHOLHDL, LDLDIRECT in the last 72 hours. Thyroid Function Tests: No results for input(s): TSH, T4TOTAL, FREET4, T3FREE, THYROIDAB in the last 72 hours. Anemia Panel: No results for input(s): VITAMINB12, FOLATE, FERRITIN, TIBC, IRON, RETICCTPCT in the last 72 hours. Sepsis Labs: No results for input(s): PROCALCITON, LATICACIDVEN in the last 168 hours.  Recent Results (from the past 240 hour(s))  SARS CORONAVIRUS 2 (TAT 6-24 HRS) Nasopharyngeal Nasopharyngeal Swab     Status: None   Collection Time: 05/04/19 12:16 PM   Specimen: Nasopharyngeal Swab  Result Value Ref Range Status   SARS Coronavirus 2 NEGATIVE NEGATIVE Final    Comment: (NOTE) SARS-CoV-2 target nucleic acids are NOT DETECTED. The SARS-CoV-2 RNA is generally detectable in upper and lower respiratory specimens during the acute phase of infection. Negative results do not preclude SARS-CoV-2 infection, do not rule out co-infections with other pathogens, and should not be used as the sole basis for treatment or other patient management decisions. Negative results must be combined with clinical observations, patient history, and epidemiological information. The expected result is Negative. Fact Sheet for Patients: SugarRoll.be Fact Sheet for Healthcare Providers: https://www.woods-mathews.com/ This test is not yet approved or cleared by the [place] FDA and  has been authorized for detection and/or diagnosis of SARS-CoV-2 by FDA under an Emergency Use Authorization (EUA). This EUA will remain  in effect (meaning this test can be used) for the duration of the COVID-19 declaration under Section 56 4(b)(1) of the Act, 21 U.S.C. section 360bbb-3(b)(1),  unless the authorization is terminated or revoked sooner. Performed at [facility], [place] [address], [place], [place] [address]          Radiology Studies: No results found.      Scheduled Meds:  amLODipine  5 mg Oral Daily   budesonide  0.25 mg Nebulization BID   enoxaparin (LOVENOX) injection  65 mg Subcutaneous Q24H   guaiFENesin  1,200 mg Oral BID   ipratropium-albuterol  3 mL Nebulization Q4H   methylPREDNISolone (SOLU-MEDROL) injection  60 mg Intravenous Q12H   metoprolol tartrate  25 mg Oral BID   montelukast  10 mg Oral QHS   pneumococcal 23 valent vaccine  0.5 mL Intramuscular Tomorrow-1000   Continuous Infusions:  azithromycin 500 mg (05/06/19 1707)     LOS: 1 day    Time spent: 73mins    Kathie Dike, MD [facility]   If 7PM-7AM, please contact night-coverage [email or web address]  05/06/2019, 7:10 PM

## 2019-05-07 LAB — BASIC METABOLIC PANEL
Anion gap: 13 (ref 5–15)
BUN: 39 mg/dL — ABNORMAL HIGH (ref 6–20)
CO2: 27 mmol/L (ref 22–32)
Calcium: 9.5 mg/dL (ref 8.9–10.3)
Chloride: 98 mmol/L (ref 98–111)
Creatinine, Ser: 1.95 mg/dL — ABNORMAL HIGH (ref 0.61–1.24)
GFR calc Af Amer: 43 mL/min — ABNORMAL LOW (ref >60)
GFR calc non Af Amer: 37 mL/min — ABNORMAL LOW (ref >60)
Glucose, Bld: 165 mg/dL — ABNORMAL HIGH (ref 70–99)
Potassium: 3.7 mmol/L (ref 3.5–5.1)
Sodium: 138 mmol/L (ref 135–145)

## 2019-05-07 MED ORDER — TORSEMIDE 20 MG PO TABS
20.0000 mg | ORAL_TABLET | Freq: Every day | ORAL | Status: DC
  Administered 2019-05-07 – 2019-05-08 (×2): 20 mg via ORAL
  Filled 2019-05-07 (×2): qty 1

## 2019-05-07 MED ORDER — PREDNISONE 20 MG PO TABS
40.0000 mg | ORAL_TABLET | Freq: Every day | ORAL | Status: DC
  Administered 2019-05-08: 40 mg via ORAL
  Filled 2019-05-07: qty 2

## 2019-05-07 NOTE — Progress Notes (Signed)
PROGRESS NOTE    Thomas [name]  [id number] DOB: 02-07-1961 DOA: 05/04/2019 PCP: Asencion Noble, MD    Brief Narrative:  59 year old male with a history of hypertension, chronic kidney disease, prostate cancer and COPD, admitted to the hospital with shortness of breath secondary to COPD exacerbation.  Admitted for bronchodilators and steroid therapy.   Assessment & Plan:   Active Problems:   Essential hypertension   PROSTATE CANCER, HX OF   OSA (obstructive sleep apnea)   CKD (chronic kidney disease), stage III   Acute respiratory failure with hypoxia (HCC)   COPD exacerbation (HCC)   Obesity, Class III, BMI 40-49.9 (morbid obesity) ([place])   1. Acute hypoxic respiratory failure secondary to COPD exacerbation.  Shortness of breath is continuing to improve. Wheezing also improving. Still becomes very dyspneic on ambulation and having trouble walking around room. Will continue with current treatments for another 24 hours. Since wheezing is better, will change solumedrol to prednisone taper. 2. Hypokalemia.  Replaced.  Magnesium normal 3. Hypertension.  Stable on metoprolol and Norvasc. 4. Obstructive sleep apnea.  Reports that he is intolerant of CPAP. He may benefit from trying a new mask. 5. Chronic kidney disease stage IIIb.  Baseline creatinine of 1.8.  Resume torsemide today 6. History of prostate cancer.  Status post prostatectomy in the past.   DVT prophylaxis: Lovenox Code Status: Full code Family Communication: Discussed with patient Disposition Plan: Discharge home when wheezing has resolved and patient is able to wean off of oxygen.  Anticipate discharge in the next 24 hours.   Consultants:     Procedures:     Antimicrobials:   Azithromycin 3/3 >   Subjective: Continues to feel as though he is improving. Still gets very dyspneic on ambulation, which is not his baseline  Objective: Vitals:   05/07/19 0504 05/07/19 0810 05/07/19 1248 05/07/19 1307  BP:  119/81  116/79   Pulse: 97  91   Resp: 16  20   Temp: 97.8 F (36.6 C)  98.1 F (36.7 C)   TempSrc:   Oral   SpO2: 96% 90% 92% 91%  Weight:      Height:        Intake/Output Summary (Last 24 hours) at 05/07/2019 1629 Last data filed at 05/07/2019 1442 Gross per 24 hour  Intake 720 ml  Output 2000 ml  Net -1280 ml   Filed Weights   05/04/19 1120 05/04/19 1955  Weight: 131.5 kg 130.9 kg    Examination:  General exam: Alert, awake, oriented x 3 Respiratory system: wheezes and rhonchi improving. diminished breath sounds bilaterally. Respiratory effort normal. Cardiovascular system:RRR. No murmurs, rubs, gallops. Gastrointestinal system: Abdomen is nondistended, soft and nontender. No organomegaly or masses felt. Normal bowel sounds heard. Central nervous system: Alert and oriented. No focal neurological deficits. Extremities: 1+ edema bilaterally Skin: No rashes, lesions or ulcers Psychiatry: Judgement and insight appear normal. Mood & affect appropriate.  .   Data Reviewed: I have personally reviewed following labs and imaging studies  CBC: Recent Labs  Lab 05/04/19 1130  WBC 8.3  NEUTROABS 5.0  HGB 15.0  HCT 44.7  MCV 89.4  PLT 99991111   Basic Metabolic Panel: Recent Labs  Lab 05/04/19 1130 05/04/19 1537 05/05/19 0345 05/06/19 0357 05/07/19 0554  NA 139  --  139 138 138  K 3.3*  --  3.7 3.8 3.7  CL 98  --  99 99 98  CO2 29  --  27 26 27   GLUCOSE  113*  --  143* 143* 165*  BUN 15  --  23* 34* 39*  CREATININE 1.81*  --  2.33* 2.01* 1.95*  CALCIUM 9.1  --  9.5 9.5 9.5  MG  --  1.9  --   --   --    GFR: Estimated Creatinine Clearance: 56.2 mL/min (A) (by C-G formula based on SCr of 1.95 mg/dL (H)). Liver Function Tests: Recent Labs  Lab 05/04/19 1130  AST 24  ALT 33  ALKPHOS 61  BILITOT 1.4*  PROT 8.0  ALBUMIN 4.3   No results for input(s): LIPASE, AMYLASE in the last 168 hours. No results for input(s): AMMONIA in the last 168 hours. Coagulation  Profile: No results for input(s): INR, PROTIME in the last 168 hours. Cardiac Enzymes: No results for input(s): CKTOTAL, CKMB, CKMBINDEX, TROPONINI in the last 168 hours. BNP (last 3 results) No results for input(s): PROBNP in the last 8760 hours. HbA1C: No results for input(s): HGBA1C in the last 72 hours. CBG: No results for input(s): GLUCAP in the last 168 hours. Lipid Profile: No results for input(s): CHOL, HDL, LDLCALC, TRIG, CHOLHDL, LDLDIRECT in the last 72 hours. Thyroid Function Tests: No results for input(s): TSH, T4TOTAL, FREET4, T3FREE, THYROIDAB in the last 72 hours. Anemia Panel: No results for input(s): VITAMINB12, FOLATE, FERRITIN, TIBC, IRON, RETICCTPCT in the last 72 hours. Sepsis Labs: No results for input(s): PROCALCITON, LATICACIDVEN in the last 168 hours.  Recent Results (from the past 240 hour(s))  SARS CORONAVIRUS 2 (TAT 6-24 HRS) Nasopharyngeal Nasopharyngeal Swab     Status: None   Collection Time: 05/04/19 12:16 PM   Specimen: Nasopharyngeal Swab  Result Value Ref Range Status   SARS Coronavirus 2 NEGATIVE NEGATIVE Final    Comment: (NOTE) SARS-CoV-2 target nucleic acids are NOT DETECTED. The SARS-CoV-2 RNA is generally detectable in upper and lower respiratory specimens during the acute phase of infection. Negative results do not preclude SARS-CoV-2 infection, do not rule out co-infections with other pathogens, and should not be used as the sole basis for treatment or other patient management decisions. Negative results must be combined with clinical observations, patient history, and epidemiological information. The expected result is Negative. Fact Sheet for Patients: SugarRoll.be Fact Sheet for Healthcare Providers: https://www.woods-mathews.com/ This test is not yet approved or cleared by the [place] FDA and  has been authorized for detection and/or diagnosis of SARS-CoV-2 by FDA under an Emergency  Use Authorization (EUA). This EUA will remain  in effect (meaning this test can be used) for the duration of the COVID-19 declaration under Section 56 4(b)(1) of the Act, 21 U.S.C. section 360bbb-3(b)(1), unless the authorization is terminated or revoked sooner. Performed at [facility], [place] [address], [place], [place] [address]          Radiology Studies: No results found.      Scheduled Meds: . amLODipine  5 mg Oral Daily  . budesonide  0.25 mg Nebulization BID  . enoxaparin (LOVENOX) injection  65 mg Subcutaneous Q24H  . guaiFENesin  1,200 mg Oral BID  . ipratropium-albuterol  3 mL Nebulization Q4H  . metoprolol tartrate  25 mg Oral BID  . montelukast  10 mg Oral QHS  . pneumococcal 23 valent vaccine  0.5 mL Intramuscular Tomorrow-1000  . [START ON 05/08/2019] predniSONE  40 mg Oral Q breakfast   Continuous Infusions: . azithromycin 500 mg (05/06/19 1707)     LOS: 2 days    Time spent: 18mins  Kathie Dike, MD [facility]   If 7PM-7AM, please contact night-coverage [email or web address]  05/07/2019, 4:29 PM

## 2019-05-07 NOTE — Progress Notes (Addendum)
Patient went to bathroom earlier and became very sob -wheezing audibly having trouble breathing. SOB subsided with rest and no exertion. Patient needs pulmonary consult about his airway. Tracheomalacia ?? Patient often sounds worse after awakening.

## 2019-05-07 NOTE — Progress Notes (Signed)
Patient has audible upper airway exp wheezes which appear for the most part to be transmitted to lower ( lung [place]) Lungs are mostly diminish. Suspect this is some form of bronchitis. X-ray appeared clear for lungs. Would look for some form of upper airway obstruction though this mostly occurs on inspiration. Does have spacing between ribs as asthma presents. Air trapping . Also has degree of kidney failure.

## 2019-05-08 MED ORDER — PREDNISONE 20 MG PO TABS: ORAL_TABLET | ORAL | 0 refills | Status: DC

## 2019-05-08 MED ORDER — IPRATROPIUM-ALBUTEROL 0.5-2.5 (3) MG/3ML IN SOLN: 3.0000 mL | Freq: Four times a day (QID) | RESPIRATORY_TRACT | 0 refills | Status: DC

## 2019-05-08 MED ORDER — GUAIFENESIN ER 600 MG PO TB12: 600.0000 mg | ORAL_TABLET | Freq: Two times a day (BID) | ORAL | Status: DC

## 2019-05-08 NOTE — Discharge Summary (Signed)
Physician Discharge Summary  Thomas [name] [id number] DOB: 08-09-1960 DOA: 05/04/2019  PCP: Asencion Noble, MD  Admit date: 05/04/2019 Discharge date: 05/08/2019  Admitted From: Home Disposition: Home  Recommendations for Outpatient Follow-up:  1. Follow up with PCP in 1-2 weeks 2. Please obtain BMP/CBC in one week 3. Follow-up with primary pulmonologist, Dr. Lamonte Sakai in the next 1 to 2 weeks  Discharge Condition: Stable CODE STATUS: Full code Diet recommendation: Heart healthy  Brief/Interim Summary: 59 year old male with a history of hypertension, chronic kidney disease, prostate cancer and COPD, admitted to the hospital with shortness of breath secondary to COPD exacerbation.  Admitted for bronchodilators and steroid therapy.  Discharge Diagnoses:  Active Problems:   Essential hypertension   PROSTATE CANCER, HX OF   OSA (obstructive sleep apnea)   CKD (chronic kidney disease), stage III   Acute respiratory failure with hypoxia (HCC)   COPD exacerbation (HCC)   Obesity, Class III, BMI 40-49.9 (morbid obesity) ([place])  1. Acute hypoxic respiratory failure secondary to COPD exacerbation.    Patient was treated with intravenous steroids, bronchodilators and antibiotics.  He has improved significantly since admission.  He is able to ambulate on room air and does not become hypoxic.  At this point, he feels ready to discharge home.  He has been started on a prednisone taper.  He is continued on Symbicort and already has a nebulizer at home.  He is advised to follow-up with his primary pulmonologist. 2. Hypokalemia.  Replaced.  Magnesium normal 3. Hypertension.  Stable on metoprolol and Norvasc. 4. Obstructive sleep apnea.  Reports that he is intolerant of CPAP. He may benefit from trying a new mask.  This is being arranged through his pulmonologist 5. Chronic kidney disease stage IIIb.  Baseline creatinine of 1.8.    Continue on home dose of torsemide 6. History of prostate cancer.  Status  post prostatectomy in the past.  Discharge Instructions  Discharge Instructions    Diet - low sodium heart healthy   Complete by: As directed    Increase activity slowly   Complete by: As directed      Allergies as of 05/08/2019   No Known Allergies     Medication List    TAKE these medications   Albuterol Sulfate 108 (90 Base) MCG/ACT Aepb Inhale 2 puffs into the lungs every 6 (six) hours as needed (sob, wheeze).   amLODipine 5 MG tablet Commonly known as: NORVASC Take 1 tablet (5 mg total) by mouth daily.   budesonide-formoterol 160-4.5 MCG/ACT inhaler Commonly known as: SYMBICORT Inhale 2 puffs into the lungs 2 (two) times daily.   ipratropium-albuterol 0.5-2.5 (3) MG/3ML Soln Commonly known as: DUONEB Take 3 mLs by nebulization 4 (four) times daily.   metoprolol tartrate 25 MG tablet Commonly known as: LOPRESSOR Take 1 tablet (25 mg total) by mouth 2 (two) times daily.   montelukast 10 MG tablet Commonly known as: SINGULAIR Take 10 mg by mouth at bedtime.   predniSONE 20 MG tablet Commonly known as: DELTASONE Take 40mg  po daily for 2 days then 30mg  daily for 2 days then 20mg  daily for 2 days then 10mg  daily for 2 days then stop   torsemide 20 MG tablet Commonly known as: DEMADEX Take 20 mg by mouth daily.      Follow-up Information    Asencion Noble, MD. Schedule an appointment as soon as possible for a visit in 2 week(s).   Specialty: Internal Medicine Contact information: [address] [place]  [address] [phone]          No Known Allergies  Consultations:     Procedures/Studies: DG Chest Port 1 View  Result Date: 05/04/2019 CLINICAL DATA:  Worsening shortness of breath over the past 3-4 days. EXAM: PORTABLE CHEST 1 VIEW COMPARISON:  09/16/2018 FINDINGS: The cardiac silhouette, mediastinal and hilar contours are normal and stable. The lungs are clear. No pleural effusions. No pulmonary lesions. The bony thorax is intact.  IMPRESSION: No acute cardiopulmonary findings. Electronically Signed   By: Marijo Sanes M.D.   On: 05/04/2019 12:44      Subjective: Overall he feels her breathing is improving.  Feels that wheezing and cough is better with bronchodilator therapy.  Able to ambulate off of oxygen and maintain saturations greater than 90%.  Discharge Exam: Vitals:   05/08/19 0739 05/08/19 1252 05/08/19 1313 05/08/19 1650  BP:   110/66   Pulse:   82   Resp:   20   Temp:   97.9 F (36.6 C)   TempSrc:   Oral   SpO2: 95% 92% 94% (!) 3%  Weight:      Height:        General: Pt is alert, awake, not in acute distress Cardiovascular: RRR, S1/S2 +, no rubs, no gallops Respiratory: Mild wheeze and rhonchi bilaterally Abdominal: Soft, NT, ND, bowel sounds + Extremities: 1+ edema, no cyanosis    The results of significant diagnostics from this hospitalization (including imaging, microbiology, ancillary and laboratory) are listed below for reference.     Microbiology: Recent Results (from the past 240 hour(s))  SARS CORONAVIRUS 2 (TAT 6-24 HRS) Nasopharyngeal Nasopharyngeal Swab     Status: None   Collection Time: 05/04/19 12:16 PM   Specimen: Nasopharyngeal Swab  Result Value Ref Range Status   SARS Coronavirus 2 NEGATIVE NEGATIVE Final    Comment: (NOTE) SARS-CoV-2 target nucleic acids are NOT DETECTED. The SARS-CoV-2 RNA is generally detectable in upper and lower respiratory specimens during the acute phase of infection. Negative results do not preclude SARS-CoV-2 infection, do not rule out co-infections with other pathogens, and should not be used as the sole basis for treatment or other patient management decisions. Negative results must be combined with clinical observations, patient history, and epidemiological information. The expected result is Negative. Fact Sheet for Patients: SugarRoll.be Fact Sheet for Healthcare  Providers: https://www.woods-mathews.com/ This test is not yet approved or cleared by the [place] FDA and  has been authorized for detection and/or diagnosis of SARS-CoV-2 by FDA under an Emergency Use Authorization (EUA). This EUA will remain  in effect (meaning this test can be used) for the duration of the COVID-19 declaration under Section 56 4(b)(1) of the Act, 21 U.S.C. section 360bbb-3(b)(1), unless the authorization is terminated or revoked sooner. Performed at [facility], [place] [address] [place], [place] [address]      Labs: BNP (last 3 results) Recent Labs    09/16/18 1536 05/04/19 1100  BNP 23.0 [id number]   Basic Metabolic Panel: Recent Labs  Lab 05/04/19 1130 05/04/19 1537 05/05/19 0345 05/06/19 0357 05/07/19 0554  NA 139  --  139 138 138  K 3.3*  --  3.7 3.8 3.7  CL 98  --  99 99 98  CO2 29  --  27 26 27   GLUCOSE 113*  --  143* 143* 165*  BUN 15  --  23* 34* 39*  CREATININE 1.81*  --  2.33* 2.01* 1.95*  CALCIUM 9.1  --  9.5 9.5 9.5  MG  --  1.9  --   --   --    Liver Function Tests: Recent Labs  Lab 05/04/19 1130  AST 24  ALT 33  ALKPHOS 61  BILITOT 1.4*  PROT 8.0  ALBUMIN 4.3   No results for input(s): LIPASE, AMYLASE in the last 168 hours. No results for input(s): AMMONIA in the last 168 hours. CBC: Recent Labs  Lab 05/04/19 1130  WBC 8.3  NEUTROABS 5.0  HGB 15.0  HCT 44.7  MCV 89.4  PLT 267   Cardiac Enzymes: No results for input(s): CKTOTAL, CKMB, CKMBINDEX, TROPONINI in the last 168 hours. BNP: Invalid input(s): POCBNP CBG: No results for input(s): GLUCAP in the last 168 hours. D-Dimer No results for input(s): DDIMER in the last 72 hours. Hgb A1c No results for input(s): HGBA1C in the last 72 hours. Lipid Profile No results for input(s): CHOL, HDL, LDLCALC, TRIG, CHOLHDL, LDLDIRECT in the last 72 hours. Thyroid function studies No results for input(s): TSH, T4TOTAL, T3FREE, THYROIDAB in the last 72  hours.  Invalid input(s): FREET3 Anemia work up No results for input(s): VITAMINB12, FOLATE, FERRITIN, TIBC, IRON, RETICCTPCT in the last 72 hours. Urinalysis    Component Value Date/Time   COLORURINE YELLOW 10/17/2015 1900   APPEARANCEUR CLEAR 10/17/2015 1900   LABSPEC 1.015 10/17/2015 1900   PHURINE 6.0 10/17/2015 1900   GLUCOSEU NEGATIVE 10/17/2015 1900   HGBUR MODERATE (A) 10/17/2015 1900   BILIRUBINUR NEGATIVE 10/17/2015 1900   KETONESUR NEGATIVE 10/17/2015 1900   PROTEINUR 30 (A) 10/17/2015 1900   NITRITE NEGATIVE 10/17/2015 1900   LEUKOCYTESUR NEGATIVE 10/17/2015 1900   Sepsis Labs Invalid input(s): PROCALCITONIN,  WBC,  LACTICIDVEN Microbiology Recent Results (from the past 240 hour(s))  SARS CORONAVIRUS 2 (TAT 6-24 HRS) Nasopharyngeal Nasopharyngeal Swab     Status: None   Collection Time: 05/04/19 12:16 PM   Specimen: Nasopharyngeal Swab  Result Value Ref Range Status   SARS Coronavirus 2 NEGATIVE NEGATIVE Final    Comment: (NOTE) SARS-CoV-2 target nucleic acids are NOT DETECTED. The SARS-CoV-2 RNA is generally detectable in upper and lower respiratory specimens during the acute phase of infection. Negative results do not preclude SARS-CoV-2 infection, do not rule out co-infections with other pathogens, and should not be used as the sole basis for treatment or other patient management decisions. Negative results must be combined with clinical observations, patient history, and epidemiological information. The expected result is Negative. Fact Sheet for Patients: SugarRoll.be Fact Sheet for Healthcare Providers: https://www.woods-mathews.com/ This test is not yet approved or cleared by the [place] FDA and  has been authorized for detection and/or diagnosis of SARS-CoV-2 by FDA under an Emergency Use Authorization (EUA). This EUA will remain  in effect (meaning this test can be used) for the duration of the COVID-19  declaration under Section 56 4(b)(1) of the Act, 21 U.S.C. section 360bbb-3(b)(1), unless the authorization is terminated or revoked sooner. Performed at [facility], [place] [address] [place], [place] [address]      Time coordinating discharge: 34mins  SIGNED:   Kathie Dike, MD  [facility] 05/08/2019, 6:29 PM   If 7PM-7AM, please contact night-coverage [email or web address]

## 2019-05-08 NOTE — Discharge Instructions (Signed)
Chronic Obstructive Pulmonary Disease Exacerbation  Chronic obstructive pulmonary disease (COPD) is a long-term (chronic) condition that affects the lungs. COPD is a general term that can be used to describe many different lung problems that cause lung swelling (inflammation) and limit airflow, including chronic bronchitis and emphysema. COPD exacerbations are episodes when breathing symptoms become much worse and require extra treatment. COPD exacerbations are usually caused by infections. Without treatment, COPD exacerbations can be severe and even life threatening. Frequent COPD exacerbations can cause further damage to the lungs. What are the causes? This condition may be caused by:  Respiratory infections, including viral and bacterial infections.  Exposure to smoke.  Exposure to air pollution, chemical fumes, or dust.  Things that give you an allergic reaction (allergens).  Not taking your usual COPD medicines as directed.  Underlying medical problems, such as congestive heart failure or infections not involving the lungs. In many cases, the cause (trigger) of this condition is not known. What increases the risk? The following factors may make you more likely to develop this condition:  Smoking cigarettes.  Old age.  Frequent prior COPD exacerbations. What are the signs or symptoms? Symptoms of this condition include:  Increased coughing.  Increased production of mucus from your lungs (sputum).  Increased wheezing.  Increased shortness of breath.  Rapid or labored breathing.  Chest tightness.  Less energy than usual.  Sleep disruption from symptoms.  Confusion or increased sleepiness. Often these symptoms happen or get worse even with the use of medicines. How is this diagnosed? This condition is diagnosed based on:  Your medical history.  A physical exam. You may also have tests, including:  A chest X-ray.  Blood tests.  Lung (pulmonary) function  tests. How is this treated? Treatment for this condition depends on the severity and cause of the symptoms. You may need to be admitted to a hospital for treatment. Some of the treatments commonly used to treat COPD exacerbations are:  Antibiotic medicines. These may be used for severe exacerbations caused by a lung infection, such as pneumonia.  Bronchodilators. These are inhaled medicines that expand the air passages and allow increased airflow.  Steroid medicines. These act to reduce inflammation in the airways. They may be given with an inhaler, taken by mouth, or given through an IV tube inserted into one of your veins.  Supplemental oxygen therapy.  Airway clearing techniques, such as noninvasive ventilation (NIV) and positive expiratory pressure (PEP). These provide respiratory support through a mask or other noninvasive device. An example of this would be using a continuous positive airway pressure (CPAP) machine to improve delivery of oxygen into your lungs. Follow these instructions at home: Medicines  Take over-the-counter and prescription medicines only as told by your health care provider. It is important to use correct technique with inhaled medicines.  If you were prescribed an antibiotic medicine or oral steroid, take it as told by your health care provider. Do not stop taking the medicine even if you start to feel better. Lifestyle  Eat a healthy diet.  Exercise regularly.  Get plenty of sleep.  Avoid exposure to all substances that irritate the airway, especially to tobacco smoke.  Wash your hands often with soap and water to reduce the risk of infection. If soap and water are not available, use hand sanitizer.  During flu season, avoid enclosed spaces that are crowded with people. General instructions  Drink enough fluid to keep your urine clear or pale yellow (unless you have a medical   condition that requires fluid restriction).  Use a cool mist vaporizer. This  humidifies the air and makes it easier for you to clear your chest when you cough.  If you have a home nebulizer and oxygen, continue to use them as told by your health care provider.  Keep all follow-up visits as told by your health care provider. This is important. How is this prevented?  Stay up-to-date on pneumococcal and influenza (flu) vaccines. A flu shot is recommended every year to help prevent exacerbations.  Do not use any products that contain nicotine or tobacco, such as cigarettes and e-cigarettes. Quitting smoking is very important in preventing COPD from getting worse and in preventing exacerbations from happening as often. If you need help quitting, ask your health care provider.  Follow all instructions for pulmonary rehabilitation after a recent exacerbation. This can help prevent future exacerbations.  Work with your health care provider to develop and follow an action plan. This tells you what steps to take when you experience certain symptoms. Contact a health care provider if:  You have a worsening of your regular COPD symptoms. Get help right away if:  You have worsening shortness of breath, even when resting.  You have trouble talking.  You have severe chest pain.  You cough up blood.  You have a fever.  You have weakness, vomit repeatedly, or faint.  You feel confused.  You are not able to sleep because of your symptoms.  You have trouble doing daily activities. Summary  COPD exacerbations are episodes when breathing symptoms become much worse and require extra treatment above your normal treatment.  Exacerbations can be severe and even life threatening. Frequent COPD exacerbations can cause further damage to your lungs.  COPD exacerbations are usually triggered by infections such as the flu, colds, and even pneumonia.  Treatment for this condition depends on the severity and cause of the symptoms. You may need to be admitted to a hospital for  treatment.  Quitting smoking is very important to prevent COPD from getting worse and to prevent exacerbations from happening as often. This information is not intended to replace advice given to you by your health care provider. Make sure you discuss any questions you have with your health care provider. Document Revised: 01/30/2017 Document Reviewed: 03/24/2016 Elsevier Patient Education  2020 [employer]  

## 2019-05-08 NOTE — Progress Notes (Deleted)
Physician Discharge Summary  Thomas [name] [id number] DOB: 1960/12/16 DOA: 05/04/2019  PCP: Asencion Noble, MD  Admit date: 05/04/2019 Discharge date: 05/08/2019  Admitted From: Home Disposition: Home  Recommendations for Outpatient Follow-up:  1. Follow up with PCP in 1-2 weeks 2. Please obtain BMP/CBC in one week 3. Follow-up with primary pulmonologist, Dr. Lamonte Sakai in the next 1 to 2 weeks  Discharge Condition: Stable CODE STATUS: Full code Diet recommendation: Heart healthy  Brief/Interim Summary: 59 year old male with a history of hypertension, chronic kidney disease, prostate cancer and COPD, admitted to the hospital with shortness of breath secondary to COPD exacerbation.  Admitted for bronchodilators and steroid therapy.  Discharge Diagnoses:  Active Problems:   Essential hypertension   PROSTATE CANCER, HX OF   OSA (obstructive sleep apnea)   CKD (chronic kidney disease), stage III   Acute respiratory failure with hypoxia (HCC)   COPD exacerbation (HCC)   Obesity, Class III, BMI 40-49.9 (morbid obesity) ([place])  1. Acute hypoxic respiratory failure secondary to COPD exacerbation.    Patient was treated with intravenous steroids, bronchodilators and antibiotics.  He has improved significantly since admission.  He is able to ambulate on room air and does not become hypoxic.  At this point, he feels ready to discharge home.  He has been started on a prednisone taper.  He is continued on Symbicort and already has a nebulizer at home.  He is advised to follow-up with his primary pulmonologist. 2. Hypokalemia.  Replaced.  Magnesium normal 3. Hypertension.  Stable on metoprolol and Norvasc. 4. Obstructive sleep apnea.  Reports that he is intolerant of CPAP. He may benefit from trying a new mask.  This is being arranged through his pulmonologist 5. Chronic kidney disease stage IIIb.  Baseline creatinine of 1.8.    Continue on home dose of torsemide 6. History of prostate cancer.  Status  post prostatectomy in the past.  Discharge Instructions  Discharge Instructions    Diet - low sodium heart healthy   Complete by: As directed    Increase activity slowly   Complete by: As directed      Allergies as of 05/08/2019   No Known Allergies     Medication List    TAKE these medications   Albuterol Sulfate 108 (90 Base) MCG/ACT Aepb Inhale 2 puffs into the lungs every 6 (six) hours as needed (sob, wheeze).   amLODipine 5 MG tablet Commonly known as: NORVASC Take 1 tablet (5 mg total) by mouth daily.   budesonide-formoterol 160-4.5 MCG/ACT inhaler Commonly known as: SYMBICORT Inhale 2 puffs into the lungs 2 (two) times daily.   ipratropium-albuterol 0.5-2.5 (3) MG/3ML Soln Commonly known as: DUONEB Take 3 mLs by nebulization 4 (four) times daily.   metoprolol tartrate 25 MG tablet Commonly known as: LOPRESSOR Take 1 tablet (25 mg total) by mouth 2 (two) times daily.   montelukast 10 MG tablet Commonly known as: SINGULAIR Take 10 mg by mouth at bedtime.   predniSONE 20 MG tablet Commonly known as: DELTASONE Take 40mg  po daily for 2 days then 30mg  daily for 2 days then 20mg  daily for 2 days then 10mg  daily for 2 days then stop   torsemide 20 MG tablet Commonly known as: DEMADEX Take 20 mg by mouth daily.      Follow-up Information    Asencion Noble, MD. Schedule an appointment as soon as possible for a visit in 2 week(s).   Specialty: Internal Medicine Contact information: [address] [place]  [address] [phone]          No Known Allergies  Consultations:     Procedures/Studies: DG Chest Port 1 View  Result Date: 05/04/2019 CLINICAL DATA:  Worsening shortness of breath over the past 3-4 days. EXAM: PORTABLE CHEST 1 VIEW COMPARISON:  09/16/2018 FINDINGS: The cardiac silhouette, mediastinal and hilar contours are normal and stable. The lungs are clear. No pleural effusions. No pulmonary lesions. The bony thorax is intact.  IMPRESSION: No acute cardiopulmonary findings. Electronically Signed   By: Marijo Sanes M.D.   On: 05/04/2019 12:44       Subjective: Overall he feels her breathing is improving.  Feels that wheezing and cough is better with bronchodilator therapy.  Able to ambulate off of oxygen and maintain saturations greater than 90%.  Discharge Exam: Vitals:   05/08/19 0739 05/08/19 1252 05/08/19 1313 05/08/19 1650  BP:   110/66   Pulse:   82   Resp:   20   Temp:   97.9 F (36.6 C)   TempSrc:   Oral   SpO2: 95% 92% 94% (!) 3%  Weight:      Height:        General: Pt is alert, awake, not in acute distress Cardiovascular: RRR, S1/S2 +, no rubs, no gallops Respiratory: Mild wheeze and rhonchi bilaterally Abdominal: Soft, NT, ND, bowel sounds + Extremities: 1+ edema, no cyanosis    The results of significant diagnostics from this hospitalization (including imaging, microbiology, ancillary and laboratory) are listed below for reference.     Microbiology: Recent Results (from the past 240 hour(s))  SARS CORONAVIRUS 2 (TAT 6-24 HRS) Nasopharyngeal Nasopharyngeal Swab     Status: None   Collection Time: 05/04/19 12:16 PM   Specimen: Nasopharyngeal Swab  Result Value Ref Range Status   SARS Coronavirus 2 NEGATIVE NEGATIVE Final    Comment: (NOTE) SARS-CoV-2 target nucleic acids are NOT DETECTED. The SARS-CoV-2 RNA is generally detectable in upper and lower respiratory specimens during the acute phase of infection. Negative results do not preclude SARS-CoV-2 infection, do not rule out co-infections with other pathogens, and should not be used as the sole basis for treatment or other patient management decisions. Negative results must be combined with clinical observations, patient history, and epidemiological information. The expected result is Negative. Fact Sheet for Patients: SugarRoll.be Fact Sheet for Healthcare  Providers: https://www.woods-mathews.com/ This test is not yet approved or cleared by the [place] FDA and  has been authorized for detection and/or diagnosis of SARS-CoV-2 by FDA under an Emergency Use Authorization (EUA). This EUA will remain  in effect (meaning this test can be used) for the duration of the COVID-19 declaration under Section 56 4(b)(1) of the Act, 21 U.S.C. section 360bbb-3(b)(1), unless the authorization is terminated or revoked sooner. Performed at [facility], [place] [address] [place], [place] [address]      Labs: BNP (last 3 results) Recent Labs    09/16/18 1536 05/04/19 1100  BNP 23.0 [id number]   Basic Metabolic Panel: Recent Labs  Lab 05/04/19 1130 05/04/19 1537 05/05/19 0345 05/06/19 0357 05/07/19 0554  NA 139  --  139 138 138  K 3.3*  --  3.7 3.8 3.7  CL 98  --  99 99 98  CO2 29  --  27 26 27   GLUCOSE 113*  --  143* 143* 165*  BUN 15  --  23* 34* 39*  CREATININE 1.81*  --  2.33* 2.01* 1.95*  CALCIUM 9.1  --  9.5 9.5 9.5  MG  --  1.9  --   --   --    Liver Function Tests: Recent Labs  Lab 05/04/19 1130  AST 24  ALT 33  ALKPHOS 61  BILITOT 1.4*  PROT 8.0  ALBUMIN 4.3   No results for input(s): LIPASE, AMYLASE in the last 168 hours. No results for input(s): AMMONIA in the last 168 hours. CBC: Recent Labs  Lab 05/04/19 1130  WBC 8.3  NEUTROABS 5.0  HGB 15.0  HCT 44.7  MCV 89.4  PLT 267   Cardiac Enzymes: No results for input(s): CKTOTAL, CKMB, CKMBINDEX, TROPONINI in the last 168 hours. BNP: Invalid input(s): POCBNP CBG: No results for input(s): GLUCAP in the last 168 hours. D-Dimer No results for input(s): DDIMER in the last 72 hours. Hgb A1c No results for input(s): HGBA1C in the last 72 hours. Lipid Profile No results for input(s): CHOL, HDL, LDLCALC, TRIG, CHOLHDL, LDLDIRECT in the last 72 hours. Thyroid function studies No results for input(s): TSH, T4TOTAL, T3FREE, THYROIDAB in the last 72  hours.  Invalid input(s): FREET3 Anemia work up No results for input(s): VITAMINB12, FOLATE, FERRITIN, TIBC, IRON, RETICCTPCT in the last 72 hours. Urinalysis    Component Value Date/Time   COLORURINE YELLOW 10/17/2015 1900   APPEARANCEUR CLEAR 10/17/2015 1900   LABSPEC 1.015 10/17/2015 1900   PHURINE 6.0 10/17/2015 1900   GLUCOSEU NEGATIVE 10/17/2015 1900   HGBUR MODERATE (A) 10/17/2015 1900   BILIRUBINUR NEGATIVE 10/17/2015 1900   KETONESUR NEGATIVE 10/17/2015 1900   PROTEINUR 30 (A) 10/17/2015 1900   NITRITE NEGATIVE 10/17/2015 1900   LEUKOCYTESUR NEGATIVE 10/17/2015 1900   Sepsis Labs Invalid input(s): PROCALCITONIN,  WBC,  LACTICIDVEN Microbiology Recent Results (from the past 240 hour(s))  SARS CORONAVIRUS 2 (TAT 6-24 HRS) Nasopharyngeal Nasopharyngeal Swab     Status: None   Collection Time: 05/04/19 12:16 PM   Specimen: Nasopharyngeal Swab  Result Value Ref Range Status   SARS Coronavirus 2 NEGATIVE NEGATIVE Final    Comment: (NOTE) SARS-CoV-2 target nucleic acids are NOT DETECTED. The SARS-CoV-2 RNA is generally detectable in upper and lower respiratory specimens during the acute phase of infection. Negative results do not preclude SARS-CoV-2 infection, do not rule out co-infections with other pathogens, and should not be used as the sole basis for treatment or other patient management decisions. Negative results must be combined with clinical observations, patient history, and epidemiological information. The expected result is Negative. Fact Sheet for Patients: SugarRoll.be Fact Sheet for Healthcare Providers: https://www.woods-mathews.com/ This test is not yet approved or cleared by the [place] FDA and  has been authorized for detection and/or diagnosis of SARS-CoV-2 by FDA under an Emergency Use Authorization (EUA). This EUA will remain  in effect (meaning this test can be used) for the duration of the COVID-19  declaration under Section 56 4(b)(1) of the Act, 21 U.S.C. section 360bbb-3(b)(1), unless the authorization is terminated or revoked sooner. Performed at [facility], [place] [address] [place], [place] [address]      Time coordinating discharge: 21mins  SIGNED:   Kathie Dike, MD  [facility] 05/08/2019, 6:21 PM   If 7PM-7AM, please contact night-coverage [email or web address]

## 2019-06-03 ENCOUNTER — Ambulatory Visit: Payer: 59 | Attending: Internal Medicine

## 2019-06-03 ENCOUNTER — Ambulatory Visit: Payer: 59

## 2019-06-03 DIAGNOSIS — Z23 Encounter for immunization: Secondary | ICD-10-CM

## 2019-06-03 NOTE — Progress Notes (Signed)
   Covid-19 Vaccination Clinic  Name:  Thomas [name]    MRN: [id number] DOB: 01/24/1961  06/03/2019  [name] was observed post Covid-19 immunization for 15 minutes without incident. He was provided with Vaccine Information Sheet and instruction to access the V-Safe system.   [name] was instructed to call 911 with any severe reactions post vaccine: Marland Kitchen Difficulty breathing  . Swelling of face and throat  . A fast heartbeat  . A bad rash all over body  . Dizziness and weakness   Immunizations Administered    Name Date Dose VIS Date Route   Moderna COVID-19 Vaccine 06/03/2019 12:16 PM 0.5 mL 02/01/2019 Intramuscular   Manufacturer: Moderna   Lot: [id number]   [id number]

## 2019-07-06 ENCOUNTER — Ambulatory Visit: Payer: 59 | Attending: Internal Medicine

## 2019-07-06 DIAGNOSIS — Z23 Encounter for immunization: Secondary | ICD-10-CM

## 2019-07-06 NOTE — Progress Notes (Signed)
   Covid-19 Vaccination Clinic  Name:  Thomas [name]    MRN: [id number] DOB: 1961/02/24  07/06/2019  Mr. Thomas [name] was observed post Covid-19 immunization for 15 minutes without incident. He was provided with Vaccine Information Sheet and instruction to access the V-Safe system.   Mr. Thomas [name] was instructed to call 911 with any severe reactions post vaccine: Marland Kitchen Difficulty breathing  . Swelling of face and throat  . A fast heartbeat  . A bad rash all over body  . Dizziness and weakness   Immunizations Administered    Name Date Dose VIS Date Route   Moderna COVID-19 Vaccine 07/06/2019 12:03 PM 0.5 mL 02/2019 Intramuscular   Manufacturer: Moderna   Lot: [id number]   [id number]

## 2019-07-24 IMAGING — US VENOUS DOPPLER ULTRASOUND OF  LOWER EXTREMITIES
1 series · 14 of 24 positions shown · non-contrast
Comparison: 05/05/2017

CLINICAL DATA: Edema

EXAM:
BILATERAL LOWER EXTREMITY VENOUS DOPPLER ULTRASOUND
TECHNIQUE: Gray-scale sonography with compression, as well as color and duplex
ultrasound, were performed to evaluate the deep venous system from
the level of the common femoral vein through the popliteal and
proximal calf veins.

[Series 1: venous doppler ultrasound of lower extremities · 0.08mm/px · 14 of 129 slices shown]
[im 1/129]
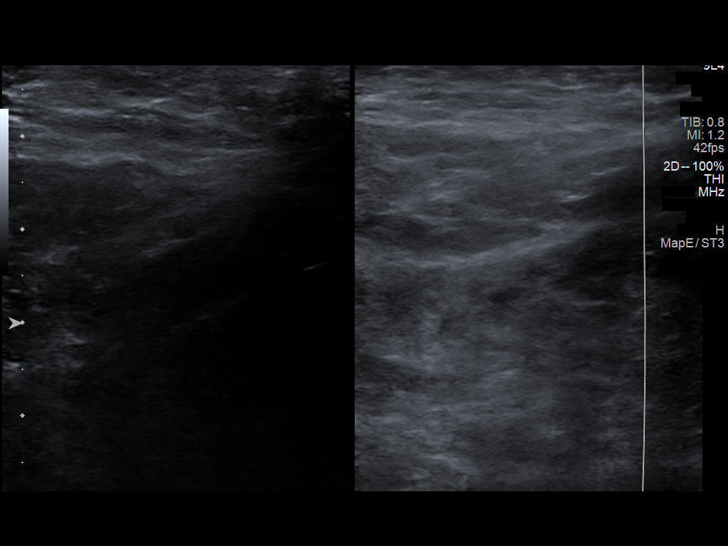
[im 12/129]
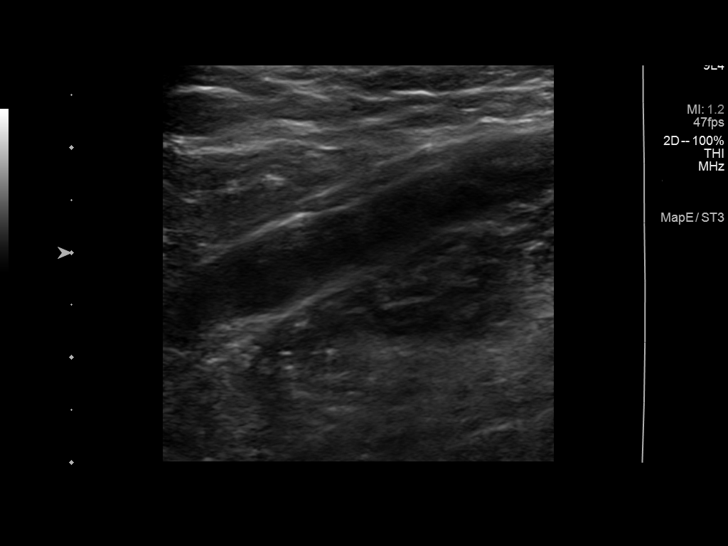
[im 23/129]
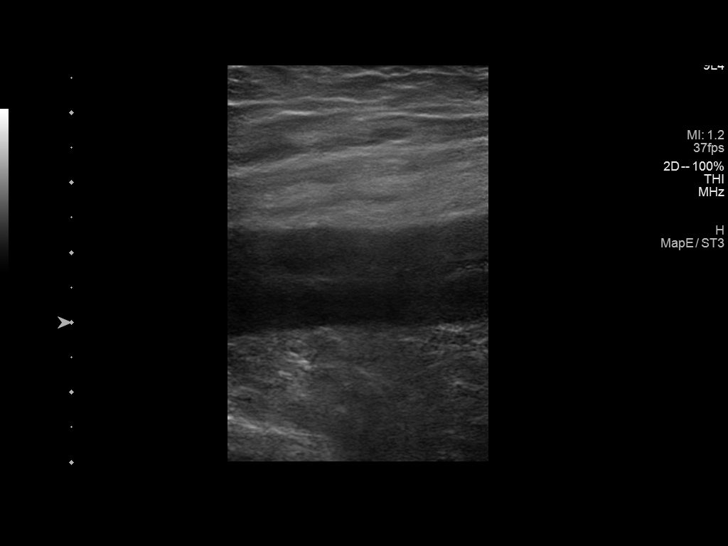
[im 34/129]
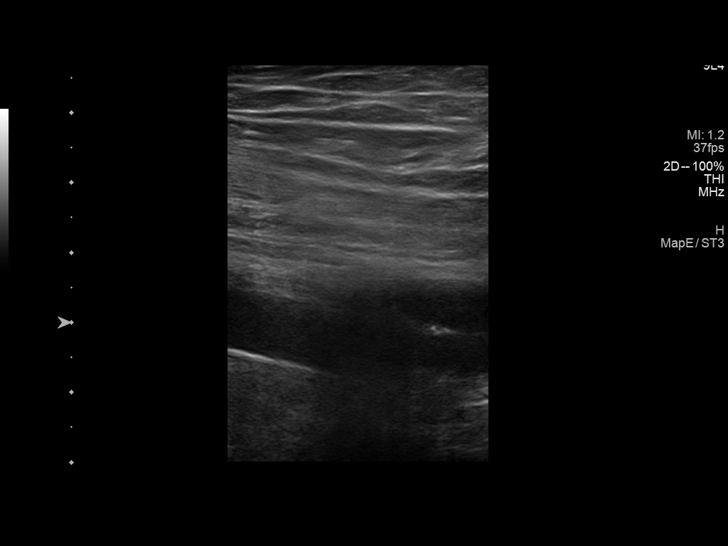
[im 39/129]
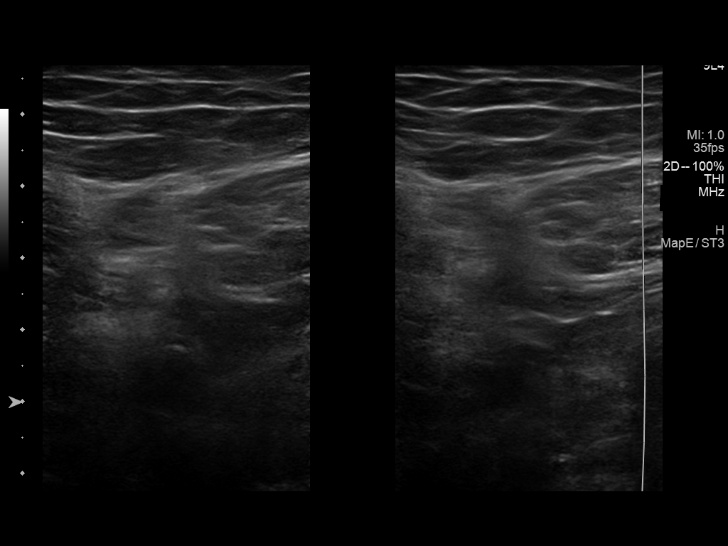
[im 51/129]
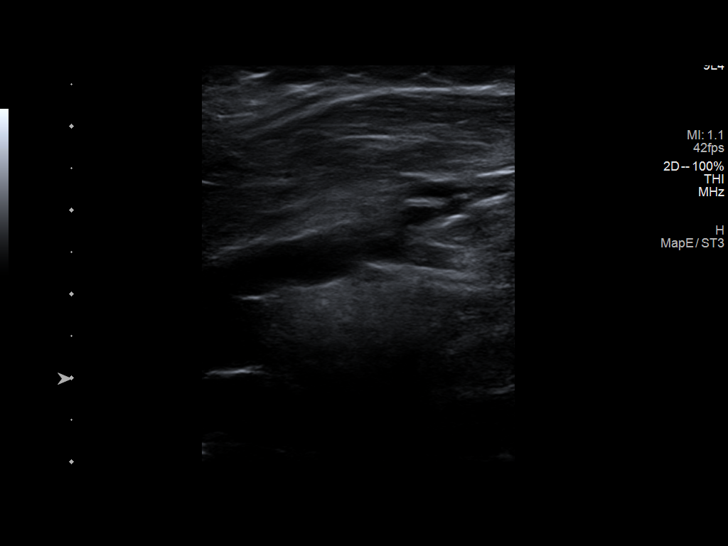
[im 62/129]
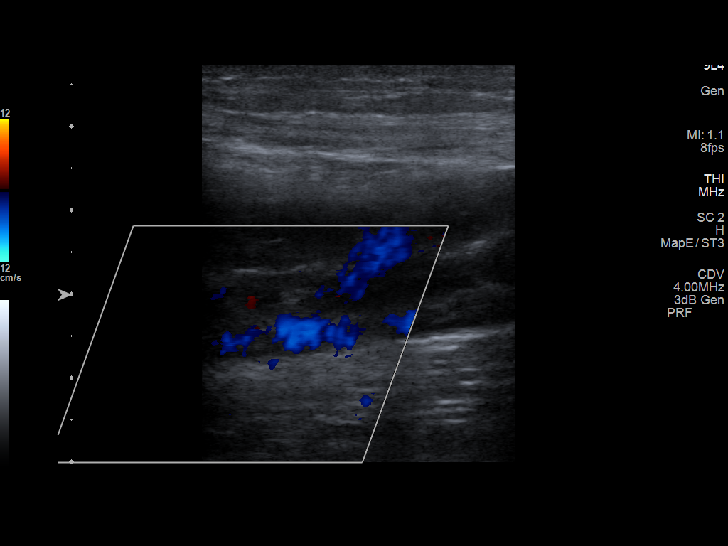
[im 67/129]
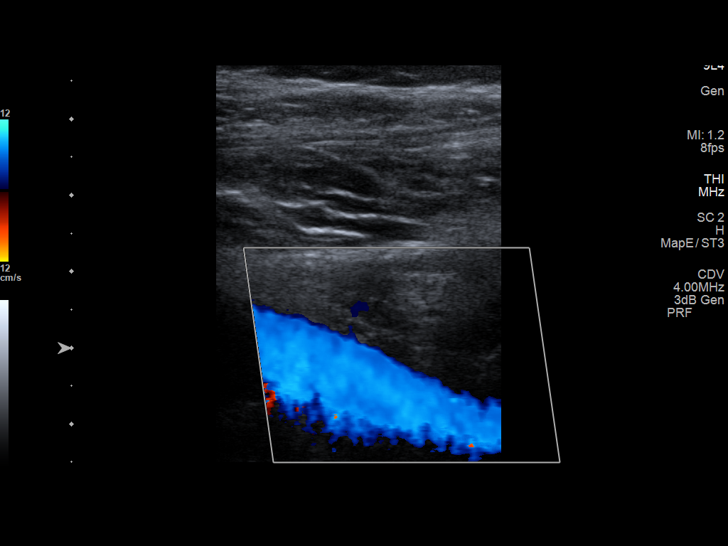
[im 78/129]
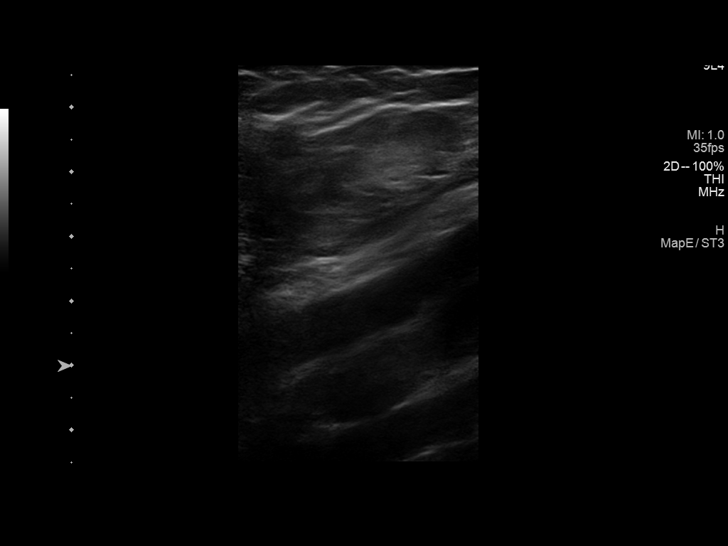
[im 90/129]
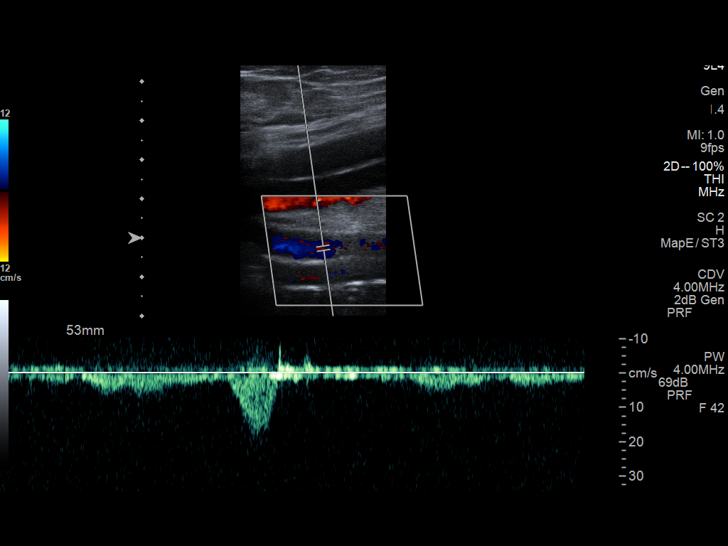
[im 101/129]
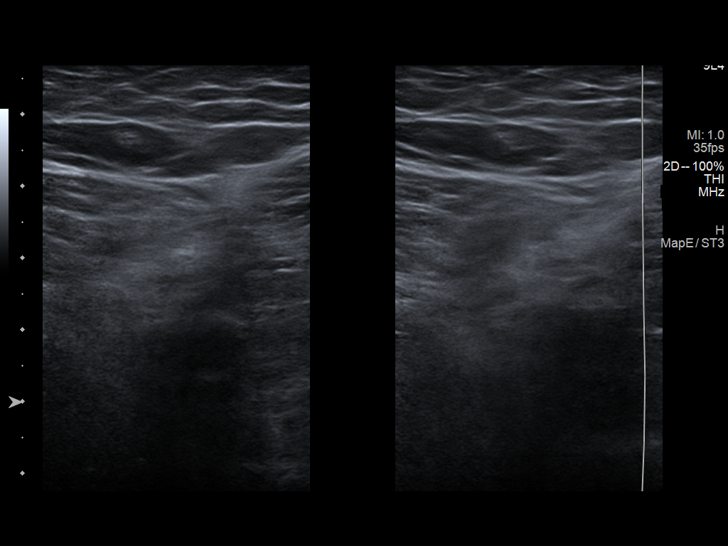
[im 106/129]
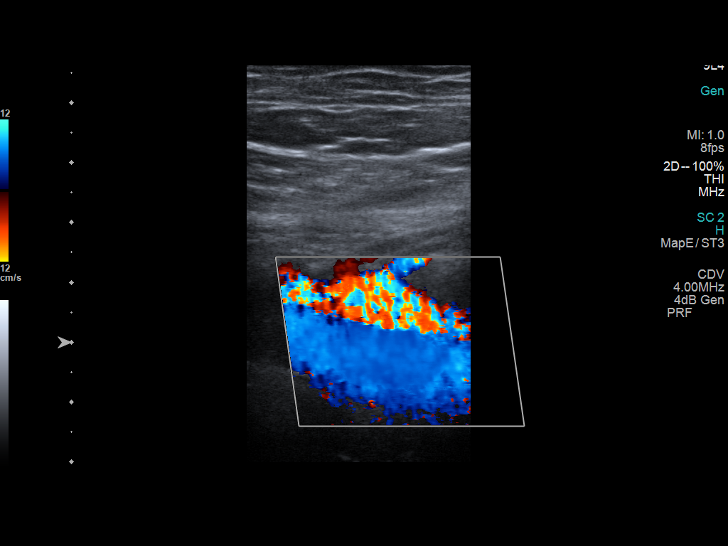
[im 117/129]
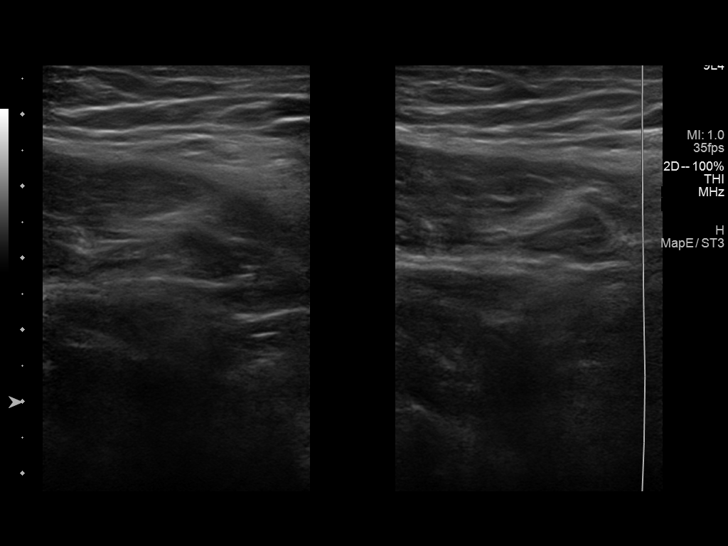
[im 129/129]
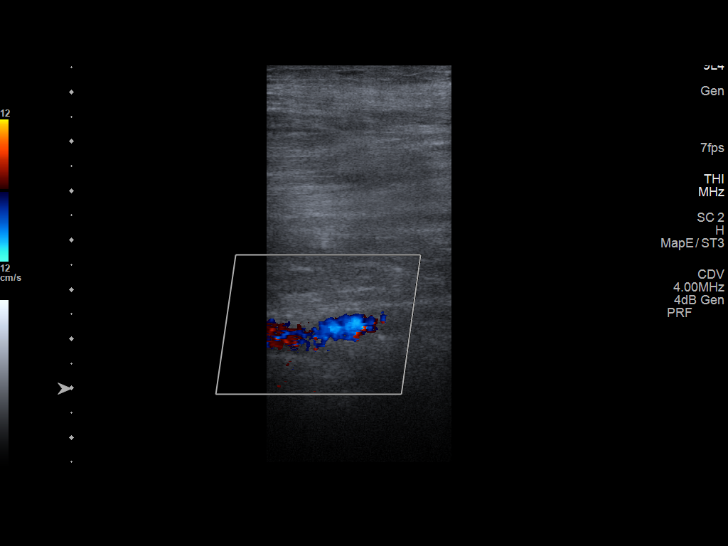

[14 of 24 positions shown; findings below may reference images not displayed]

FINDINGS: Normal compressibility of the common femoral, superficial femoral,
and popliteal veins, as well as the proximal calf veins. No filling
defects to suggest DVT on grayscale or color Doppler imaging.
Doppler waveforms show normal direction of venous flow, normal
respiratory phasicity and response to augmentation. Visualized
segments of the saphenous venous systems normal in caliber and
compressibility.
IMPRESSION: No femoropopliteal and no calf DVT in the visualized calf veins. If
clinical symptoms are inconsistent or if there are persistent or
worsening symptoms, further imaging (possibly involving the iliac
veins) may be warranted.

## 2019-11-28 ENCOUNTER — Other Ambulatory Visit: Payer: Self-pay

## 2019-11-28 ENCOUNTER — Ambulatory Visit (HOSPITAL_COMMUNITY)
Admission: RE | Admit: 2019-11-28 | Discharge: 2019-11-28 | Disposition: A | Discharge status: Home / Self Care | Payer: 59 | Source: Ambulatory Visit | Attending: Family Medicine | Admitting: Family Medicine

## 2019-11-28 ENCOUNTER — Other Ambulatory Visit (HOSPITAL_COMMUNITY): Payer: Self-pay | Admitting: Family Medicine

## 2019-11-28 DIAGNOSIS — M79671 Pain in right foot: Secondary | ICD-10-CM

## 2019-11-28 DIAGNOSIS — M25571 Pain in right ankle and joints of right foot: Secondary | ICD-10-CM | POA: Insufficient documentation

## 2020-01-23 ENCOUNTER — Other Ambulatory Visit: Payer: Self-pay

## 2020-01-23 ENCOUNTER — Inpatient Hospital Stay (HOSPITAL_COMMUNITY)
Admission: EM | Admit: 2020-01-23 | Discharge: 2020-01-27 | DRG: 291 | Disposition: A | Discharge status: Home / Self Care | Payer: 59 | Attending: Internal Medicine | Admitting: Internal Medicine

## 2020-01-23 ENCOUNTER — Emergency Department (HOSPITAL_COMMUNITY): Payer: 59

## 2020-01-23 ENCOUNTER — Encounter (HOSPITAL_COMMUNITY): Payer: Self-pay | Admitting: *Deleted

## 2020-01-23 DIAGNOSIS — K219 Gastro-esophageal reflux disease without esophagitis: Secondary | ICD-10-CM | POA: Diagnosis present

## 2020-01-23 DIAGNOSIS — R600 Localized edema: Secondary | ICD-10-CM | POA: Diagnosis not present

## 2020-01-23 DIAGNOSIS — I130 Hypertensive heart and chronic kidney disease with heart failure and stage 1 through stage 4 chronic kidney disease, or unspecified chronic kidney disease: Secondary | ICD-10-CM | POA: Diagnosis not present

## 2020-01-23 DIAGNOSIS — N1832 Chronic kidney disease, stage 3b: Secondary | ICD-10-CM | POA: Diagnosis present

## 2020-01-23 DIAGNOSIS — I5033 Acute on chronic diastolic (congestive) heart failure: Secondary | ICD-10-CM

## 2020-01-23 DIAGNOSIS — Z87891 Personal history of nicotine dependence: Secondary | ICD-10-CM

## 2020-01-23 DIAGNOSIS — Z7951 Long term (current) use of inhaled steroids: Secondary | ICD-10-CM

## 2020-01-23 DIAGNOSIS — I313 Pericardial effusion (noninflammatory): Secondary | ICD-10-CM | POA: Diagnosis present

## 2020-01-23 DIAGNOSIS — N183 Chronic kidney disease, stage 3 unspecified: Secondary | ICD-10-CM | POA: Diagnosis present

## 2020-01-23 DIAGNOSIS — R7303 Prediabetes: Secondary | ICD-10-CM | POA: Diagnosis present

## 2020-01-23 DIAGNOSIS — E66813 Obesity, class 3: Secondary | ICD-10-CM | POA: Diagnosis present

## 2020-01-23 DIAGNOSIS — Z20822 Contact with and (suspected) exposure to covid-19: Secondary | ICD-10-CM | POA: Diagnosis present

## 2020-01-23 DIAGNOSIS — Z6841 Body Mass Index (BMI) 40.0 and over, adult: Secondary | ICD-10-CM

## 2020-01-23 DIAGNOSIS — J441 Chronic obstructive pulmonary disease with (acute) exacerbation: Secondary | ICD-10-CM | POA: Diagnosis present

## 2020-01-23 DIAGNOSIS — J9601 Acute respiratory failure with hypoxia: Secondary | ICD-10-CM | POA: Diagnosis not present

## 2020-01-23 DIAGNOSIS — Z8546 Personal history of malignant neoplasm of prostate: Secondary | ICD-10-CM

## 2020-01-23 DIAGNOSIS — G4733 Obstructive sleep apnea (adult) (pediatric): Secondary | ICD-10-CM | POA: Diagnosis present

## 2020-01-23 DIAGNOSIS — Z79899 Other long term (current) drug therapy: Secondary | ICD-10-CM

## 2020-01-23 DIAGNOSIS — Z8249 Family history of ischemic heart disease and other diseases of the circulatory system: Secondary | ICD-10-CM

## 2020-01-23 DIAGNOSIS — I10 Essential (primary) hypertension: Secondary | ICD-10-CM | POA: Diagnosis present

## 2020-01-23 DIAGNOSIS — Z79890 Hormone replacement therapy (postmenopausal): Secondary | ICD-10-CM

## 2020-01-23 LAB — CBC WITH DIFFERENTIAL/PLATELET
Abs Immature Granulocytes: 0.03 10*3/uL (ref 0.00–0.07)
Basophils Absolute: 0.1 10*3/uL (ref 0.0–0.1)
Basophils Relative: 1 %
Eosinophils Absolute: 1.4 10*3/uL — ABNORMAL HIGH (ref 0.0–0.5)
Eosinophils Relative: 14 %
HCT: 43.5 % (ref 39.0–52.0)
Hemoglobin: 14.3 g/dL (ref 13.0–17.0)
Immature Granulocytes: 0 %
Lymphocytes Relative: 14 %
Lymphs Abs: 1.4 10*3/uL (ref 0.7–4.0)
MCH: 29.4 pg (ref 26.0–34.0)
MCHC: 32.9 g/dL (ref 30.0–36.0)
MCV: 89.3 fL (ref 80.0–100.0)
Monocytes Absolute: 1 10*3/uL (ref 0.1–1.0)
Monocytes Relative: 10 %
Neutro Abs: 6.2 10*3/uL (ref 1.7–7.7)
Neutrophils Relative %: 61 %
Platelets: 301 10*3/uL (ref 150–400)
RBC: 4.87 MIL/uL (ref 4.22–5.81)
RDW: 13.3 % (ref 11.5–15.5)
WBC: 10.1 10*3/uL (ref 4.0–10.5)
nRBC: 0 % (ref 0.0–0.2)

## 2020-01-23 LAB — BASIC METABOLIC PANEL
Anion gap: 11 (ref 5–15)
BUN: 16 mg/dL (ref 6–20)
CO2: 27 mmol/L (ref 22–32)
Calcium: 9.3 mg/dL (ref 8.9–10.3)
Chloride: 99 mmol/L (ref 98–111)
Creatinine, Ser: 1.77 mg/dL — ABNORMAL HIGH (ref 0.61–1.24)
GFR, Estimated: 44 mL/min — ABNORMAL LOW (ref >60)
Glucose, Bld: 103 mg/dL — ABNORMAL HIGH (ref 70–99)
Potassium: 3.5 mmol/L (ref 3.5–5.1)
Sodium: 137 mmol/L (ref 135–145)

## 2020-01-23 LAB — RESP PANEL BY RT-PCR (FLU A&B, COVID) ARPGX2
Influenza A by PCR: NEGATIVE
Influenza B by PCR: NEGATIVE
SARS Coronavirus 2 by RT PCR: NEGATIVE

## 2020-01-23 LAB — PHOSPHORUS: Phosphorus: 4 mg/dL (ref 2.5–4.6)

## 2020-01-23 LAB — MAGNESIUM: Magnesium: 2.1 mg/dL (ref 1.7–2.4)

## 2020-01-23 MED ORDER — IPRATROPIUM BROMIDE 0.02 % IN SOLN: 2.5000 mL | Freq: Once | RESPIRATORY_TRACT | Status: DC

## 2020-01-23 MED ORDER — DOXYCYCLINE HYCLATE 100 MG PO TABS
100.0000 mg | ORAL_TABLET | Freq: Two times a day (BID) | ORAL | Status: DC
  Administered 2020-01-23 – 2020-01-27 (×8): 100 mg via ORAL
  Filled 2020-01-23 (×8): qty 1

## 2020-01-23 MED ORDER — IPRATROPIUM-ALBUTEROL 0.5-2.5 (3) MG/3ML IN SOLN
3.0000 mL | Freq: Once | RESPIRATORY_TRACT | Status: AC
  Administered 2020-01-23: 3 mL via RESPIRATORY_TRACT
  Filled 2020-01-23: qty 3

## 2020-01-23 MED ORDER — METHYLPREDNISOLONE SODIUM SUCC 125 MG IJ SOLR
125.0000 mg | Freq: Once | INTRAMUSCULAR | Status: AC
  Administered 2020-01-23: 125 mg via INTRAVENOUS
  Filled 2020-01-23: qty 2

## 2020-01-23 MED ORDER — ALBUTEROL SULFATE (2.5 MG/3ML) 0.083% IN NEBU: 2.5000 mg | INHALATION_SOLUTION | RESPIRATORY_TRACT | Status: DC | PRN

## 2020-01-23 MED ORDER — ACETAMINOPHEN 650 MG RE SUPP: 650.0000 mg | Freq: Four times a day (QID) | RECTAL | Status: DC | PRN

## 2020-01-23 MED ORDER — MONTELUKAST SODIUM 10 MG PO TABS
10.0000 mg | ORAL_TABLET | Freq: Every day | ORAL | Status: DC
  Administered 2020-01-23 – 2020-01-26 (×4): 10 mg via ORAL
  Filled 2020-01-23 (×4): qty 1

## 2020-01-23 MED ORDER — TORSEMIDE 20 MG PO TABS: 20.0000 mg | ORAL_TABLET | Freq: Every day | ORAL | Status: DC

## 2020-01-23 MED ORDER — SODIUM CHLORIDE 0.45 % IV SOLN: INTRAVENOUS | Status: DC

## 2020-01-23 MED ORDER — ALBUTEROL SULFATE HFA 108 (90 BASE) MCG/ACT IN AERS
6.0000 | INHALATION_SPRAY | Freq: Once | RESPIRATORY_TRACT | Status: AC
  Administered 2020-01-23: 6 via RESPIRATORY_TRACT
  Filled 2020-01-23: qty 6.7

## 2020-01-23 MED ORDER — PREDNISONE 20 MG PO TABS
40.0000 mg | ORAL_TABLET | Freq: Every day | ORAL | Status: DC
  Administered 2020-01-25: 40 mg via ORAL
  Filled 2020-01-23: qty 2

## 2020-01-23 MED ORDER — POTASSIUM CHLORIDE CRYS ER 20 MEQ PO TBCR
40.0000 meq | EXTENDED_RELEASE_TABLET | Freq: Once | ORAL | Status: AC
  Administered 2020-01-23: 40 meq via ORAL
  Filled 2020-01-23: qty 2

## 2020-01-23 MED ORDER — IPRATROPIUM BROMIDE 0.02 % IN SOLN: 0.5000 mg | Freq: Four times a day (QID) | RESPIRATORY_TRACT | Status: DC

## 2020-01-23 MED ORDER — METHYLPREDNISOLONE SODIUM SUCC 40 MG IJ SOLR
40.0000 mg | Freq: Four times a day (QID) | INTRAMUSCULAR | Status: AC
  Administered 2020-01-24 (×3): 40 mg via INTRAVENOUS
  Filled 2020-01-23 (×3): qty 1

## 2020-01-23 MED ORDER — ONDANSETRON HCL 4 MG PO TABS: 4.0000 mg | ORAL_TABLET | Freq: Four times a day (QID) | ORAL | Status: DC | PRN

## 2020-01-23 MED ORDER — ENOXAPARIN SODIUM 40 MG/0.4ML ~~LOC~~ SOLN
40.0000 mg | SUBCUTANEOUS | Status: DC
  Filled 2020-01-23: qty 0.4

## 2020-01-23 MED ORDER — ONDANSETRON HCL 4 MG/2ML IJ SOLN: 4.0000 mg | Freq: Four times a day (QID) | INTRAMUSCULAR | Status: DC | PRN

## 2020-01-23 MED ORDER — IPRATROPIUM-ALBUTEROL 0.5-2.5 (3) MG/3ML IN SOLN
3.0000 mL | Freq: Four times a day (QID) | RESPIRATORY_TRACT | Status: DC
  Administered 2020-01-24 – 2020-01-25 (×7): 3 mL via RESPIRATORY_TRACT
  Filled 2020-01-23 (×8): qty 3

## 2020-01-23 MED ORDER — ACETAMINOPHEN 325 MG PO TABS: 650.0000 mg | ORAL_TABLET | Freq: Four times a day (QID) | ORAL | Status: DC | PRN

## 2020-01-23 MED ORDER — METOPROLOL TARTRATE 25 MG PO TABS
25.0000 mg | ORAL_TABLET | Freq: Two times a day (BID) | ORAL | Status: DC
  Administered 2020-01-23 – 2020-01-27 (×8): 25 mg via ORAL
  Filled 2020-01-23 (×8): qty 1

## 2020-01-23 MED ORDER — ALBUTEROL (5 MG/ML) CONTINUOUS INHALATION SOLN
10.0000 mg/h | INHALATION_SOLUTION | Freq: Once | RESPIRATORY_TRACT | Status: AC
  Administered 2020-01-23: 10 mg/h via RESPIRATORY_TRACT
  Filled 2020-01-23: qty 20

## 2020-01-23 MED ORDER — MAGNESIUM SULFATE 2 GM/50ML IV SOLN
2.0000 g | Freq: Once | INTRAVENOUS | Status: AC
  Administered 2020-01-23: 2 g via INTRAVENOUS
  Filled 2020-01-23: qty 50

## 2020-01-23 MED ORDER — AMLODIPINE BESYLATE 5 MG PO TABS
5.0000 mg | ORAL_TABLET | Freq: Every day | ORAL | Status: DC
  Administered 2020-01-24: 5 mg via ORAL
  Filled 2020-01-23: qty 1

## 2020-01-23 MED ORDER — ALBUTEROL SULFATE (2.5 MG/3ML) 0.083% IN NEBU: 2.5000 mg | INHALATION_SOLUTION | Freq: Four times a day (QID) | RESPIRATORY_TRACT | Status: DC

## 2020-01-23 NOTE — ED Triage Notes (Signed)
Pt c/o SOB that started Friday, worsening Saturday. Pt has used inhalers and nebulizers at home with some relief. Pt reports SOB is worse with exertion.

## 2020-01-23 NOTE — ED Notes (Signed)
Pt ambulated w/o O2. Pt sats dropped to 84% on RA. Pt became increasingly SOB w/ exertion. Pt placed back in bed and on 3 L of O2. sats returned to 96%. PA made aware.

## 2020-01-23 NOTE — ED Provider Notes (Signed)
[facility] EMERGENCY DEPARTMENT Provider Note   CSN: [id number] Arrival date & time: 01/23/20  1437     History Chief Complaint  Patient presents with  . Shortness of Breath    Thomas [name] is a 59 y.o. male.  HPI      Thomas [name] is a 59 y.o. male, with a history of COPD, obesity, GERD, presenting to the ED with shortness of breath worsening over the last couple days. Patient states he suspects COPD exacerbation. He has been using his home albuterol inhalers and nebulizers with only temporary improvement.  He tried to contact his PCP this past Friday, but was unable to get a hold of them. He does not wear oxygen at home.  States his typical SPO2 is around 95% on room air. He does have Covid vaccination. Denies fever/chills, acute cough, chest pain, abdominal pain, lower extremity edema, N/V/D, or any other complaints.   Past Medical History:  Diagnosis Date  . Arthritis    "touch in my neck" (09/14/12)  . COPD (chronic obstructive pulmonary disease) ([place])   . GERD (gastroesophageal reflux disease)   . Hypertension   . Kidney stones    "passed on their own" (14-Sep-2012)  . Lateral meniscus tear   . Medial meniscus tear   . Obesity, Class III, BMI 40-49.9 (morbid obesity) ([place]) 05/05/2019  . OSA (obstructive sleep apnea)    "cannot sleep w/mask" (September 14, 2012)  . Prostate cancer ([place])   . Shortness of breath    "can happen at anytime" (2012-09-14)  . Thyroid nodule    bilaterally/notes 14-Sep-2012    Patient Active Problem List   Diagnosis Date Noted  . Obesity, Class III, BMI 40-49.9 (morbid obesity) ([place]) 05/05/2019  . COPD exacerbation ([place]) 05/04/2019  . Hypoxia   . Leg edema   . COPD with acute exacerbation ([place]) 09/16/2018  . CKD (chronic kidney disease), stage III ([place]) 09/16/2018  . Acute respiratory failure with hypoxia ([place]) 09/16/2018  . Allergic rhinitis Sep 15, 2018  . ARF (acute renal failure) ([place]) 10/17/2015  . Hypertension   . COPD  (chronic obstructive pulmonary disease) ([place])   . GERD (gastroesophageal reflux disease)   . Arthritis   . Kidney stones   . Prostate cancer ([place])   . Thyroid nodule   . OSA (obstructive sleep apnea)   . Medial meniscus tear   . Lateral meniscus tear   . Sleep apnea 11/29/2009  . HYPERSOMNIA 09/26/2009  . DYSPNEA 09/26/2009  . PROSTATE CANCER, HX OF 09/26/2009  . Essential hypertension 09/25/2009    Past Surgical History:  Procedure Laterality Date  . BACK SURGERY  1990s  . KNEE ARTHROSCOPY WITH MEDIAL MENISECTOMY Right 07/13/2013   Procedure: RIGHT KNEE ARTHROSCOPY WITH MEDIAL AND LATERAL MENISCECTOMY;  Surgeon: Lorn Junes, MD;  Location: [facility];  Service: Orthopedics;  Laterality: Right;  . LUMBAR DISC SURGERY  1990's   "had 2 ruptured discs" (2012/09/14)  . NASAL SINUS SURGERY  01/2009  . ROBOT ASSISTED LAPAROSCOPIC RADICAL PROSTATECTOMY  2006  . THYROIDECTOMY Left 09/14/12   Procedure: LEFT PARTIAL THYROIDECTOMY;  Surgeon: Ascencion Dike, MD;  Location: [facility];  Service: ENT;  Laterality: Left;  . THYROIDECTOMY, PARTIAL Left 09/14/2012       Family History  Problem Relation Age of Onset  . Aneurysm Mother   . Heart attack Father     Social History   Tobacco Use  . Smoking status: Former Smoker    Packs/day: 2.50  Years: 15.00    Pack years: 37.50    Types: Cigarettes    Quit date: 03/03/1993    Years since quitting: 26.9  . Smokeless tobacco: Never Used  Vaping Use  . Vaping Use: Never used  Substance Use Topics  . Alcohol use: Not Currently    Comment: 08/25/2012 "2 beers/day average". Pt reports no beer in over a year as of 01/23/20  . Drug use: No    Home Medications Prior to Admission medications   Medication Sig Start Date End Date Taking? Authorizing Provider  Albuterol Sulfate 108 (90 Base) MCG/ACT AEPB Inhale 2 puffs into the lungs every 6 (six) hours as needed (sob, wheeze). 09/21/18   Orson Eva, MD  amLODipine (NORVASC) 5 MG  tablet Take 1 tablet (5 mg total) by mouth daily. 09/21/18   Orson Eva, MD  budesonide-formoterol (SYMBICORT) 160-4.5 MCG/ACT inhaler Inhale 2 puffs into the lungs 2 (two) times daily. 10/27/18   Collene Gobble, MD  ipratropium-albuterol (DUONEB) 0.5-2.5 (3) MG/3ML SOLN Take 3 mLs by nebulization 4 (four) times daily. 05/08/19   Kathie Dike, MD  metoprolol tartrate (LOPRESSOR) 25 MG tablet Take 1 tablet (25 mg total) by mouth 2 (two) times daily. 09/21/18   Orson Eva, MD  montelukast (SINGULAIR) 10 MG tablet Take 10 mg by mouth at bedtime. 04/05/19   [provider]  predniSONE (DELTASONE) 20 MG tablet Take 40mg  po daily for 2 days then 30mg  daily for 2 days then 20mg  daily for 2 days then 10mg  daily for 2 days then stop 05/08/19   Kathie Dike, MD  torsemide (DEMADEX) 20 MG tablet Take 20 mg by mouth daily.    [provider]    Allergies    Patient has no known allergies.  Review of Systems   Review of Systems  Constitutional: Negative for chills, diaphoresis and fever.  Respiratory: Positive for shortness of breath. Negative for cough.   Cardiovascular: Negative for chest pain and leg swelling.  Gastrointestinal: Negative for abdominal pain, diarrhea, nausea and vomiting.  Neurological: Negative for syncope.  All other systems reviewed and are negative.   Physical Exam Updated Vital Signs BP 120/80   Pulse 93   Temp 98.1 F (36.7 C) (Oral)   Resp 19   Ht 5\' 9"  (1.753 m)   Wt 131.5 kg   SpO2 94%   BMI 42.83 kg/m   Physical Exam Vitals and nursing note reviewed.  Constitutional:      General: He is not in acute distress.    Appearance: He is well-developed. He is not diaphoretic.  HENT:     Head: Normocephalic and atraumatic.     Mouth/Throat:     Mouth: Mucous membranes are moist.     Pharynx: Oropharynx is clear.  Eyes:     Conjunctiva/sclera: Conjunctivae normal.  Cardiovascular:     Rate and Rhythm: Normal rate and regular rhythm.     Pulses:  Normal pulses.          Radial pulses are 2+ on the right side and 2+ on the left side.       Posterior tibial pulses are 2+ on the right side and 2+ on the left side.     Heart sounds: Normal heart sounds.     Comments: Tactile temperature in the extremities appropriate and equal bilaterally. Pulmonary:     Breath sounds: Wheezing present.     Comments: SPO2 92% on room air.  96% on 2 L supplemental O2. Abdominal:  Palpations: Abdomen is soft.     Tenderness: There is no abdominal tenderness. There is no guarding.  Musculoskeletal:     Cervical back: Neck supple.     Right lower leg: No edema.     Left lower leg: No edema.  Skin:    General: Skin is warm and dry.  Neurological:     Mental Status: He is alert.  Psychiatric:        Mood and Affect: Mood and affect normal.        Speech: Speech normal.        Behavior: Behavior normal.     ED Results / Procedures / Treatments   Labs (all labs ordered are listed, but only abnormal results are displayed) Labs Reviewed  BASIC METABOLIC PANEL - Abnormal; Notable for the following components:      Result Value   Glucose, Bld 103 (*)    Creatinine, Ser 1.77 (*)    GFR, Estimated 44 (*)    All other components within normal limits  CBC WITH DIFFERENTIAL/PLATELET - Abnormal; Notable for the following components:   Eosinophils Absolute 1.4 (*)    All other components within normal limits  RESP PANEL BY RT-PCR (FLU A&B, COVID) ARPGX2  MAGNESIUM  PHOSPHORUS    BUN  Date Value Ref Range Status  01/23/2020 16 6 - 20 mg/dL Final  05/07/2019 39 (H) 6 - 20 mg/dL Final  05/06/2019 34 (H) 6 - 20 mg/dL Final  05/05/2019 23 (H) 6 - 20 mg/dL Final   Creatinine, Ser  Date Value Ref Range Status  01/23/2020 1.77 (H) 0.61 - 1.24 mg/dL Final  05/07/2019 1.95 (H) 0.61 - 1.24 mg/dL Final  05/06/2019 2.01 (H) 0.61 - 1.24 mg/dL Final  05/05/2019 2.33 (H) 0.61 - 1.24 mg/dL Final     EKG EKG Interpretation  Date/Time:  Monday  January 23 2020 14:54:24 EST Ventricular Rate:  99 PR Interval:    QRS Duration: 96 QT Interval:  367 QTC Calculation: 471 R Axis:   30 Text Interpretation: Sinus rhythm Consider left atrial enlargement Confirmed by Fredia Sorrow [phone]) on 01/23/2020 5:22:00 PM   Radiology DG Chest Portable 1 View  Result Date: 01/23/2020 CLINICAL DATA:  Shortness of breath. EXAM: PORTABLE CHEST 1 VIEW COMPARISON:  May 04, 2019 FINDINGS: The heart size and mediastinal contours are within normal limits. Both lungs are clear. The visualized skeletal structures are unremarkable. IMPRESSION: No active disease. Electronically Signed   By: Virgina Norfolk M.D.   On: 01/23/2020 15:50    Procedures Procedures (including critical care time)  Medications Ordered in ED Medications  albuterol (PROVENTIL,VENTOLIN) solution continuous neb (has no administration in time range)  potassium chloride SA (KLOR-CON) CR tablet 40 mEq (has no administration in time range)  magnesium sulfate IVPB 2 g 50 mL (has no administration in time range)  methylPREDNISolone sodium succinate (SOLU-MEDROL) 125 mg/2 mL injection 125 mg (125 mg Intravenous Given 01/23/20 1553)  albuterol (VENTOLIN HFA) 108 (90 Base) MCG/ACT inhaler 6 puff (6 puffs Inhalation Given 01/23/20 1552)  ipratropium-albuterol (DUONEB) 0.5-2.5 (3) MG/3ML nebulizer solution 3 mL (3 mLs Nebulization Given 01/23/20 1719)    ED Course  I have reviewed the triage vital signs and the nursing notes.  Pertinent labs & imaging results that were available during my care of the patient were reviewed by me and considered in my medical decision making (see chart for details).  Clinical Course as of Jan 23 1939  [facility] Jan 23, 2020  1928  Spoke with Dr. Olevia Bowens, hospitalist. Agrees to admit the patient.    [SJ]    Clinical Course User Index [SJ] Malori Myers, Helane Gunther, PA-C   MDM Rules/Calculators/A&P                          Patient presents with shortness of breath,  mostly with exertion. Nontoxic-appearing, afebrile, not hypotensive. Suspected COPD exacerbation. I personally reviewed and interpreted the patient's labs and imaging studies. Despite multiple nebulizer treatments at home and treatments here in the ED, patient continues to complain of shortness of breath, especially with exertion. Hypoxic down to 84% on room air with ambulation. I think patient could benefit from admission for further management.  Findings and plan of care discussed with Fredia Sorrow, MD.   Vitals:   01/23/20 1745 01/23/20 1800 01/23/20 1830 01/23/20 1845  BP:  (!) 138/93 133/84   Pulse: 95 95 (!) 104   Resp: 16 18  20   Temp:      TempSrc:      SpO2: 92% 92% 93%   Weight:      Height:         Final Clinical Impression(s) / ED Diagnoses Final diagnoses:  COPD exacerbation [facility])    Rx / [place] Orders ED Discharge Orders    None       Layla Maw 01/23/20 1941    Fredia Sorrow, MD 02/07/20 1417

## 2020-01-23 NOTE — H&P (Signed)
History and Physical    AUGUST LONGEST OMV:[id number] DOB: Jul 05, 1960 DOA: 01/23/2020  PCP: Asencion Noble, MD   Patient coming from: Home.   I have personally briefly reviewed patient's old medical records in [place]  Chief Complaint: Shortness of breath.  HPI: Thomas [name] is a 60 y.o. male with medical history significant of osteoarthritis, COPD, GERD, hypertension, urolithiasis, lateral meniscus and medial meniscus tear of right knee, class III obesity, OSA not on CPAP, prostate cancer, thyroid nodule who is coming to the emergency department due to progressively worse shortness of breath since Friday.  He denies fever, rhinorrhea, sore throat, but states he has been coughing, which is occasionally productive and occasionally produces pleuritic chest pain.  Dyspnea has been affecting his sleep hours.  No changes in appetite.  He denies dizziness, diaphoresis, palpitations, exertional chest pain, but states he has been having swelling of the lower extremities over the past month.  He denies abdominal pain, nausea, vomiting, diarrhea, constipation, melena or hematochezia.  No dysuria, frequency or hematuria.  Denies polyuria, polydipsia or blurred vision.  ED Course: Initial vital signs were temperature 98.1 F, pulse 101, respirations 22, BP 152/101 mmHg O2 sat 92% on room air.  The patient received 6 puffs of albuterol MDI, a DuoNeb and 125 mg of Solu-Medrol IVPB.  I added K-Dur 40 mEq p.o. x1 dose and 2 g of magnesium sulfate IVPB.  Work-up: CBC was normal.  Coronavirus 2 influenza PCR was negative.  BMP shows normal BUN and electrolytes.  His glucose was 103 and creatinine 1.77 mg/dL.  Chest radiograph does not have any acute cardiopulmonary pathology.  Cardiomediastinal contours were normal.  Review of Systems: As per HPI otherwise all other systems reviewed and are negative.  Past Medical History:  Diagnosis Date  . Arthritis    "touch in my neck" (2012-09-11)  . COPD  (chronic obstructive pulmonary disease) ([place])   . GERD (gastroesophageal reflux disease)   . Hypertension   . Kidney stones    "passed on their own" (Sep 11, 2012)  . Lateral meniscus tear   . Medial meniscus tear   . Obesity, Class III, BMI 40-49.9 (morbid obesity) ([place]) 05/05/2019  . OSA (obstructive sleep apnea)    "cannot sleep w/mask" (11-Sep-2012)  . Prostate cancer ([place])   . Shortness of breath    "can happen at anytime" (September 11, 2012)  . Thyroid nodule    bilaterally/notes Sep 11, 2012   Past Surgical History:  Procedure Laterality Date  . BACK SURGERY  1990s  . KNEE ARTHROSCOPY WITH MEDIAL MENISECTOMY Right 07/13/2013   Procedure: RIGHT KNEE ARTHROSCOPY WITH MEDIAL AND LATERAL MENISCECTOMY;  Surgeon: Lorn Junes, MD;  Location: [facility];  Service: Orthopedics;  Laterality: Right;  . LUMBAR DISC SURGERY  1990's   "had 2 ruptured discs" (09-11-2012)  . NASAL SINUS SURGERY  01/2009  . ROBOT ASSISTED LAPAROSCOPIC RADICAL PROSTATECTOMY  2006  . THYROIDECTOMY Left 09/11/2012   Procedure: LEFT PARTIAL THYROIDECTOMY;  Surgeon: Ascencion Dike, MD;  Location: [facility];  Service: ENT;  Laterality: Left;  . THYROIDECTOMY, PARTIAL Left 09/11/12   Social History  reports that he quit smoking about 26 years ago. His smoking use included cigarettes. He has a 37.50 pack-year smoking history. He has never used smokeless tobacco. He reports previous alcohol use. He reports that he does not use drugs.  No Known Allergies  Family History  Problem Relation Age of Onset  . Aneurysm Mother   . Heart attack Father  Prior to Admission medications   Medication Sig Start Date End Date Taking? Authorizing Provider  Albuterol Sulfate 108 (90 Base) MCG/ACT AEPB Inhale 2 puffs into the lungs every 6 (six) hours as needed (sob, wheeze). 09/21/18   Orson Eva, MD  amLODipine (NORVASC) 5 MG tablet Take 1 tablet (5 mg total) by mouth daily. 09/21/18   Orson Eva, MD  budesonide-formoterol (SYMBICORT)  160-4.5 MCG/ACT inhaler Inhale 2 puffs into the lungs 2 (two) times daily. 10/27/18   Collene Gobble, MD  ipratropium-albuterol (DUONEB) 0.5-2.5 (3) MG/3ML SOLN Take 3 mLs by nebulization 4 (four) times daily. 05/08/19   Kathie Dike, MD  metoprolol tartrate (LOPRESSOR) 25 MG tablet Take 1 tablet (25 mg total) by mouth 2 (two) times daily. 09/21/18   Orson Eva, MD  montelukast (SINGULAIR) 10 MG tablet Take 10 mg by mouth at bedtime. 04/05/19   [provider]  predniSONE (DELTASONE) 20 MG tablet Take 40mg  po daily for 2 days then 30mg  daily for 2 days then 20mg  daily for 2 days then 10mg  daily for 2 days then stop 05/08/19   Kathie Dike, MD  torsemide (DEMADEX) 20 MG tablet Take 20 mg by mouth daily.    [provider]   Physical Exam: Vitals:   01/23/20 1745 01/23/20 1800 01/23/20 1830 01/23/20 1845  BP:  (!) 138/93 133/84   Pulse: 95 95 (!) 104   Resp: 16 18  20   Temp:      TempSrc:      SpO2: 92% 92% 93%   Weight:      Height:       Constitutional: NAD, calm, comfortable Eyes: PERRL, lids and conjunctivae normal ENMT: Mucous membranes are dry. Posterior pharynx clear of any exudate or lesions. Neck: normal, supple, no masses, no thyromegaly Respiratory: Decreased breath sounds with bilateral rhonchi and wheezing.  No crackles.  No accessory muscle use.  Cardiovascular: Regular rate and rhythm, no murmurs / rubs / gallops.  Stage I lymphedema more pronounced on the RLE. 2+ pedal pulses. No carotid bruits.  Abdomen: Obese.  Bowel sounds positive.  Soft, no tenderness, no masses palpated. No hepatosplenomegaly. Musculoskeletal: no clubbing / cyanosis. Good ROM, no contractures. Normal muscle tone.  Skin: Small skin tags facial area. Neurologic: CN 2-12 grossly intact. Sensation intact, DTR normal. Strength 5/5 in all 4.  Psychiatric: Normal judgment and insight. Alert and oriented x 3. Normal mood.   Labs on Admission: I have personally reviewed following labs and  imaging studies  CBC: Recent Labs  Lab 01/23/20 1536  WBC 10.1  NEUTROABS 6.2  HGB 14.3  HCT 43.5  MCV 89.3  PLT 825   Basic Metabolic Panel: Recent Labs  Lab 01/23/20 1536  NA 137  K 3.5  CL 99  CO2 27  GLUCOSE 103*  BUN 16  CREATININE 1.77*  CALCIUM 9.3   GFR: Estimated Creatinine Clearance: 60.4 mL/min (A) (by C-G formula based on SCr of 1.77 mg/dL (H)).  Liver Function Tests: No results for input(s): AST, ALT, ALKPHOS, BILITOT, PROT, ALBUMIN in the last 168 hours.  Radiological Exams on Admission: DG Chest Portable 1 View  Result Date: 01/23/2020 CLINICAL DATA:  Shortness of breath. EXAM: PORTABLE CHEST 1 VIEW COMPARISON:  May 04, 2019 FINDINGS: The heart size and mediastinal contours are within normal limits. Both lungs are clear. The visualized skeletal structures are unremarkable. IMPRESSION: No active disease. Electronically Signed   By: Virgina Norfolk M.D.   On: 01/23/2020 15:50   08/13/2018  echocardiogram IMPRESSIONS: 1. The left ventricle has normal systolic function, with an ejection  fraction of 55-60%. The cavity size was normal. There is mild concentric  left ventricular hypertrophy. Left ventricular diastolic parameters were  normal.  2. The right ventricle has normal systolic function. The cavity was  normal. There is no increase in right ventricular wall thickness.  3. The mitral valve is grossly normal.  4. The tricuspid valve is grossly normal.  5. The aortic valve is tricuspid.  6. The aortic root is normal in size and structure.   EKG: Independently reviewed.  Vent. rate 99 BPM PR interval * ms QRS duration 96 ms QT/QTc 367/471 ms P-R-T axes 74 30 83 Sinus rhythm Consider left atrial enlargement  Assessment/Plan Principal Problem:   Acute respiratory failure with hypoxia (HCC)\   COPD with acute exacerbation (HCC) Observation/telemetry. Continue supplemental oxygen. Scheduled and as needed bronchodilators. Flutter valve  and incentive spirometry. Continue methylprednisolone 40 mg IVP every 6 hours. Prednisone taper after methylprednisolone course. Needs to resume Symbicort to avoid exacerbations.  Active Problems:   Essential hypertension Continue amlodipine 10 mg p.o. daily. Continue metoprolol 25 mg p.o. twice daily. Continue torsemide 20 mg p.o. daily. Monitor BP, HR, renal function electrolytes. Weight loss advised.    OSA (obstructive sleep apnea) Currently not using CPAP. Mask is uncomfortable per patient. Declined to use it in the hospital. Advised about cardiovascular risks of OSA. Advised to lose weight to improve condition.    CKD (chronic kidney disease), stage 3b (HCC) Improved over baseline. Monitor renal function electrolytes.    Obesity, Class III, BMI 40-49.9 (morbid obesity) ([place]) Discussed need for significant lifestyle modifications. Follow-up with primary care provider.    GERD (gastroesophageal reflux disease) PPI or H2 blocker as needed.    Hyperglycemia (nonfasting) High risk for DM development. Hydrate overnight while on glucocorticoids. Recheck glucose level in the morning when fasting. Begin CBG monitoring if hyperglycemic while on steroids. Follow-up with PCP for metabolic syndrome management..    DVT prophylaxis: Lovenox SQ. Code Status:   Full code. Family Communication:   Disposition Plan:   Patient is from:  Home.  Anticipated DC to:  Home.  Anticipated DC date:  01/24/2020.  Anticipated DC barriers: Clinical improvement.  Consults called: Admission status:  Observation/telemetry.  Severity of Illness:  High due to persistent COPD exacerbation despite acute treatment in the ED following failed outpatient management with oral prednisone and doxycycline.  Reubin Milan MD [facility]  How to contact the [facility] Attending or Consulting provider [place] or covering provider during after hours [place], for this patient?   1. Check the care  team in [facility] and look for a) attending/consulting [facility] provider listed and b) the [facility] team listed 2. Log into [email or web address] and use [place]'s universal password to access. If you do not have the password, please contact the hospital operator. 3. Locate the [facility] provider you are looking for under [facility] and page to a number that you can be directly reached. 4. If you still have difficulty reaching the provider, please page the [facility] (Director on Call) for the Hospitalists listed on amion for assistance.  01/23/2020, 7:33 PM   This document was prepared using Dragon voice recognition software and may contain some unintended transcription errors.

## 2020-01-24 ENCOUNTER — Observation Stay (HOSPITAL_COMMUNITY): Payer: 59

## 2020-01-24 DIAGNOSIS — R7303 Prediabetes: Secondary | ICD-10-CM | POA: Diagnosis present

## 2020-01-24 DIAGNOSIS — Z8249 Family history of ischemic heart disease and other diseases of the circulatory system: Secondary | ICD-10-CM | POA: Diagnosis not present

## 2020-01-24 DIAGNOSIS — I10 Essential (primary) hypertension: Secondary | ICD-10-CM | POA: Diagnosis not present

## 2020-01-24 DIAGNOSIS — N1832 Chronic kidney disease, stage 3b: Secondary | ICD-10-CM | POA: Diagnosis present

## 2020-01-24 DIAGNOSIS — G4733 Obstructive sleep apnea (adult) (pediatric): Secondary | ICD-10-CM | POA: Diagnosis present

## 2020-01-24 DIAGNOSIS — I313 Pericardial effusion (noninflammatory): Secondary | ICD-10-CM | POA: Diagnosis present

## 2020-01-24 DIAGNOSIS — K219 Gastro-esophageal reflux disease without esophagitis: Secondary | ICD-10-CM | POA: Diagnosis present

## 2020-01-24 DIAGNOSIS — I5033 Acute on chronic diastolic (congestive) heart failure: Secondary | ICD-10-CM | POA: Diagnosis present

## 2020-01-24 DIAGNOSIS — Z8546 Personal history of malignant neoplasm of prostate: Secondary | ICD-10-CM | POA: Diagnosis not present

## 2020-01-24 DIAGNOSIS — I130 Hypertensive heart and chronic kidney disease with heart failure and stage 1 through stage 4 chronic kidney disease, or unspecified chronic kidney disease: Secondary | ICD-10-CM | POA: Diagnosis present

## 2020-01-24 DIAGNOSIS — Z7951 Long term (current) use of inhaled steroids: Secondary | ICD-10-CM | POA: Diagnosis not present

## 2020-01-24 DIAGNOSIS — Z20822 Contact with and (suspected) exposure to covid-19: Secondary | ICD-10-CM | POA: Diagnosis present

## 2020-01-24 DIAGNOSIS — N1831 Chronic kidney disease, stage 3a: Secondary | ICD-10-CM | POA: Diagnosis not present

## 2020-01-24 DIAGNOSIS — Z87891 Personal history of nicotine dependence: Secondary | ICD-10-CM | POA: Diagnosis not present

## 2020-01-24 DIAGNOSIS — J9601 Acute respiratory failure with hypoxia: Secondary | ICD-10-CM | POA: Diagnosis present

## 2020-01-24 DIAGNOSIS — Z79890 Hormone replacement therapy (postmenopausal): Secondary | ICD-10-CM | POA: Diagnosis not present

## 2020-01-24 DIAGNOSIS — N183 Chronic kidney disease, stage 3 unspecified: Secondary | ICD-10-CM | POA: Diagnosis not present

## 2020-01-24 DIAGNOSIS — Z6841 Body Mass Index (BMI) 40.0 and over, adult: Secondary | ICD-10-CM | POA: Diagnosis not present

## 2020-01-24 DIAGNOSIS — R600 Localized edema: Secondary | ICD-10-CM | POA: Diagnosis present

## 2020-01-24 DIAGNOSIS — Z79899 Other long term (current) drug therapy: Secondary | ICD-10-CM | POA: Diagnosis not present

## 2020-01-24 DIAGNOSIS — J441 Chronic obstructive pulmonary disease with (acute) exacerbation: Secondary | ICD-10-CM | POA: Diagnosis present

## 2020-01-24 DIAGNOSIS — I5031 Acute diastolic (congestive) heart failure: Secondary | ICD-10-CM | POA: Diagnosis not present

## 2020-01-24 LAB — HEMOGLOBIN A1C
Hgb A1c MFr Bld: 5.8 % — ABNORMAL HIGH (ref 4.8–5.6)
Mean Plasma Glucose: 119.76 mg/dL

## 2020-01-24 LAB — BASIC METABOLIC PANEL
Anion gap: 15 (ref 5–15)
BUN: 25 mg/dL — ABNORMAL HIGH (ref 6–20)
CO2: 25 mmol/L (ref 22–32)
Calcium: 9.8 mg/dL (ref 8.9–10.3)
Chloride: 97 mmol/L — ABNORMAL LOW (ref 98–111)
Creatinine, Ser: 2.1 mg/dL — ABNORMAL HIGH (ref 0.61–1.24)
GFR, Estimated: 36 mL/min — ABNORMAL LOW (ref >60)
Glucose, Bld: 149 mg/dL — ABNORMAL HIGH (ref 70–99)
Potassium: 4 mmol/L (ref 3.5–5.1)
Sodium: 137 mmol/L (ref 135–145)

## 2020-01-24 LAB — GLUCOSE, CAPILLARY: Glucose-Capillary: 115 mg/dL — ABNORMAL HIGH (ref 70–99)

## 2020-01-24 LAB — HIV ANTIBODY (ROUTINE TESTING W REFLEX): HIV Screen 4th Generation wRfx: NONREACTIVE

## 2020-01-24 LAB — BRAIN NATRIURETIC PEPTIDE: B Natriuretic Peptide: 782 pg/mL — ABNORMAL HIGH (ref 0.0–100.0)

## 2020-01-24 MED ORDER — FUROSEMIDE 10 MG/ML IJ SOLN
40.0000 mg | Freq: Two times a day (BID) | INTRAMUSCULAR | Status: DC
  Administered 2020-01-24 – 2020-01-27 (×7): 40 mg via INTRAVENOUS
  Filled 2020-01-24 (×7): qty 4

## 2020-01-24 MED ORDER — INSULIN ASPART 100 UNIT/ML ~~LOC~~ SOLN: 0.0000 [IU] | Freq: Every day | SUBCUTANEOUS | Status: DC

## 2020-01-24 MED ORDER — INSULIN ASPART 100 UNIT/ML ~~LOC~~ SOLN
0.0000 [IU] | Freq: Three times a day (TID) | SUBCUTANEOUS | Status: DC
  Administered 2020-01-25: 2 [IU] via SUBCUTANEOUS
  Administered 2020-01-26: 3 [IU] via SUBCUTANEOUS
  Administered 2020-01-27: 2 [IU] via SUBCUTANEOUS

## 2020-01-24 MED ORDER — ACETYLCYSTEINE 20 % IN SOLN
1.0000 mL | Freq: Two times a day (BID) | RESPIRATORY_TRACT | Status: DC
  Administered 2020-01-25: 1 mL via RESPIRATORY_TRACT
  Filled 2020-01-24: qty 4

## 2020-01-24 MED ORDER — HEPARIN SODIUM (PORCINE) 5000 UNIT/ML IJ SOLN
5000.0000 [IU] | Freq: Three times a day (TID) | INTRAMUSCULAR | Status: DC
  Filled 2020-01-24 (×4): qty 1

## 2020-01-24 NOTE — Progress Notes (Signed)
OT Cancellation Note  Patient Details Name: NAREG BREIGHNER MRN: [id number] DOB: 1961-01-21   Cancelled Treatment:    Reason Eval/Treat Not Completed: OT screened, no needs identified, will sign off. Pt screened for OT needs. At baseline pt is independent in ADLs and mobility and is primary caregiver for his wife who has dementia. Pt does not utilize O2 at home. Pt is performing ADLs with increased time for SOB between tasks. Prefers to sit at EOB for improved work of breathing. Pt is familiar with energy conservation strategies and utilizes as needed. No further OT services required at this time.   Guadelupe Sabin, OTR/L  [phone] 01/24/2020, 8:29 AM

## 2020-01-24 NOTE — Progress Notes (Signed)
BS 141 @ 2200, did not cross over.

## 2020-01-24 NOTE — Plan of Care (Signed)
°  Problem: Acute Rehab PT Goals(only PT should resolve) Goal: Pt Will Ambulate Outcome: Progressing Flowsheets (Taken 01/24/2020 1131) Pt will Ambulate:  100 feet  with modified independence Goal: Pt Will Go Up/Down Stairs Outcome: Progressing Flowsheets (Taken 01/24/2020 1131) Pt will Go Up / Down Stairs:  Flight  with modified independence Goal: PT Additional Goal #1 Outcome: Progressing Flowsheets (Taken 01/24/2020 1131) Additional Goal #1: Patient will demo safe and effective use of AD to improve ambulation tolerance maintaining saturation level >90% with exertion   11:33 AM, 01/24/20 M. Sherlyn Lees, PT, DPT Physical Therapist- [place] Office Number: [phone]

## 2020-01-24 NOTE — Progress Notes (Signed)
   01/24/20 1116  Assess: MEWS Score  Pulse Rate (!) 124 (w/ exertion)  Assess: MEWS Score  MEWS Temp 0  MEWS Systolic 0  MEWS Pulse 2  MEWS RR 0  MEWS LOC 0  MEWS Score 2  MEWS Score Color Yellow  Assess: if the MEWS score is Yellow or Red  Were vital signs taken at a resting state? No  Focused Assessment No change from prior assessment  Early Detection of Sepsis Score *See Row Information* Low  MEWS guidelines implemented *See Row Information* No, vital signs rechecked  Treat  MEWS Interventions Other (Comment)  Pain Scale 0-10 (rechecked vitals at rest)  Pain Score 0  Take Vital Signs  Increase Vital Sign Frequency  Yellow: Q 2hr X 2 then Q 4hr X 2, if remains yellow, continue Q 4hrs  Escalate  MEWS: Escalate Yellow: discuss with charge nurse/RN and consider discussing with provider and RRT  Notify: Charge Nurse/RN  Name of Charge Nurse/RN Notified [place]  Date Charge Nurse/RN Notified 01/24/20  Time Charge Nurse/RN Notified 1130  Notify: Provider  Provider Name/Title not notified  Document  Patient Outcome Other (Comment) (stable)

## 2020-01-24 NOTE — Progress Notes (Signed)
PROGRESS NOTE    Thomas [name]  GLO:[id number] DOB: 1961-01-22 DOA: 01/23/2020 PCP: Asencion Noble, MD   Brief Narrative:  Per HPI: Thomas [name] is a 59 y.o. male with medical history significant of osteoarthritis, COPD, GERD, hypertension, urolithiasis, lateral meniscus and medial meniscus tear of right knee, class III obesity, OSA not on CPAP, prostate cancer, thyroid nodule who is coming to the emergency department due to progressively worse shortness of breath since Friday.  He denies fever, rhinorrhea, sore throat, but states he has been coughing, which is occasionally productive and occasionally produces pleuritic chest pain.  Dyspnea has been affecting his sleep hours.  No changes in appetite.  He denies dizziness, diaphoresis, palpitations, exertional chest pain, but states he has been having swelling of the lower extremities over the past month.  He denies abdominal pain, nausea, vomiting, diarrhea, constipation, melena or hematochezia.  No dysuria, frequency or hematuria.  Denies polyuria, polydipsia or blurred vision.  -Patient has been admitted with acute hypoxemic respiratory failure that appears multifactorial in the setting of COPD exacerbation as well as likely volume overload with associated CHF.  He has been started on steroids and breathing treatments and is also noted to have some profound orthopnea and exertional dyspnea.  Assessment & Plan:   Principal Problem:   Acute respiratory failure with hypoxia (HCC) Active Problems:   Essential hypertension   GERD (gastroesophageal reflux disease)   OSA (obstructive sleep apnea)   COPD with acute exacerbation (HCC)   CKD (chronic kidney disease), stage III (HCC)   Obesity, Class III, BMI 40-49.9 (morbid obesity) ([place])   Acute hypoxemic respiratory failure (HCC)   Acute hypoxemic respiratory failure-multifactorial -Appears to be in setting of COPD exacerbation and possibly CHF -Continue on steroids as ordered -Continue  bronchodilators -Continue on doxycycline -Chest x-ray with no active disease -Patient requires some Mucomyst and antitussives -Wean as tolerated  Volume overload with suspected CHF exacerbation -2D echocardiogram will be ordered for further evaluation -BNP ordered and is noted to be 782 -Prior 2D echocardiogram 08/2018 with LVEF 55-60% and some LVH noted -Lasix IV 40 mg twice daily ordered -Holding home torsemide for now -Bilateral lower extremities with no signs of DVT -Check daily weights and ins and outs  Essential hypertension -Continue metoprolol and hold amlodipine for now with IV Lasix being administered  OSA -Patient refuses to wear CPAP mask as it is uncomfortable  Noted hyperglycemia -Likely in setting of steroids with possible diabetes -Plan to start SSI and check A1c  CKD stage IIIb -Baseline appears to be near 1.8-2 -Continue to monitor closely especially with diuresis  Morbid obesity -Requires significant lifestyle modifications  GERD -PPI  DVT prophylaxis: Heparin Code Status: Full code Family Communication: Patient states he will call Disposition Plan:  Status is: Inpatient  Remains inpatient appropriate because:IV treatments appropriate due to intensity of illness or inability to take PO and Inpatient level of care appropriate due to severity of illness   Dispo: The patient is from: Home              Anticipated d/c is to: Home              Anticipated d/c date is: 3 days              Patient currently is not medically stable to d/c.  Patient requires significant IV diuresis and treatments for CHF and COPD.  Consultants:   None  Procedures:   See below  Antimicrobials:  Anti-infectives (From  admission, onward)   Start     Dose/Rate Route Frequency Ordered Stop   01/23/20 2315  doxycycline (VIBRA-TABS) tablet 100 mg        100 mg Oral 2 times daily 01/23/20 2220         Subjective: Patient seen and evaluated today with ongoing  significant shortness of breath and wheezing as well as sputum production with cough.  Objective: Vitals:   01/24/20 1109 01/24/20 1116 01/24/20 1200 01/24/20 1323  BP: 134/82  125/72   Pulse: (!) 110 (!) 124 76   Resp:   20   Temp:   98.6 F (37 C)   TempSrc:      SpO2: (!) 87%  97% 92%  Weight:      Height:        Intake/Output Summary (Last 24 hours) at 01/24/2020 1404 Last data filed at 01/24/2020 1156 Gross per 24 hour  Intake 579.62 ml  Output 250 ml  Net 329.62 ml   Filed Weights   01/23/20 1443 01/24/20 0205  Weight: 131.5 kg 131.6 kg    Examination:  General exam: Appears calm and comfortable, obese with moderate respiratory distress noted Respiratory system: Rhonchi and wheezing throughout with crackles.  Currently on 3 L nasal cannula oxygen Cardiovascular system: S1 & S2 heard, RRR.  Gastrointestinal system: Abdomen is obese Central nervous system: Alert and awake Extremities: 2+ pitting edema noted bilaterally Skin: No rashes, lesions or ulcers Psychiatry: Judgement and insight appear normal. Mood & affect appropriate.     Data Reviewed: I have personally reviewed following labs and imaging studies  CBC: Recent Labs  Lab 01/23/20 1536  WBC 10.1  NEUTROABS 6.2  HGB 14.3  HCT 43.5  MCV 89.3  PLT 527   Basic Metabolic Panel: Recent Labs  Lab 01/23/20 1536 01/24/20 0447  NA 137 137  K 3.5 4.0  CL 99 97*  CO2 27 25  GLUCOSE 103* 149*  BUN 16 25*  CREATININE 1.77* 2.10*  CALCIUM 9.3 9.8  MG 2.1  --   PHOS 4.0  --    GFR: Estimated Creatinine Clearance: 50.9 mL/min (A) (by C-G formula based on SCr of 2.1 mg/dL (H)). Liver Function Tests: No results for input(s): AST, ALT, ALKPHOS, BILITOT, PROT, ALBUMIN in the last 168 hours. No results for input(s): LIPASE, AMYLASE in the last 168 hours. No results for input(s): AMMONIA in the last 168 hours. Coagulation Profile: No results for input(s): INR, PROTIME in the last 168 hours. Cardiac  Enzymes: No results for input(s): CKTOTAL, CKMB, CKMBINDEX, TROPONINI in the last 168 hours. BNP (last 3 results) No results for input(s): PROBNP in the last 8760 hours. HbA1C: No results for input(s): HGBA1C in the last 72 hours. CBG: No results for input(s): GLUCAP in the last 168 hours. Lipid Profile: No results for input(s): CHOL, HDL, LDLCALC, TRIG, CHOLHDL, LDLDIRECT in the last 72 hours. Thyroid Function Tests: No results for input(s): TSH, T4TOTAL, FREET4, T3FREE, THYROIDAB in the last 72 hours. Anemia Panel: No results for input(s): VITAMINB12, FOLATE, FERRITIN, TIBC, IRON, RETICCTPCT in the last 72 hours. Sepsis Labs: No results for input(s): PROCALCITON, LATICACIDVEN in the last 168 hours.  Recent Results (from the past 240 hour(s))  Resp Panel by RT-PCR (Flu A&B, Covid) Nasopharyngeal Swab     Status: None   Collection Time: 01/23/20  3:09 PM   Specimen: Nasopharyngeal Swab; Nasopharyngeal(NP) swabs in vial transport medium  Result Value Ref Range Status   SARS Coronavirus 2 by RT  PCR NEGATIVE NEGATIVE Final    Comment: (NOTE) SARS-CoV-2 target nucleic acids are NOT DETECTED.  The SARS-CoV-2 RNA is generally detectable in upper respiratory specimens during the acute phase of infection. The lowest concentration of SARS-CoV-2 viral copies this assay can detect is 138 copies/mL. A negative result does not preclude SARS-Cov-2 infection and should not be used as the sole basis for treatment or other patient management decisions. A negative result may occur with  improper specimen collection/handling, submission of specimen other than nasopharyngeal swab, presence of viral mutation(s) within the areas targeted by this assay, and inadequate number of viral copies(<138 copies/mL). A negative result must be combined with clinical observations, patient history, and epidemiological information. The expected result is Negative.  Fact Sheet for Patients:    EntrepreneurPulse.com.au  Fact Sheet for Healthcare Providers:  IncredibleEmployment.be  This test is no t yet approved or cleared by the [place] FDA and  has been authorized for detection and/or diagnosis of SARS-CoV-2 by FDA under an Emergency Use Authorization (EUA). This EUA will remain  in effect (meaning this test can be used) for the duration of the COVID-19 declaration under Section 564(b)(1) of the Act, 21 U.S.C.section 360bbb-3(b)(1), unless the authorization is terminated  or revoked sooner.       Influenza A by PCR NEGATIVE NEGATIVE Final   Influenza B by PCR NEGATIVE NEGATIVE Final    Comment: (NOTE) The Xpert Xpress SARS-CoV-2/FLU/RSV plus assay is intended as an aid in the diagnosis of influenza from Nasopharyngeal swab specimens and should not be used as a sole basis for treatment. Nasal washings and aspirates are unacceptable for Xpert Xpress SARS-CoV-2/FLU/RSV testing.  Fact Sheet for Patients: EntrepreneurPulse.com.au  Fact Sheet for Healthcare Providers: IncredibleEmployment.be  This test is not yet approved or cleared by the [place] FDA and has been authorized for detection and/or diagnosis of SARS-CoV-2 by FDA under an Emergency Use Authorization (EUA). This EUA will remain in effect (meaning this test can be used) for the duration of the COVID-19 declaration under Section 564(b)(1) of the Act, 21 U.S.C. section 360bbb-3(b)(1), unless the authorization is terminated or revoked.  Performed at [facility], [address], [place], [place] [address]          Radiology Studies: US Venous Img Lower Bilateral (DVT)  Result Date: 01/24/2020 CLINICAL DATA:  Bilateral lower extremity swelling EXAM: BILATERAL LOWER EXTREMITY VENOUS DOPPLER ULTRASOUND TECHNIQUE: Gray-scale sonography with compression, as well as color and duplex ultrasound, were performed to evaluate the  deep venous system(s) from the level of the common femoral vein through the popliteal and proximal calf veins. COMPARISON:  None. FINDINGS: VENOUS Normal compressibility of the common femoral, superficial femoral, and popliteal veins, as well as the visualized calf veins. Visualized portions of profunda femoral vein and great saphenous vein unremarkable. No filling defects to suggest DVT on grayscale or color Doppler imaging. Doppler waveforms show normal direction of venous flow, normal respiratory plasticity and response to augmentation. OTHER None. Limitations: none IMPRESSION: Negative. Electronically Signed   By: Rolm Baptise M.D.   On: 01/24/2020 12:10   DG Chest Portable 1 View  Result Date: 01/23/2020 CLINICAL DATA:  Shortness of breath. EXAM: PORTABLE CHEST 1 VIEW COMPARISON:  May 04, 2019 FINDINGS: The heart size and mediastinal contours are within normal limits. Both lungs are clear. The visualized skeletal structures are unremarkable. IMPRESSION: No active disease. Electronically Signed   By: Virgina Norfolk M.D.   On: 01/23/2020 15:50  Scheduled Meds: . amLODipine  5 mg Oral Daily  . doxycycline  100 mg Oral BID  . furosemide  40 mg Intravenous Q12H  . heparin injection (subcutaneous)  5,000 Units Subcutaneous Q8H  . ipratropium-albuterol  3 mL Nebulization Q6H  . methylPREDNISolone (SOLU-MEDROL) injection  40 mg Intravenous Q6H   Followed by  . [START ON 01/25/2020] predniSONE  40 mg Oral Q breakfast  . metoprolol tartrate  25 mg Oral BID  . montelukast  10 mg Oral QHS    LOS: 0 days    Time spent: 35 minutes    Bellany Elbaum Darleen Crocker, DO [facility]  If 7PM-7AM, please contact night-coverage [email or web address] 01/24/2020, 2:04 PM

## 2020-01-24 NOTE — Evaluation (Signed)
Physical Therapy Evaluation Patient Details Name: Thomas [name] MRN: [id number] DOB: 12/30/1960 Today's Date: 01/24/2020   History of Present Illness  Thomas [name] is a 59 y.o. male with medical history significant of osteoarthritis, COPD, GERD, hypertension, urolithiasis, lateral meniscus and medial meniscus tear of right knee, class III obesity, OSA not on CPAP, prostate cancer, thyroid nodule who is coming to the emergency department due to progressively worse shortness of breath since Friday.  He denies fever, rhinorrhea, sore throat, but states he has been coughing, which is occasionally productive and occasionally produces pleuritic chest pain.  Dyspnea has been affecting his sleep hours.  No changes in appetite.  He denies dizziness, diaphoresis, palpitations, exertional chest pain, but states he has been having swelling of the lower extremities over the past month.  He denies abdominal pain, nausea, vomiting, diarrhea, constipation, melena or hematochezia.  No dysuria, frequency or hematuria.  Denies polyuria, polydipsia or blurred vision.    Clinical Impression  Patient exhibits reduced functional activity and ambulation tolerance related to cardiopulmonary limitations and exhibits reduced functional independence and tolerance as a result. Patient requires consistent cues in pursed-lip breathing, activity pacing, and energy conservation techniques.  Patient demonstrates deficiency in knowledge in use of supplemental O2 and AD to improve ambulation and activity tolerance/participation.  Demonstrates labored breathing and decline in saturation rates to 87% at rest on room air and 107 bpm heart rate.  Manifests 93% O2 sat using 3 L supplementation and HR of 124 bpm during short bouts of ambulation.  Patient will benefit from continued physical therapy in hospital and recommended venue below to increase endurance for safe ADLs and gait and provide potential training in AD or other compensatory  strategies to improve functional independence.      Follow Up Recommendations No PT follow up    Equipment Recommendations  None recommended by PT    Recommendations for Other Services       Precautions / Restrictions Restrictions Weight Bearing Restrictions: No      Mobility  Bed Mobility Overal bed mobility: Independent                  Transfers Overall transfer level: Independent                  Ambulation/Gait Ambulation/Gait assistance: Supervision Gait Distance (Feet): 40 Feet Assistive device: None Gait Pattern/deviations: WFL(Within Functional Limits) Gait velocity: decreased, labored   General Gait Details: consistent cues for pursed-lip breathing and O2 monitoring  Stairs Stairs: Yes Stairs assistance: Min guard Stair Management: One rail Right Number of Stairs: 2 General stair comments: fatigue/exertion limit further trial  Wheelchair Mobility    Modified Rankin (Stroke Patients Only)       Balance Overall balance assessment: Modified Independent                                           Pertinent Vitals/Pain Pain Assessment: No/denies pain    Home Living Family/patient expects to be discharged to:: Private residence Living Arrangements: Spouse/significant other Available Help at Discharge: Family;Available PRN/intermittently Type of Home: House Home Access: Stairs to enter Entrance Stairs-Rails: Can reach both Entrance Stairs-Number of Steps: 2-4 Home Layout: Two level Home Equipment: None      Prior Function Level of Independence: Independent         Comments: Patient is primary caregiver for his wife who  has dementia and assists in her self-care and ADL     Hand Dominance        Extremity/Trunk Assessment   Upper Extremity Assessment Upper Extremity Assessment: Overall WFL for tasks assessed    Lower Extremity Assessment Lower Extremity Assessment: Overall WFL for tasks assessed     Cervical / Trunk Assessment Cervical / Trunk Assessment: Normal  Communication   Communication: No difficulties  Cognition Arousal/Alertness: Awake/alert Behavior During Therapy: WFL for tasks assessed/performed Overall Cognitive Status: Within Functional Limits for tasks assessed                                        General Comments      Exercises     Assessment/Plan    PT Assessment Patient needs continued PT services  PT Problem List Decreased mobility;Decreased activity tolerance;Decreased knowledge of use of DME;Cardiopulmonary status limiting activity       PT Treatment Interventions DME instruction;Therapeutic exercise;Gait training;Stair training;Therapeutic activities;Patient/family education    PT Goals (Current goals can be found in the Care Plan section)  Acute Rehab PT Goals Patient Stated Goal: Be able to breathe when I'm up and walking PT Goal Formulation: With patient Time For Goal Achievement: 01/31/20 Potential to Achieve Goals: Good    Frequency Min 3X/week   Barriers to discharge Decreased caregiver support      Co-evaluation               AM-PAC PT "6 Clicks" Mobility  Outcome Measure Help needed turning from your back to your side while in a flat bed without using bedrails?: None Help needed moving from lying on your back to sitting on the side of a flat bed without using bedrails?: None Help needed moving to and from a bed to a chair (including a wheelchair)?: None Help needed standing up from a chair using your arms (e.g., wheelchair or bedside chair)?: None Help needed to walk in hospital room?: A Little Help needed climbing 3-5 steps with a railing? : A Lot 6 Click Score: 21    End of Session Equipment Utilized During Treatment: Oxygen Activity Tolerance: Patient limited by fatigue Patient left: in bed;with call bell/phone within reach Nurse Communication: Mobility status PT Visit Diagnosis: Other  abnormalities of gait and mobility (R26.89);Difficulty in walking, not elsewhere classified (R26.2);Unsteadiness on feet (R26.81)    Time: 8882-8003 PT Time Calculation (min) (ACUTE ONLY): 25 min   Charges:   PT Evaluation $PT Eval Low Complexity: 1 Low PT Treatments $Gait Training: 8-22 mins        11:28 AM, 01/24/20 M. Sherlyn Lees, PT, DPT Physical Therapist- [place] Office Number: [phone]

## 2020-01-25 ENCOUNTER — Inpatient Hospital Stay (HOSPITAL_COMMUNITY): Payer: 59

## 2020-01-25 DIAGNOSIS — J441 Chronic obstructive pulmonary disease with (acute) exacerbation: Secondary | ICD-10-CM

## 2020-01-25 DIAGNOSIS — I10 Essential (primary) hypertension: Secondary | ICD-10-CM

## 2020-01-25 DIAGNOSIS — I5033 Acute on chronic diastolic (congestive) heart failure: Secondary | ICD-10-CM

## 2020-01-25 DIAGNOSIS — G4733 Obstructive sleep apnea (adult) (pediatric): Secondary | ICD-10-CM

## 2020-01-25 DIAGNOSIS — J9601 Acute respiratory failure with hypoxia: Secondary | ICD-10-CM | POA: Diagnosis not present

## 2020-01-25 DIAGNOSIS — N183 Chronic kidney disease, stage 3 unspecified: Secondary | ICD-10-CM

## 2020-01-25 DIAGNOSIS — I5031 Acute diastolic (congestive) heart failure: Secondary | ICD-10-CM

## 2020-01-25 LAB — BASIC METABOLIC PANEL
Anion gap: 10 (ref 5–15)
BUN: 37 mg/dL — ABNORMAL HIGH (ref 6–20)
CO2: 26 mmol/L (ref 22–32)
Calcium: 9.3 mg/dL (ref 8.9–10.3)
Chloride: 100 mmol/L (ref 98–111)
Creatinine, Ser: 1.94 mg/dL — ABNORMAL HIGH (ref 0.61–1.24)
GFR, Estimated: 39 mL/min — ABNORMAL LOW (ref >60)
Glucose, Bld: 148 mg/dL — ABNORMAL HIGH (ref 70–99)
Potassium: 4.1 mmol/L (ref 3.5–5.1)
Sodium: 136 mmol/L (ref 135–145)

## 2020-01-25 LAB — CBC
HCT: 42.1 % (ref 39.0–52.0)
Hemoglobin: 13.6 g/dL (ref 13.0–17.0)
MCH: 29.5 pg (ref 26.0–34.0)
MCHC: 32.3 g/dL (ref 30.0–36.0)
MCV: 91.3 fL (ref 80.0–100.0)
Platelets: 344 10*3/uL (ref 150–400)
RBC: 4.61 MIL/uL (ref 4.22–5.81)
RDW: 13.6 % (ref 11.5–15.5)
WBC: 21.4 10*3/uL — ABNORMAL HIGH (ref 4.0–10.5)
nRBC: 0 % (ref 0.0–0.2)

## 2020-01-25 LAB — MAGNESIUM: Magnesium: 2.4 mg/dL (ref 1.7–2.4)

## 2020-01-25 LAB — GLUCOSE, CAPILLARY
Glucose-Capillary: 141 mg/dL — ABNORMAL HIGH (ref 70–99)
Glucose-Capillary: 123 mg/dL — ABNORMAL HIGH (ref 70–99)
Glucose-Capillary: 98 mg/dL (ref 70–99)
Glucose-Capillary: 102 mg/dL — ABNORMAL HIGH (ref 70–99)
Glucose-Capillary: 110 mg/dL — ABNORMAL HIGH (ref 70–99)

## 2020-01-25 LAB — ECHOCARDIOGRAM COMPLETE
Area-P 1/2: 5.06 cm2
Height: 69 in
S' Lateral: 3.4 cm
Weight: 4680.81 oz

## 2020-01-25 MED ORDER — METHYLPREDNISOLONE SODIUM SUCC 40 MG IJ SOLR
40.0000 mg | Freq: Two times a day (BID) | INTRAMUSCULAR | Status: DC
  Administered 2020-01-25 – 2020-01-27 (×4): 40 mg via INTRAVENOUS
  Filled 2020-01-25 (×4): qty 1

## 2020-01-25 MED ORDER — BUDESONIDE 0.5 MG/2ML IN SUSP
0.5000 mg | Freq: Two times a day (BID) | RESPIRATORY_TRACT | Status: DC
  Administered 2020-01-25 – 2020-01-27 (×4): 0.5 mg via RESPIRATORY_TRACT
  Filled 2020-01-25 (×4): qty 2

## 2020-01-25 MED ORDER — IPRATROPIUM-ALBUTEROL 0.5-2.5 (3) MG/3ML IN SOLN
3.0000 mL | Freq: Three times a day (TID) | RESPIRATORY_TRACT | Status: DC
  Administered 2020-01-26 – 2020-01-27 (×5): 3 mL via RESPIRATORY_TRACT
  Filled 2020-01-25 (×5): qty 3

## 2020-01-25 NOTE — Progress Notes (Signed)
*  PRELIMINARY RESULTS* Echocardiogram 2D Echocardiogram has been performed.  Thomas [name] 01/25/2020, 9:37 AM

## 2020-01-25 NOTE — Progress Notes (Signed)
Pt ambulated with NT, to opposite end of unit hall and back to room. Became very SOB but SATs remained above 90%. Will continue to monitor.

## 2020-01-25 NOTE — Progress Notes (Signed)
Physical Therapy Treatment Patient Details Name: Thomas [name] MRN: [id number] DOB: [birth year] Today's Date: 01/25/2020    History of Present Illness Thomas [name] is a 59 y.o. male with medical history significant of osteoarthritis, COPD, GERD, hypertension, urolithiasis, lateral meniscus and medial meniscus tear of right knee, class III obesity, OSA not on CPAP, prostate cancer, thyroid nodule who is coming to the emergency department due to progressively worse shortness of breath since Friday.  He denies fever, rhinorrhea, sore throat, but states he has been coughing, which is occasionally productive and occasionally produces pleuritic chest pain.  Dyspnea has been affecting his sleep hours.  No changes in appetite.  He denies dizziness, diaphoresis, palpitations, exertional chest pain, but states he has been having swelling of the lower extremities over the past month.  He denies abdominal pain, nausea, vomiting, diarrhea, constipation, melena or hematochezia.  No dysuria, frequency or hematuria.  Denies polyuria, polydipsia or blurred vision.    PT Comments    Tx session finds pt sitting EOB with nasal cannula in place at 3 L and demo 97% sat. Level at rest.  Cannula removed and pt sitting EOB x 2 min and demo 93% sat level.  Ambulation x 50 ft room air and demo decrease to 89-90% with labored breathing and reduced tolerance noted.  Replaced supplemental O2 at 3 L and able to recover level to 94% during exertion.  Patient able to demo improved ambulation tolerance to 300 ft while wearing supplemental O2 and HR fluctuates 90-107 bpm.     Follow Up Recommendations  No PT follow up     Equipment Recommendations  None recommended by PT    Recommendations for Other Services       Precautions / Restrictions Precautions Precautions: None Restrictions Weight Bearing Restrictions: No    Mobility  Bed Mobility Overal bed mobility: Independent                 Transfers Overall transfer level: Independent                  Ambulation/Gait Ambulation/Gait assistance: Modified independent (Device/Increase time) Gait Distance (Feet): 300 Feet Assistive device: None Gait Pattern/deviations: WFL(Within Functional Limits) Gait velocity: decreased, labored   General Gait Details: consistent cues for pursed-lip breathing and O2 monitoring   Stairs             Wheelchair Mobility    Modified Rankin (Stroke Patients Only)       Balance                                            Cognition Arousal/Alertness: Awake/alert Behavior During Therapy: WFL for tasks assessed/performed Overall Cognitive Status: Within Functional Limits for tasks assessed                                        Exercises      General Comments        Pertinent Vitals/Pain Pain Assessment: No/denies pain    Home Living                      Prior Function            PT Goals (current goals can now be found in the care plan section)  Acute Rehab PT Goals Patient Stated Goal: Be able to breathe when I'm up and walking PT Goal Formulation: With patient Time For Goal Achievement: 01/31/20 Potential to Achieve Goals: Good Additional Goals Additional Goal #1: Patient will demo safe and effective use of AD to improve ambulation tolerance maintaining saturation level >90% with exertion Progress towards PT goals: Progressing toward goals    Frequency    Min 3X/week      PT Plan Current plan remains appropriate    Co-evaluation              AM-PAC PT "6 Clicks" Mobility   Outcome Measure                   End of Session Equipment Utilized During Treatment: Oxygen Activity Tolerance: Patient limited by fatigue;Patient tolerated treatment well Patient left: in bed;with call bell/phone within reach Nurse Communication: Mobility status PT Visit Diagnosis: Other abnormalities of  gait and mobility (R26.89);Difficulty in walking, not elsewhere classified (R26.2);Unsteadiness on feet (R26.81)     Time: 1834-3735 PT Time Calculation (min) (ACUTE ONLY): 21 min  Charges:  $Gait Training: 8-22 mins                     10:45 AM, 01/25/20 M. Sherlyn Lees, PT, DPT Physical Therapist- [place] Office Number: [phone]

## 2020-01-25 NOTE — Progress Notes (Signed)
Initial Nutrition Assessment  DOCUMENTATION CODES:   Morbid obesity  INTERVENTION:  Provided nutrition education: Heart Failure Nutrition Therapy Handout   NUTRITION DIAGNOSIS:   Food and nutrition related knowledge deficit related to limited prior education (low sodium diet) as evidenced by  (Heart Failure diagnosis).  GOAL:   (Patient will adopt a low sodium diet eating pattern)   MONITOR:  Weight trends, I & O's   REASON FOR ASSESSMENT:  Consult Assessment of nutrition requirement/status  ASSESSMENT: Patient is an obese 58 yo male with history of COPD, GERD, HTN, CKD-3 and Prostate cancer. He presents with shortness of breath.  Po intake -100% of all meals. Patient currently receiving a  Heart Healthy diet with 1500 ml fluid restriction.    Home diet- regular diet based on diet history recall. Eggs, hash browns, toast and bacon or sausage for breakfast, dinner varies between meal at home or at a restaurant. Their favorites are: [employer], Pizza, BBQ. 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     Provided examples on ways to decrease sodium intake in diet. Discouraged intake of processed foods and use of salt shaker. Encouraged fresh fruits and vegetables as well as whole grain sources of carbohydrates to maximize fiber intake.   RD discussed why it is important for patient to adhere to diet recommendations, and emphasized the role of fluids, foods to avoid, and importance of weighing self daily.   Medications reviewed and include: Lasix, Insulin, Doxycycline.    Intake/Output Summary (Last 24 hours) at 01/25/2020 1405 Last data filed at 01/25/2020 0919 Gross per 24 hour  Intake 820 ml  Output 1550 ml  Net -730 ml  Usual body weight - 290 lb at his last Dr visit per pt. That is what he weighed in March of this year. Currently he is 132.7 kg (291.9 lb).     Labs: WBC-21.4 (H) BMP Latest Ref Rng & Units 01/25/2020 01/24/2020 01/23/2020  Glucose 70 -  99 mg/dL 148(H) 149(H) 103(H)  BUN 6 - 20 mg/dL 37(H) 25(H) 16  Creatinine 0.61 - 1.24 mg/dL 1.94(H) 2.10(H) 1.77(H)  Sodium 135 - 145 mmol/L 136 137 137  Potassium 3.5 - 5.1 mmol/L 4.1 4.0 3.5  Chloride 98 - 111 mmol/L 100 97(L) 99  CO2 22 - 32 mmol/L 26 25 27   Calcium 8.9 - 10.3 mg/dL 9.3 9.8 9.3     Diet Order:   Diet Order            Diet Heart Room service appropriate? Yes; Fluid consistency: Thin; Fluid restriction: 1500 mL Fluid  Diet effective now                 EDUCATION NEEDS:  Education needs have been addressed  Skin:  Skin Assessment: Reviewed RN Assessment. 2+ Edema BLE  Last BM:  11/22  Height:   Ht Readings from Last 1 Encounters:  01/24/20 5\' 9"  (1.753 m)    Weight:   Wt Readings from Last 1 Encounters:  01/25/20 132.7 kg    Ideal Body Weight:   73 kg   BMI:  Body mass index is 43.2 kg/m.  Estimated Nutritional Needs:   Kcal:  7824-2353  Protein:  65-70 gr  Fluid:  1500 ml fluid per MD  Colman Cater MS,RD,CSG,LDN Pager: Shea Evans

## 2020-01-25 NOTE — Progress Notes (Signed)
PROGRESS NOTE    Thomas [name]  XQJ:[id number] DOB: January 23, 1961 DOA: 01/23/2020 PCP: Asencion Noble, MD   Brief Narrative:  Per HPI: Thomas [name] is a 59 y.o. male with medical history significant of osteoarthritis, COPD, GERD, hypertension, urolithiasis, lateral meniscus and medial meniscus tear of right knee, class III obesity, OSA not on CPAP, prostate cancer, thyroid nodule who is coming to the emergency department due to progressively worse shortness of breath since Friday.  He denies fever, rhinorrhea, sore throat, but states he has been coughing, which is occasionally productive and occasionally produces pleuritic chest pain.  Dyspnea has been affecting his sleep hours.  No changes in appetite.  He denies dizziness, diaphoresis, palpitations, exertional chest pain, but states he has been having swelling of the lower extremities over the past month.  He denies abdominal pain, nausea, vomiting, diarrhea, constipation, melena or hematochezia.  No dysuria, frequency or hematuria.  Denies polyuria, polydipsia or blurred vision.  -Patient has been admitted with acute hypoxemic respiratory failure that appears multifactorial in the setting of COPD exacerbation as well as likely volume overload with associated CHF.  He has been started on steroids and breathing treatments and is also noted to have some profound orthopnea and exertional dyspnea.  Assessment & Plan:   Principal Problem:   Acute respiratory failure with hypoxia (HCC) Active Problems:   Essential hypertension   GERD (gastroesophageal reflux disease)   OSA (obstructive sleep apnea)   COPD with acute exacerbation (HCC)   CKD (chronic kidney disease), stage III (HCC)   Obesity, Class III, BMI 40-49.9 (morbid obesity) ([place])   Acute hypoxemic respiratory failure (HCC)   Acute hypoxemic respiratory failure-multifactorial -Appears to be in setting of COPD exacerbation and possibly CHF -Continue bronchodilators, start the use of  Pulmicort and continue the use of IV steroids. -Continue on doxycycline -Chest x-ray with no acute infiltrates. -Need to wean oxygen supplementation as tolerated.  Volume overload with suspected CHF exacerbation -BNP ordered and is noted to be 782 -Follow 2D echo results. -Continue Lasix IV 40 mg twice daily ordered -Continue IV Lasix -Continue to follow low-sodium diet, daily weights and strict I's and O's.  Essential hypertension -Continue metoprolol  -Continue low-sodium diet -Continue holding amlodipine -Follow vital signs.  OSA -Patient refuses to wear CPAP mask as it is uncomfortable -Education and discussion about importance of using CPAP provided.  Noted hyperglycemia -Likely in setting of steroids  -A1c 5.8; meeting criteria for prediabetes.  CKD stage IIIb -Baseline appears to be near 1.8-2 -Appears to be close to baseline -Continue to follow electrolytes and renal function closely while actively diuresing patient. -Minimize nephrotoxic agents.  Morbid obesity -Body mass index is 43.2 kg/m. -Low calorie diet, portion control increase physical activity discussed with patient -Outpatient evaluation of bariatric clinic recommended.    GERD -PPI  DVT prophylaxis: Heparin Code Status: Full code Family Communication: No family at bedside; patient expressed that he will call his wife with updates. Disposition Plan:  Status is: Inpatient  Dispo: The patient is from: Home              Anticipated d/c is to: Home              Anticipated d/c date is: 3 days or so              Patient currently is not medically stable to d/c.  Patient requires continue IV diuresis, IV steroids, nebulizer management, evaluation for home desaturation screening and close monitoring of  renal function and electrolytes.    Consultants:   None  Procedures:   See below  Antimicrobials:  Anti-infectives (From admission, onward)   Start     Dose/Rate Route Frequency Ordered Stop    01/23/20 2315  doxycycline (VIBRA-TABS) tablet 100 mg        100 mg Oral 2 times daily 01/23/20 2220        Subjective: No chest pain, no nausea or vomiting.  Reports ongoing orthopnea and shortness of breath with exertion.  Using 3 L nasal cannula supplementation and having difficulty speaking in full sentences.  Objective: Vitals:   01/25/20 0838 01/25/20 1035 01/25/20 1320 01/25/20 1710  BP: 130/82   124/84  Pulse: 99   85  Resp:    20  Temp:      TempSrc:      SpO2:  95% 95% 98%  Weight:      Height:        Intake/Output Summary (Last 24 hours) at 01/25/2020 1834 Last data filed at 01/25/2020 0919 Gross per 24 hour  Intake 480 ml  Output 1550 ml  Net -1070 ml   Filed Weights   01/23/20 1443 01/24/20 0205 01/25/20 0545  Weight: 131.5 kg 131.6 kg 132.7 kg    Examination: General exam: Alert, awake, oriented x 3; morbidly obese on evaluation; having difficulty to speak in full sentences, requiring 3 L of oxygen supplementation still complaining of orthopnea and shortness of breath with activity. Respiratory system: Diffuse expiratory wheezing, fine crackles at the bases; no using accessory muscles.  Easily developing tachypnea with activity. Cardiovascular system: Regular rate and rhythm; unable to assess JVD with body habitus.  No rubs or gallops. Gastrointestinal system: Abdomen is obese, soft, nontender, positive bowel sounds.   Central nervous system: Alert and oriented. No focal neurological deficits. Extremities: No cyanosis or; 2+ pitting edema appreciated bilaterally. Skin: No rashes, no petechiae. Psychiatry: Mood & affect appropriate.    Data Reviewed: I have personally reviewed following labs and imaging studies  CBC: Recent Labs  Lab 01/23/20 1536 01/25/20 0541  WBC 10.1 21.4*  NEUTROABS 6.2  --   HGB 14.3 13.6  HCT 43.5 42.1  MCV 89.3 91.3  PLT 301 329   Basic Metabolic Panel: Recent Labs  Lab 01/23/20 1536 01/24/20 0447 01/25/20 0541  NA  137 137 136  K 3.5 4.0 4.1  CL 99 97* 100  CO2 27 25 26   GLUCOSE 103* 149* 148*  BUN 16 25* 37*  CREATININE 1.77* 2.10* 1.94*  CALCIUM 9.3 9.8 9.3  MG 2.1  --  2.4  PHOS 4.0  --   --    GFR: Estimated Creatinine Clearance: 55.4 mL/min (A) (by C-G formula based on SCr of 1.94 mg/dL (H)).  HbA1C: Recent Labs    01/23/20 1536  HGBA1C 5.8*   CBG: Recent Labs  Lab 01/24/20 1637 01/24/20 2157 01/25/20 0747 01/25/20 1140 01/25/20 1711  GLUCAP 115* 141* 123* 98 102*    Recent Results (from the past 240 hour(s))  Resp Panel by RT-PCR (Flu A&B, Covid) Nasopharyngeal Swab     Status: None   Collection Time: 01/23/20  3:09 PM   Specimen: Nasopharyngeal Swab; Nasopharyngeal(NP) swabs in vial transport medium  Result Value Ref Range Status   SARS Coronavirus 2 by RT PCR NEGATIVE NEGATIVE Final    Comment: (NOTE) SARS-CoV-2 target nucleic acids are NOT DETECTED.  The SARS-CoV-2 RNA is generally detectable in upper respiratory specimens during the acute phase of infection.  The lowest concentration of SARS-CoV-2 viral copies this assay can detect is 138 copies/mL. A negative result does not preclude SARS-Cov-2 infection and should not be used as the sole basis for treatment or other patient management decisions. A negative result may occur with  improper specimen collection/handling, submission of specimen other than nasopharyngeal swab, presence of viral mutation(s) within the areas targeted by this assay, and inadequate number of viral copies(<138 copies/mL). A negative result must be combined with clinical observations, patient history, and epidemiological information. The expected result is Negative.  Fact Sheet for Patients:  EntrepreneurPulse.com.au  Fact Sheet for Healthcare Providers:  IncredibleEmployment.be  This test is no t yet approved or cleared by the [place] FDA and  has been authorized for detection and/or diagnosis  of SARS-CoV-2 by FDA under an Emergency Use Authorization (EUA). This EUA will remain  in effect (meaning this test can be used) for the duration of the COVID-19 declaration under Section 564(b)(1) of the Act, 21 U.S.C.section 360bbb-3(b)(1), unless the authorization is terminated  or revoked sooner.       Influenza A by PCR NEGATIVE NEGATIVE Final   Influenza B by PCR NEGATIVE NEGATIVE Final    Comment: (NOTE) The Xpert Xpress SARS-CoV-2/FLU/RSV plus assay is intended as an aid in the diagnosis of influenza from Nasopharyngeal swab specimens and should not be used as a sole basis for treatment. Nasal washings and aspirates are unacceptable for Xpert Xpress SARS-CoV-2/FLU/RSV testing.  Fact Sheet for Patients: EntrepreneurPulse.com.au  Fact Sheet for Healthcare Providers: IncredibleEmployment.be  This test is not yet approved or cleared by the [place] FDA and has been authorized for detection and/or diagnosis of SARS-CoV-2 by FDA under an Emergency Use Authorization (EUA). This EUA will remain in effect (meaning this test can be used) for the duration of the COVID-19 declaration under Section 564(b)(1) of the Act, 21 U.S.C. section 360bbb-3(b)(1), unless the authorization is terminated or revoked.  Performed at [facility], [address], [place], [place] [address]      Radiology Studies: US Venous Img Lower Bilateral (DVT)  Result Date: 01/24/2020 CLINICAL DATA:  Bilateral lower extremity swelling EXAM: BILATERAL LOWER EXTREMITY VENOUS DOPPLER ULTRASOUND TECHNIQUE: Gray-scale sonography with compression, as well as color and duplex ultrasound, were performed to evaluate the deep venous system(s) from the level of the common femoral vein through the popliteal and proximal calf veins. COMPARISON:  None. FINDINGS: VENOUS Normal compressibility of the common femoral, superficial femoral, and popliteal veins, as well as the visualized calf  veins. Visualized portions of profunda femoral vein and great saphenous vein unremarkable. No filling defects to suggest DVT on grayscale or color Doppler imaging. Doppler waveforms show normal direction of venous flow, normal respiratory plasticity and response to augmentation. OTHER None. Limitations: none IMPRESSION: Negative. Electronically Signed   By: Rolm Baptise M.D.   On: 01/24/2020 12:10   ECHOCARDIOGRAM COMPLETE  Result Date: 01/25/2020    ECHOCARDIOGRAM REPORT   Patient Name:   MORRISON MCBRYAR Date of Exam: 01/25/2020 Medical Rec #:  [id number]        Height:       69.0 in Accession #:    [id number]       Weight:       292.5 lb Date of Birth:  03/29/1960        BSA:          2.428 m Patient Age:    72 years         BP:  99/56 mmHg Patient Gender: M                HR:           93 bpm. Exam Location:  [facility] Procedure: 2D Echo Indications:    CHF-Acute Diastolic 110.31 / R94.58  History:        Patient has prior history of Echocardiogram examinations, most                 recent 08/13/2019. COPD, Signs/Symptoms:Dyspnea; Risk                 Factors:Hypertension and Former Smoker. Prostate Cancer, GERD,                 Acute Renal Failure.  Sonographer:    Leavy Cella RDCS (AE) Referring Phys: [phone] Royanne Foots [place]  1. Left ventricular ejection fraction, by estimation, is 60 to 65%. The left ventricle has normal function. The left ventricle has no regional wall motion abnormalities. There is moderate left ventricular hypertrophy. Left ventricular diastolic parameters are indeterminate.  2. Right ventricular systolic function is normal. The right ventricular size is normal. Tricuspid regurgitation signal is inadequate for assessing PA pressure.  3. A small pericardial effusion is present. The pericardial effusion is anterior to the right ventricle.  4. The mitral valve is grossly normal. Trivial mitral valve regurgitation.  5. The aortic valve is tricuspid. Aortic valve  regurgitation is not visualized.  6. The inferior vena cava is normal in size with greater than 50% respiratory variability, suggesting right atrial pressure of 3 mmHg. FINDINGS  Left Ventricle: Left ventricular ejection fraction, by estimation, is 60 to 65%. The left ventricle has normal function. The left ventricle has no regional wall motion abnormalities. The left ventricular internal cavity size was normal in size. There is  moderate left ventricular hypertrophy. Left ventricular diastolic parameters are indeterminate. Right Ventricle: The right ventricular size is normal. No increase in right ventricular wall thickness. Right ventricular systolic function is normal. Tricuspid regurgitation signal is inadequate for assessing PA pressure. Left Atrium: Left atrial size was normal in size. Right Atrium: Right atrial size was normal in size. Pericardium: A small pericardial effusion is present. The pericardial effusion is anterior to the right ventricle. Presence of pericardial fat pad. Mitral Valve: The mitral valve is grossly normal. Trivial mitral valve regurgitation. Tricuspid Valve: The tricuspid valve is grossly normal. Tricuspid valve regurgitation is trivial. Aortic Valve: The aortic valve is tricuspid. There is mild aortic valve annular calcification. Aortic valve regurgitation is not visualized. Pulmonic Valve: The pulmonic valve was grossly normal. Pulmonic valve regurgitation is trivial. Aorta: The aortic root is normal in size and structure. Venous: The inferior vena cava is normal in size with greater than 50% respiratory variability, suggesting right atrial pressure of 3 mmHg. IAS/Shunts: No atrial level shunt detected by color flow Doppler.  LEFT VENTRICLE PLAX 2D LVIDd:         5.03 cm  Diastology LVIDs:         3.40 cm  LV e' medial:    7.29 cm/s LV PW:         1.40 cm  LV E/e' medial:  14.7 LV IVS:        1.37 cm  LV e' lateral:   11.70 cm/s LVOT diam:     2.10 cm  LV E/e' lateral: 9.1 LVOT Area:      3.46 cm  RIGHT VENTRICLE RV S prime:  21.60 cm/s TAPSE (M-mode): 2.4 cm LEFT ATRIUM             Index       RIGHT ATRIUM           Index LA diam:        3.80 cm 1.56 cm/m  RA Area:     11.60 cm LA Vol (A2C):   54.8 ml 22.57 ml/m RA Volume:   25.10 ml  10.34 ml/m LA Vol (A4C):   32.1 ml 13.22 ml/m LA Biplane Vol: 43.6 ml 17.95 ml/m   AORTA Ao Root diam: 3.40 cm MITRAL VALVE MV Area (PHT): 5.06 cm     SHUNTS MV Decel Time: 150 msec     Systemic Diam: 2.10 cm MV E velocity: 107.00 cm/s MV A velocity: 77.10 cm/s MV E/A ratio:  1.39 Rozann Lesches MD Electronically signed by Rozann Lesches MD Signature Date/Time: 01/25/2020/11:37:07 AM    Final     Scheduled Meds: . acetylcysteine  1 mL Nebulization BID  . doxycycline  100 mg Oral BID  . furosemide  40 mg Intravenous Q12H  . heparin injection (subcutaneous)  5,000 Units Subcutaneous Q8H  . insulin aspart  0-15 Units Subcutaneous TID WC  . insulin aspart  0-5 Units Subcutaneous QHS  . ipratropium-albuterol  3 mL Nebulization Q6H  . metoprolol tartrate  25 mg Oral BID  . montelukast  10 mg Oral QHS  . predniSONE  40 mg Oral Q breakfast    LOS: 1 day    Time spent: 30 minutes    Barton Dubois, MD [facility]  If 7PM-7AM, please contact night-coverage [email or web address] 01/25/2020, 6:34 PM

## 2020-01-26 DIAGNOSIS — I10 Essential (primary) hypertension: Secondary | ICD-10-CM | POA: Diagnosis not present

## 2020-01-26 DIAGNOSIS — J9601 Acute respiratory failure with hypoxia: Secondary | ICD-10-CM | POA: Diagnosis not present

## 2020-01-26 DIAGNOSIS — J441 Chronic obstructive pulmonary disease with (acute) exacerbation: Secondary | ICD-10-CM | POA: Diagnosis not present

## 2020-01-26 DIAGNOSIS — N183 Chronic kidney disease, stage 3 unspecified: Secondary | ICD-10-CM | POA: Diagnosis not present

## 2020-01-26 LAB — GLUCOSE, CAPILLARY
Glucose-Capillary: 118 mg/dL — ABNORMAL HIGH (ref 70–99)
Glucose-Capillary: 164 mg/dL — ABNORMAL HIGH (ref 70–99)
Glucose-Capillary: 100 mg/dL — ABNORMAL HIGH (ref 70–99)
Glucose-Capillary: 109 mg/dL — ABNORMAL HIGH (ref 70–99)

## 2020-01-26 NOTE — Progress Notes (Signed)
PROGRESS NOTE    DODGE ATOR  LOV:[id number] DOB: 07-21-1960 DOA: 01/23/2020 PCP: Asencion Noble, MD   Brief Narrative:  Per HPI: JB Thomas [name] is a 59 y.o. male with medical history significant of osteoarthritis, COPD, GERD, hypertension, urolithiasis, lateral meniscus and medial meniscus tear of right knee, class III obesity, OSA not on CPAP, prostate cancer, thyroid nodule who is coming to the emergency department due to progressively worse shortness of breath since Friday.  He denies fever, rhinorrhea, sore throat, but states he has been coughing, which is occasionally productive and occasionally produces pleuritic chest pain.  Dyspnea has been affecting his sleep hours.  No changes in appetite.  He denies dizziness, diaphoresis, palpitations, exertional chest pain, but states he has been having swelling of the lower extremities over the past month.  He denies abdominal pain, nausea, vomiting, diarrhea, constipation, melena or hematochezia.  No dysuria, frequency or hematuria.  Denies polyuria, polydipsia or blurred vision.  -Patient has been admitted with acute hypoxemic respiratory failure that appears multifactorial in the setting of COPD exacerbation as well as likely volume overload with associated CHF.  He has been started on steroids and breathing treatments and is also noted to have some profound orthopnea and exertional dyspnea.  Assessment & Plan:   Principal Problem:   Acute respiratory failure with hypoxia (HCC) Active Problems:   Essential hypertension   GERD (gastroesophageal reflux disease)   OSA (obstructive sleep apnea)   COPD with acute exacerbation (HCC)   CKD (chronic kidney disease), stage III (HCC)   Obesity, Class III, BMI 40-49.9 (morbid obesity) ([place])   Acute hypoxemic respiratory failure (HCC)   Acute hypoxemic respiratory failure-multifactorial -Appears to be in setting of COPD exacerbation and possibly CHF -Continue bronchodilators, start the use of  Pulmicort and continue the use of IV steroids. -Continue on doxycycline -Chest x-ray with no acute infiltrates. -Currently not requiring O2 supplementation at rest.  Volume overload with suspected CHF exacerbation -BNP ordered and is noted to be 782 -2D echo demonstrating no wall motion abnormalities, preserved ejection fraction with an EF of 60 to 65%. -will Continue Lasix IV 40 mg twice daily as ordered -Continue to follow low-sodium diet, daily weights and strict I's and O's.  Essential hypertension -Continue metoprolol  -Continue low-sodium diet -Continue holding amlodipine for now. -will Follow vital signs.  OSA -Patient continued to intermittently refuse to wear CPAP mask as it is uncomfortable -Education and discussion about importance of using CPAP provided.  Noted hyperglycemia -Likely in setting of steroids usage. -A1c 5.8; meeting criteria for prediabetes. -Instructed to follow modified carbohydrate diet and pursue lifestyle changes.  CKD stage IIIb -Baseline appears to be near 1.8-2 -Appears to be close to baseline -Continue to follow electrolytes and renal function closely while actively diuresing patient. -Continue to minimize nephrotoxic agents.  Morbid obesity -Body mass index is 43.23 kg/m. -Low calorie diet, portion control increase physical activity discussed with patient -Outpatient evaluation of bariatric clinic recommended.    GERD -Continue PPI  DVT prophylaxis: Heparin Code Status: Full code Family Communication: No family at bedside; patient expressed that he will call his wife with updates. Disposition Plan:  Status is: Inpatient  Dispo: The patient is from: Home              Anticipated d/c is to: Home              Anticipated d/c date is: 1-2 days  Patient currently is not medically stable to d/c.  Patient requires continue IV diuresis, IV steroids, nebulizer management, evaluation for home desaturation screening and close  monitoring of renal function and electrolytes.    Consultants:   None  Procedures:   See below  2D echo: 1. Left ventricular ejection fraction, by estimation, is 60 to 65%. The  left ventricle has normal function. The left ventricle has no regional  wall motion abnormalities. There is moderate left ventricular hypertrophy.  Left ventricular diastolic  parameters are indeterminate.  2. Right ventricular systolic function is normal. The right ventricular  size is normal. Tricuspid regurgitation signal is inadequate for assessing  PA pressure.  3. A small pericardial effusion is present. The pericardial effusion is  anterior to the right ventricle.  4. The mitral valve is grossly normal. Trivial mitral valve  regurgitation.  5. The aortic valve is tricuspid. Aortic valve regurgitation is not  visualized.  6. The inferior vena cava is normal in size with greater than 50%  respiratory variability, suggesting right atrial pressure of 3 mmHg.  Antimicrobials:  Anti-infectives (From admission, onward)   Start     Dose/Rate Route Frequency Ordered Stop   01/23/20 2315  doxycycline (VIBRA-TABS) tablet 100 mg        100 mg Oral 2 times daily 01/23/20 2220        Subjective: No chest pain, no nausea, no vomiting.  Patient is afebrile.  Still having difficulty speaking in full sentences and complaining of orthopnea.  Felt short of breath during ambulating evaluation for desats.  Objective: Vitals:   01/26/20 0500 01/26/20 0729 01/26/20 0737 01/26/20 1353  BP:      Pulse:      Resp:      Temp:      TempSrc:      SpO2:  95% 95% 93%  Weight: 132.8 kg     Height:        Intake/Output Summary (Last 24 hours) at 01/26/2020 1627 Last data filed at 01/26/2020 0943 Gross per 24 hour  Intake 365 ml  Output 1000 ml  Net -635 ml   Filed Weights   01/24/20 0205 01/25/20 0545 01/26/20 0500  Weight: 131.6 kg 132.7 kg 132.8 kg    Examination: General exam: No fever, no chest  pain, no nausea, no vomiting.  Still having complains of orthopnea and shortness of breath with exertion.  Also mild difficulty speaking in full sentences appreciated. Respiratory system: Positive expiratory wheezing, bilateral rhonchi and fine crackles appreciated on examination.  No using accessory muscles. Cardiovascular system: Rate controlled, no rubs, no gallops, unable to assess JVD with body habitus. Gastrointestinal system: Abdomen is obese, nondistended, soft and nontender. No organomegaly or masses felt. Normal bowel sounds heard. Central nervous system: Alert and oriented. No focal neurological deficits. Extremities: No cyanosis or clubbing; 1-2+ edema appreciated bilaterally. Skin: No petechiae. Psychiatry: Mood & affect appropriate.    Data Reviewed: I have personally reviewed following labs and imaging studies  CBC: Recent Labs  Lab 01/23/20 1536 01/25/20 0541  WBC 10.1 21.4*  NEUTROABS 6.2  --   HGB 14.3 13.6  HCT 43.5 42.1  MCV 89.3 91.3  PLT 301 782   Basic Metabolic Panel: Recent Labs  Lab 01/23/20 1536 01/24/20 0447 01/25/20 0541  NA 137 137 136  K 3.5 4.0 4.1  CL 99 97* 100  CO2 27 25 26   GLUCOSE 103* 149* 148*  BUN 16 25* 37*  CREATININE 1.77* 2.10* 1.94*  CALCIUM 9.3 9.8 9.3  MG 2.1  --  2.4  PHOS 4.0  --   --    GFR: Estimated Creatinine Clearance: 55.4 mL/min (A) (by C-G formula based on SCr of 1.94 mg/dL (H)).  A1C: 5.8  CBG: Recent Labs  Lab 01/24/20 2157 01/25/20 0747 01/25/20 1140 01/25/20 1711 01/25/20 2034  GLUCAP 141* 123* 98 102* 110*    Recent Results (from the past 240 hour(s))  Resp Panel by RT-PCR (Flu A&B, Covid) Nasopharyngeal Swab     Status: None   Collection Time: 01/23/20  3:09 PM   Specimen: Nasopharyngeal Swab; Nasopharyngeal(NP) swabs in vial transport medium  Result Value Ref Range Status   SARS Coronavirus 2 by RT PCR NEGATIVE NEGATIVE Final    Comment: (NOTE) SARS-CoV-2 target nucleic acids are NOT  DETECTED.  The SARS-CoV-2 RNA is generally detectable in upper respiratory specimens during the acute phase of infection. The lowest concentration of SARS-CoV-2 viral copies this assay can detect is 138 copies/mL. A negative result does not preclude SARS-Cov-2 infection and should not be used as the sole basis for treatment or other patient management decisions. A negative result may occur with  improper specimen collection/handling, submission of specimen other than nasopharyngeal swab, presence of viral mutation(s) within the areas targeted by this assay, and inadequate number of viral copies(<138 copies/mL). A negative result must be combined with clinical observations, patient history, and epidemiological information. The expected result is Negative.  Fact Sheet for Patients:  EntrepreneurPulse.com.au  Fact Sheet for Healthcare Providers:  IncredibleEmployment.be  This test is no t yet approved or cleared by the [place] FDA and  has been authorized for detection and/or diagnosis of SARS-CoV-2 by FDA under an Emergency Use Authorization (EUA). This EUA will remain  in effect (meaning this test can be used) for the duration of the COVID-19 declaration under Section 564(b)(1) of the Act, 21 U.S.C.section 360bbb-3(b)(1), unless the authorization is terminated  or revoked sooner.       Influenza A by PCR NEGATIVE NEGATIVE Final   Influenza B by PCR NEGATIVE NEGATIVE Final    Comment: (NOTE) The Xpert Xpress SARS-CoV-2/FLU/RSV plus assay is intended as an aid in the diagnosis of influenza from Nasopharyngeal swab specimens and should not be used as a sole basis for treatment. Nasal washings and aspirates are unacceptable for Xpert Xpress SARS-CoV-2/FLU/RSV testing.  Fact Sheet for Patients: EntrepreneurPulse.com.au  Fact Sheet for Healthcare Providers: IncredibleEmployment.be  This test is not yet  approved or cleared by the [place] FDA and has been authorized for detection and/or diagnosis of SARS-CoV-2 by FDA under an Emergency Use Authorization (EUA). This EUA will remain in effect (meaning this test can be used) for the duration of the COVID-19 declaration under Section 564(b)(1) of the Act, 21 U.S.C. section 360bbb-3(b)(1), unless the authorization is terminated or revoked.  Performed at [name], [address] [place], [place] [address]      Radiology Studies: ECHOCARDIOGRAM COMPLETE  Result Date: 01/25/2020    ECHOCARDIOGRAM REPORT   Patient Name:   Thomas [name] Date of Exam: 01/25/2020 Medical Rec #:  [id number]        Height:       69.0 in Accession #:    [id number]       Weight:       292.5 lb Date of Birth:  04-04-1960        BSA:          2.428 m Patient Age:  59 years         BP:           99/56 mmHg Patient Gender: M                HR:           93 bpm. Exam Location:  [facility] Procedure: 2D Echo Indications:    CHF-Acute Diastolic 412.87 / O67.67  History:        Patient has prior history of Echocardiogram examinations, most                 recent 08/13/2019. COPD, Signs/Symptoms:Dyspnea; Risk                 Factors:Hypertension and Former Smoker. Prostate Cancer, GERD,                 Acute Renal Failure.  Sonographer:    Leavy Cella RDCS (AE) Referring Phys: [phone] Royanne Foots [place]  1. Left ventricular ejection fraction, by estimation, is 60 to 65%. The left ventricle has normal function. The left ventricle has no regional wall motion abnormalities. There is moderate left ventricular hypertrophy. Left ventricular diastolic parameters are indeterminate.  2. Right ventricular systolic function is normal. The right ventricular size is normal. Tricuspid regurgitation signal is inadequate for assessing PA pressure.  3. A small pericardial effusion is present. The pericardial effusion is anterior to the right ventricle.  4. The mitral valve is grossly  normal. Trivial mitral valve regurgitation.  5. The aortic valve is tricuspid. Aortic valve regurgitation is not visualized.  6. The inferior vena cava is normal in size with greater than 50% respiratory variability, suggesting right atrial pressure of 3 mmHg. FINDINGS  Left Ventricle: Left ventricular ejection fraction, by estimation, is 60 to 65%. The left ventricle has normal function. The left ventricle has no regional wall motion abnormalities. The left ventricular internal cavity size was normal in size. There is  moderate left ventricular hypertrophy. Left ventricular diastolic parameters are indeterminate. Right Ventricle: The right ventricular size is normal. No increase in right ventricular wall thickness. Right ventricular systolic function is normal. Tricuspid regurgitation signal is inadequate for assessing PA pressure. Left Atrium: Left atrial size was normal in size. Right Atrium: Right atrial size was normal in size. Pericardium: A small pericardial effusion is present. The pericardial effusion is anterior to the right ventricle. Presence of pericardial fat pad. Mitral Valve: The mitral valve is grossly normal. Trivial mitral valve regurgitation. Tricuspid Valve: The tricuspid valve is grossly normal. Tricuspid valve regurgitation is trivial. Aortic Valve: The aortic valve is tricuspid. There is mild aortic valve annular calcification. Aortic valve regurgitation is not visualized. Pulmonic Valve: The pulmonic valve was grossly normal. Pulmonic valve regurgitation is trivial. Aorta: The aortic root is normal in size and structure. Venous: The inferior vena cava is normal in size with greater than 50% respiratory variability, suggesting right atrial pressure of 3 mmHg. IAS/Shunts: No atrial level shunt detected by color flow Doppler.  LEFT VENTRICLE PLAX 2D LVIDd:         5.03 cm  Diastology LVIDs:         3.40 cm  LV e' medial:    7.29 cm/s LV PW:         1.40 cm  LV E/e' medial:  14.7 LV IVS:         1.37 cm  LV e' lateral:   11.70 cm/s LVOT diam:     2.10 cm  LV E/e' lateral: 9.1 LVOT Area:     3.46 cm  RIGHT VENTRICLE RV S prime:     21.60 cm/s TAPSE (M-mode): 2.4 cm LEFT ATRIUM             Index       RIGHT ATRIUM           Index LA diam:        3.80 cm 1.56 cm/m  RA Area:     11.60 cm LA Vol (A2C):   54.8 ml 22.57 ml/m RA Volume:   25.10 ml  10.34 ml/m LA Vol (A4C):   32.1 ml 13.22 ml/m LA Biplane Vol: 43.6 ml 17.95 ml/m   AORTA Ao Root diam: 3.40 cm MITRAL VALVE MV Area (PHT): 5.06 cm     SHUNTS MV Decel Time: 150 msec     Systemic Diam: 2.10 cm MV E velocity: 107.00 cm/s MV A velocity: 77.10 cm/s MV E/A ratio:  1.39 Rozann Lesches MD Electronically signed by Rozann Lesches MD Signature Date/Time: 01/25/2020/11:37:07 AM    Final     Scheduled Meds: . budesonide (PULMICORT) nebulizer solution  0.5 mg Nebulization BID  . doxycycline  100 mg Oral BID  . furosemide  40 mg Intravenous Q12H  . heparin injection (subcutaneous)  5,000 Units Subcutaneous Q8H  . insulin aspart  0-15 Units Subcutaneous TID WC  . insulin aspart  0-5 Units Subcutaneous QHS  . ipratropium-albuterol  3 mL Nebulization TID  . methylPREDNISolone (SOLU-MEDROL) injection  40 mg Intravenous Q12H  . metoprolol tartrate  25 mg Oral BID  . montelukast  10 mg Oral QHS    LOS: 2 days    Time spent: 30 minutes    Barton Dubois, MD [facility]  If 7PM-7AM, please contact night-coverage [email or web address] 01/26/2020, 4:27 PM

## 2020-01-26 NOTE — Progress Notes (Signed)
SATURATION QUALIFICATIONS: (This note is used to comply with regulatory documentation for home oxygen)  Patient Saturations on Room Air at Rest = 95%  Patient Saturations on Room Air while Ambulating = 93%  Patient Saturations on 2 Liters of oxygen while Ambulating = 95%

## 2020-01-27 DIAGNOSIS — K219 Gastro-esophageal reflux disease without esophagitis: Secondary | ICD-10-CM

## 2020-01-27 DIAGNOSIS — I5033 Acute on chronic diastolic (congestive) heart failure: Secondary | ICD-10-CM

## 2020-01-27 DIAGNOSIS — J9601 Acute respiratory failure with hypoxia: Secondary | ICD-10-CM | POA: Diagnosis not present

## 2020-01-27 DIAGNOSIS — N1831 Chronic kidney disease, stage 3a: Secondary | ICD-10-CM

## 2020-01-27 DIAGNOSIS — J441 Chronic obstructive pulmonary disease with (acute) exacerbation: Secondary | ICD-10-CM | POA: Diagnosis not present

## 2020-01-27 DIAGNOSIS — I10 Essential (primary) hypertension: Secondary | ICD-10-CM | POA: Diagnosis not present

## 2020-01-27 LAB — GLUCOSE, CAPILLARY
Glucose-Capillary: 127 mg/dL — ABNORMAL HIGH (ref 70–99)
Glucose-Capillary: 92 mg/dL (ref 70–99)

## 2020-01-27 MED ORDER — PREDNISONE 20 MG PO TABS
ORAL_TABLET | ORAL | 0 refills | Status: DC
Start: 2020-01-27 — End: 2020-07-31

## 2020-01-27 MED ORDER — DOXYCYCLINE HYCLATE 100 MG PO TABS: 100.0000 mg | ORAL_TABLET | Freq: Two times a day (BID) | ORAL | 0 refills | Status: AC

## 2020-01-27 MED ORDER — TORSEMIDE 20 MG PO TABS
ORAL_TABLET | ORAL | 1 refills | Status: DC
Start: 2020-01-27 — End: 2020-07-31

## 2020-01-27 MED ORDER — BUDESONIDE-FORMOTEROL FUMARATE 160-4.5 MCG/ACT IN AERO: 2.0000 | INHALATION_SPRAY | Freq: Two times a day (BID) | RESPIRATORY_TRACT | 1 refills | Status: DC

## 2020-01-27 NOTE — Discharge Summary (Signed)
Physician Discharge Summary  Thomas [name] XLK:[id number] DOB: September 23, 1960 DOA: 01/23/2020  PCP: Asencion Noble, MD  Admit date: 01/23/2020 Discharge date: 01/27/2020  Time spent: 35 minutes  Recommendations for Outpatient Follow-up:  1. Repeat basic panel to follow electrolytes renal function 2. Rest pressure and further adjust antihypertensive regimen as needed 3. Continue close monitoring of patient's CBGs/A1c with initiation of hypoglycemic regimen as required 4. Outpatient referral to bariatric clinic recommended.   Discharge Diagnoses:  Principal Problem:   Acute respiratory failure with hypoxia (HCC) Active Problems:   Essential hypertension   GERD (gastroesophageal reflux disease)   OSA (obstructive sleep apnea)   COPD with acute exacerbation (HCC)   CKD (chronic kidney disease), stage III (HCC)   Obesity, Class III, BMI 40-49.9 (morbid obesity) ([place])   Acute hypoxemic respiratory failure (HCC)   Acute on chronic diastolic HF (heart failure) ([place])   Discharge Condition: Stable and improved.  Patient discharged home with instruction to follow-up with PCP in 10 days.  CODE STATUS: Full code.  Diet recommendation: Low calorie, heart healthy and modify carbohydrate diet.  Filed Weights   01/25/20 0545 01/26/20 0500 01/27/20 0500  Weight: 132.7 kg 132.8 kg 131.8 kg    History of present illness:  Per HPI: Oluwadarasimi A Pegramis a 59 y.o.malewith medical history significant ofosteoarthritis, COPD, GERD, hypertension, urolithiasis, lateral meniscus and medial meniscus tear of right knee, class III obesity, OSA not on CPAP, prostate cancer, thyroid nodule who is coming to the emergency department due toprogressively worse shortness of breath since Friday. He denies fever, rhinorrhea, sore throat, but states he has been coughing, which is occasionally productive and occasionally produces pleuritic chest pain. Dyspnea has been affecting his sleep hours. No changes in  appetite. He denies dizziness, diaphoresis, palpitations, exertional chest pain, but states he has been having swelling of the lower extremities over the past month. He denies abdominal pain, nausea, vomiting, diarrhea, constipation, melena or hematochezia. No dysuria, frequency or hematuria. Denies polyuria, polydipsia or blurred vision.  -Patient has been admitted with acute hypoxemic respiratory failure that appears multifactorial in the setting of COPD exacerbation as well as likely volume overload with associated CHF.  He has been started on steroids and breathing treatments and is also noted to have some profound orthopnea and exertional dyspnea.  Hospital Course:  Acute hypoxemic respiratory failure-multifactorial -Appears to be in setting of COPD exacerbation and possibly CHF -Resume home bronchodilators regimen and completed steroids tapering as instructed. -Continue oral doxycycline to complete therapy. -Chest x-ray with no acute infiltrates. -No oxygen supplementation required at discharge. -Will recommend outpatient follow-up with pulmonologist for PFTs and further adjustment to maintenance management.  Acute on chronic diastolic heart failure -BNP ordered and is noted to be 782 -2D echo demonstrating no wall motion abnormalities, preserved ejection fraction with an EF of 60 to 65%. -Continue the use of metoprolol -Continue to follow low-sodium diet, and daily weights -Patient has been discharged on adjusted dose of Demadex using 40 mg in the morning and 20 mg in the evening.  Essential hypertension -Resume home antihypertensive regimen. -Continue low-sodium diet -Continue to follow vital signs and adjust antihypertensive regimen.  OSA -Patient continued to intermittently refuse to wear CPAP mask as it is uncomfortable -Education and discussion about importance of using CPAP provided.  Noted hyperglycemia -Likely in setting of steroids usage. -A1c 5.8; meeting  criteria for prediabetes. -Instructed to follow modified carbohydrate diet and pursue lifestyle changes. -Close follow-up patient's CBGs and repeat A1c as an outpatient  with initiation of hypoglycemic regimen when appropriate (especially something like Metformin and Jardiance, given overweight and congestive heart failure component).  CKD stage IIIb -Baseline appears to be near 1.8-2 -Appears to be at baseline at discharge -Latest creatinine 1.94 -Repeat basic metabolic panel follow-up visit to continue following electrolytes and renal function closely while, as his diuretic regimen has changed -Continue to minimize nephrotoxic agents.  Morbid obesity -Body mass index is 43.23 kg/m. -Low calorie diet, portion control increase physical activity discussed with patient -Outpatient evaluation at bariatric clinic recommended.    GERD -Continue PPI   Procedures:  See below for x-ray reports  2D echo: 1. Left ventricular ejection fraction, by estimation, is 60 to 65%. The  left ventricle has normal function. The left ventricle has no regional  wall motion abnormalities. There is moderate left ventricular hypertrophy.  Left ventricular diastolic  parameters are indeterminate.  2. Right ventricular systolic function is normal. The right ventricular  size is normal. Tricuspid regurgitation signal is inadequate for assessing  PA pressure.  3. A small pericardial effusion is present. The pericardial effusion is  anterior to the right ventricle.  4. The mitral valve is grossly normal. Trivial mitral valve  regurgitation.  5. The aortic valve is tricuspid. Aortic valve regurgitation is not  visualized.  6. The inferior vena cava is normal in size with greater than 50%  respiratory variability, suggesting right atrial pressure of 3 mmHg.   Consultations:  None  Discharge Exam: Vitals:   01/27/20 1425 01/27/20 1454  BP:  132/83  Pulse:  98  Resp:  20  Temp:  (!) 97.1 F  (36.2 C)  SpO2: 92% 96%    General: Afebrile, no chest pain, no nausea, no vomiting.  Patient reports improvement in his breathing and is no longer requiring oxygen supplementation.  Feeling ready to go home.  At time of discharge speaking in full sentences. Cardiovascular: Rate controlled, no rubs, no gallops, unable to assess JVD with body habitus. Respiratory: Improved air movement bilaterally, no frank crackles, currently no wheezing.  No using accessory muscles good oxygen saturation on room air. Abdomen: Obese, soft, nontender, nondistended, positive bowel sounds Extremities: Trace edema bilaterally, no cyanosis or clubbing.  Discharge Instructions   Discharge Instructions    (HEART FAILURE PATIENTS) Call MD:  Anytime you have any of the following symptoms: 1) 3 pound weight gain in 24 hours or 5 pounds in 1 week 2) shortness of breath, with or without a dry hacking cough 3) swelling in the hands, feet or stomach 4) if you have to sleep on extra pillows at night in order to breathe.   Complete by: As directed    Diet - low sodium heart healthy   Complete by: As directed    Discharge instructions   Complete by: As directed    Take medications as prescribed Follow low-sodium diet (less than 2 g daily) Keep legs elevated as much as possible to assist with swelling Be compliant with the use of CPAP at bedtime Complete antibiotics and the steroids tapering as instructed. Check your weight on daily basis Maintain adequate hydration Arrange follow-up with PCP in 10 days.     Allergies as of 01/27/2020   No Known Allergies     Medication List    STOP taking these medications   amLODipine 5 MG tablet Commonly known as: NORVASC     TAKE these medications   acetaminophen 500 MG tablet Commonly known as: TYLENOL Take 500 mg by mouth  every 6 (six) hours as needed for moderate pain.   Albuterol Sulfate 108 (90 Base) MCG/ACT Aepb Commonly known as: PROAIR RESPICLICK Inhale 2  puffs into the lungs every 6 (six) hours as needed (sob, wheeze).   budesonide-formoterol 160-4.5 MCG/ACT inhaler Commonly known as: SYMBICORT Inhale 2 puffs into the lungs 2 (two) times daily.   doxycycline 100 MG tablet Commonly known as: VIBRA-TABS Take 1 tablet (100 mg total) by mouth 2 (two) times daily for 2 days.   ipratropium-albuterol 0.5-2.5 (3) MG/3ML Soln Commonly known as: DUONEB Take 3 mLs by nebulization 4 (four) times daily.   metoprolol tartrate 25 MG tablet Commonly known as: LOPRESSOR Take 1 tablet (25 mg total) by mouth 2 (two) times daily.   montelukast 10 MG tablet Commonly known as: SINGULAIR Take 10 mg by mouth at bedtime.   predniSONE 20 MG tablet Commonly known as: DELTASONE Take 3 tablet by mouth daily x1 day; then 2 tablets by mouth daily x2 days; then 1 tablet by mouth daily x2 days; then half tablet by mouth daily x3 days and stop prednisone. What changed: additional instructions   torsemide 20 MG tablet Commonly known as: DEMADEX Take 2 tablets by mouth in the morning and 1 tablet by mouth in the evening. What changed:   how much to take  how to take this  when to take this  additional instructions      No Known Allergies  Follow-up Information    Asencion Noble, MD. Schedule an appointment as soon as possible for a visit in 10 day(s).   Specialty: Internal Medicine Contact information: [address] [place] [address] [phone]               The results of significant diagnostics from this hospitalization (including imaging, microbiology, ancillary and laboratory) are listed below for reference.    Significant Diagnostic Studies: US Venous Img Lower Bilateral (DVT)  Result Date: 01/24/2020 CLINICAL DATA:  Bilateral lower extremity swelling EXAM: BILATERAL LOWER EXTREMITY VENOUS DOPPLER ULTRASOUND TECHNIQUE: Gray-scale sonography with compression, as well as color and duplex ultrasound, were performed to  evaluate the deep venous system(s) from the level of the common femoral vein through the popliteal and proximal calf veins. COMPARISON:  None. FINDINGS: VENOUS Normal compressibility of the common femoral, superficial femoral, and popliteal veins, as well as the visualized calf veins. Visualized portions of profunda femoral vein and great saphenous vein unremarkable. No filling defects to suggest DVT on grayscale or color Doppler imaging. Doppler waveforms show normal direction of venous flow, normal respiratory plasticity and response to augmentation. OTHER None. Limitations: none IMPRESSION: Negative. Electronically Signed   By: Rolm Baptise M.D.   On: 01/24/2020 12:10   DG Chest Portable 1 View  Result Date: 01/23/2020 CLINICAL DATA:  Shortness of breath. EXAM: PORTABLE CHEST 1 VIEW COMPARISON:  May 04, 2019 FINDINGS: The heart size and mediastinal contours are within normal limits. Both lungs are clear. The visualized skeletal structures are unremarkable. IMPRESSION: No active disease. Electronically Signed   By: Virgina Norfolk M.D.   On: 01/23/2020 15:50   ECHOCARDIOGRAM COMPLETE  Result Date: 01/25/2020    ECHOCARDIOGRAM REPORT   Patient Name:   JAIZON DEROOS Date of Exam: 01/25/2020 Medical Rec #:  [id number]        Height:       69.0 in Accession #:    [id number]       Weight:       292.5 lb Date of  Birth:  [birth year]        BSA:          2.428 m Patient Age:    33 years         BP:           99/56 mmHg Patient Gender: M                HR:           93 bpm. Exam Location:  [facility] Procedure: 2D Echo Indications:    CHF-Acute Diastolic 782.95 / A21.30  History:        Patient has prior history of Echocardiogram examinations, most                 recent 08/13/2019. COPD, Signs/Symptoms:Dyspnea; Risk                 Factors:Hypertension and Former Smoker. Prostate Cancer, GERD,                 Acute Renal Failure.  Sonographer:    Leavy Cella RDCS (AE) Referring Phys: [phone] Royanne Foots Baker  1. Left ventricular ejection fraction, by estimation, is 60 to 65%. The left ventricle has normal function. The left ventricle has no regional wall motion abnormalities. There is moderate left ventricular hypertrophy. Left ventricular diastolic parameters are indeterminate.  2. Right ventricular systolic function is normal. The right ventricular size is normal. Tricuspid regurgitation signal is inadequate for assessing PA pressure.  3. A small pericardial effusion is present. The pericardial effusion is anterior to the right ventricle.  4. The mitral valve is grossly normal. Trivial mitral valve regurgitation.  5. The aortic valve is tricuspid. Aortic valve regurgitation is not visualized.  6. The inferior vena cava is normal in size with greater than 50% respiratory variability, suggesting right atrial pressure of 3 mmHg. FINDINGS  Left Ventricle: Left ventricular ejection fraction, by estimation, is 60 to 65%. The left ventricle has normal function. The left ventricle has no regional wall motion abnormalities. The left ventricular internal cavity size was normal in size. There is  moderate left ventricular hypertrophy. Left ventricular diastolic parameters are indeterminate. Right Ventricle: The right ventricular size is normal. No increase in right ventricular wall thickness. Right ventricular systolic function is normal. Tricuspid regurgitation signal is inadequate for assessing PA pressure. Left Atrium: Left atrial size was normal in size. Right Atrium: Right atrial size was normal in size. Pericardium: A small pericardial effusion is present. The pericardial effusion is anterior to the right ventricle. Presence of pericardial fat pad. Mitral Valve: The mitral valve is grossly normal. Trivial mitral valve regurgitation. Tricuspid Valve: The tricuspid valve is grossly normal. Tricuspid valve regurgitation is trivial. Aortic Valve: The aortic valve is tricuspid. There is mild aortic valve annular  calcification. Aortic valve regurgitation is not visualized. Pulmonic Valve: The pulmonic valve was grossly normal. Pulmonic valve regurgitation is trivial. Aorta: The aortic root is normal in size and structure. Venous: The inferior vena cava is normal in size with greater than 50% respiratory variability, suggesting right atrial pressure of 3 mmHg. IAS/Shunts: No atrial level shunt detected by color flow Doppler.  LEFT VENTRICLE PLAX 2D LVIDd:         5.03 cm  Diastology LVIDs:         3.40 cm  LV e' medial:    7.29 cm/s LV PW:         1.40 cm  LV E/e' medial:  14.7  LV IVS:        1.37 cm  LV e' lateral:   11.70 cm/s LVOT diam:     2.10 cm  LV E/e' lateral: 9.1 LVOT Area:     3.46 cm  RIGHT VENTRICLE RV S prime:     21.60 cm/s TAPSE (M-mode): 2.4 cm LEFT ATRIUM             Index       RIGHT ATRIUM           Index LA diam:        3.80 cm 1.56 cm/m  RA Area:     11.60 cm LA Vol (A2C):   54.8 ml 22.57 ml/m RA Volume:   25.10 ml  10.34 ml/m LA Vol (A4C):   32.1 ml 13.22 ml/m LA Biplane Vol: 43.6 ml 17.95 ml/m   AORTA Ao Root diam: 3.40 cm MITRAL VALVE MV Area (PHT): 5.06 cm     SHUNTS MV Decel Time: 150 msec     Systemic Diam: 2.10 cm MV E velocity: 107.00 cm/s MV A velocity: 77.10 cm/s MV E/A ratio:  1.39 Rozann Lesches MD Electronically signed by Rozann Lesches MD Signature Date/Time: 01/25/2020/11:37:07 AM    Final     Microbiology: Recent Results (from the past 240 hour(s))  Resp Panel by RT-PCR (Flu A&B, Covid) Nasopharyngeal Swab     Status: None   Collection Time: 01/23/20  3:09 PM   Specimen: Nasopharyngeal Swab; Nasopharyngeal(NP) swabs in vial transport medium  Result Value Ref Range Status   SARS Coronavirus 2 by RT PCR NEGATIVE NEGATIVE Final    Comment: (NOTE) SARS-CoV-2 target nucleic acids are NOT DETECTED.  The SARS-CoV-2 RNA is generally detectable in upper respiratory specimens during the acute phase of infection. The lowest concentration of SARS-CoV-2 viral copies this assay  can detect is 138 copies/mL. A negative result does not preclude SARS-Cov-2 infection and should not be used as the sole basis for treatment or other patient management decisions. A negative result may occur with  improper specimen collection/handling, submission of specimen other than nasopharyngeal swab, presence of viral mutation(s) within the areas targeted by this assay, and inadequate number of viral copies(<138 copies/mL). A negative result must be combined with clinical observations, patient history, and epidemiological information. The expected result is Negative.  Fact Sheet for Patients:  EntrepreneurPulse.com.au  Fact Sheet for Healthcare Providers:  IncredibleEmployment.be  This test is no t yet approved or cleared by the [place] FDA and  has been authorized for detection and/or diagnosis of SARS-CoV-2 by FDA under an Emergency Use Authorization (EUA). This EUA will remain  in effect (meaning this test can be used) for the duration of the COVID-19 declaration under Section 564(b)(1) of the Act, 21 U.S.C.section 360bbb-3(b)(1), unless the authorization is terminated  or revoked sooner.       Influenza A by PCR NEGATIVE NEGATIVE Final   Influenza B by PCR NEGATIVE NEGATIVE Final    Comment: (NOTE) The Xpert Xpress SARS-CoV-2/FLU/RSV plus assay is intended as an aid in the diagnosis of influenza from Nasopharyngeal swab specimens and should not be used as a sole basis for treatment. Nasal washings and aspirates are unacceptable for Xpert Xpress SARS-CoV-2/FLU/RSV testing.  Fact Sheet for Patients: EntrepreneurPulse.com.au  Fact Sheet for Healthcare Providers: IncredibleEmployment.be  This test is not yet approved or cleared by the [place] FDA and has been authorized for detection and/or diagnosis of SARS-CoV-2 by FDA under an Emergency Use Authorization (EUA). This EUA  will  remain in effect (meaning this test can be used) for the duration of the COVID-19 declaration under Section 564(b)(1) of the Act, 21 U.S.C. section 360bbb-3(b)(1), unless the authorization is terminated or revoked.  Performed at [facility], [address], [place], [place] [address]      Labs: Basic Metabolic Panel: Recent Labs  Lab 01/23/20 1536 01/24/20 0447 01/25/20 0541  NA 137 137 136  K 3.5 4.0 4.1  CL 99 97* 100  CO2 27 25 26   GLUCOSE 103* 149* 148*  BUN 16 25* 37*  CREATININE 1.77* 2.10* 1.94*  CALCIUM 9.3 9.8 9.3  MG 2.1  --  2.4  PHOS 4.0  --   --    CBC: Recent Labs  Lab 01/23/20 1536 01/25/20 0541  WBC 10.1 21.4*  NEUTROABS 6.2  --   HGB 14.3 13.6  HCT 43.5 42.1  MCV 89.3 91.3  PLT 301 344    BNP (last 3 results) Recent Labs    05/04/19 1100 01/24/20 0447  BNP 34.0 782.0*   CBG: Recent Labs  Lab 01/26/20 1127 01/26/20 1614 01/26/20 2136 01/27/20 0803 01/27/20 1153  GLUCAP 164* 100* 109* 127* 92   Signed:  Barton Dubois MD.  [facility] 01/27/2020, 3:04 PM

## 2020-01-27 NOTE — Plan of Care (Signed)
  Problem: Acute Rehab PT Goals(only PT should resolve) Goal: Pt Will Ambulate Outcome: Completed/Met Goal: Pt Will Go Up/Down Stairs Outcome: Completed/Met Goal: PT Additional Goal #1 Outcome: Completed/Met Note: Does not require AD  9:32 AM, 01/27/20 M. Sherlyn Lees, PT, DPT Physical Therapist- [place] Office Number: [phone]

## 2020-01-27 NOTE — Progress Notes (Signed)
AVS discussed with patient. All new medications and follow up appointments reviewed. Patient voiced understanding. IV access removed. Dressing applied. All patient belongings with patient.  Patient to be transported home via private vehicle with brother in law.

## 2020-01-27 NOTE — Progress Notes (Signed)
Physical Therapy Treatment Patient Details Name: Thomas [name] MRN: [id number] DOB: 1960/04/26 Today's Date: 01/27/2020    History of Present Illness Thomas [name] is a 59 y.o. male with medical history significant of osteoarthritis, COPD, GERD, hypertension, urolithiasis, lateral meniscus and medial meniscus tear of right knee, class III obesity, OSA not on CPAP, prostate cancer, thyroid nodule who is coming to the emergency department due to progressively worse shortness of breath since Friday.  He denies fever, rhinorrhea, sore throat, but states he has been coughing, which is occasionally productive and occasionally produces pleuritic chest pain.  Dyspnea has been affecting his sleep hours.  No changes in appetite.  He denies dizziness, diaphoresis, palpitations, exertional chest pain, but states he has been having swelling of the lower extremities over the past month.  He denies abdominal pain, nausea, vomiting, diarrhea, constipation, melena or hematochezia.  No dysuria, frequency or hematuria.  Denies polyuria, polydipsia or blurred vision.    PT Comments    Patient demonstrates improved functional performance on this date and is able to ambulate in excess of 700 ft on room air and maintaining saturation levels 93-97% with HR of 98-101 bpm and does not demonstrate undue exertion/fatigue.  Patient able to self-pace activities as needed and does not demonstrate fall risk or balance deficits.  Since patient able to maintain sat level he has been encouraged by this therapist to ambulate ad lib in the hallways to increase activity level with NSG staff educated on CLOF and to perform sat checks as needed during ambulation bouts.  Patient discharged to care of nursing for ambulation daily as tolerated for length of stay.   Follow Up Recommendations  No PT follow up     Equipment Recommendations  None recommended by PT    Recommendations for Other Services       Precautions /  Restrictions Precautions Precautions: None Restrictions Weight Bearing Restrictions: No    Mobility  Bed Mobility Overal bed mobility: Independent                Transfers Overall transfer level: Independent                  Ambulation/Gait Ambulation/Gait assistance: Independent Gait Distance (Feet): 700 Feet Assistive device: None Gait Pattern/deviations: WFL(Within Functional Limits)   Gait velocity interpretation: >2.62 ft/sec, indicative of community ambulatory     Stairs Stairs: Yes Stairs assistance: Modified independent (Device/Increase time) Stair Management: One rail Right Number of Stairs: 10 General stair comments: standing rest periods at top of stairs ad lib   Wheelchair Mobility    Modified Rankin (Stroke Patients Only)       Balance                                            Cognition Arousal/Alertness: Awake/alert Behavior During Therapy: WFL for tasks assessed/performed Overall Cognitive Status: Within Functional Limits for tasks assessed                                        Exercises      General Comments        Pertinent Vitals/Pain Pain Assessment: No/denies pain    Home Living  Prior Function            PT Goals (current goals can now be found in the care plan section) Acute Rehab PT Goals Patient Stated Goal: Be able to breathe when I'm up and walking PT Goal Formulation: With patient Time For Goal Achievement: 01/27/20 Potential to Achieve Goals: Good Additional Goals Additional Goal #1: Patient will demo safe and effective use of AD to improve ambulation tolerance maintaining saturation level >90% with exertion Progress towards PT goals: Goals met/education completed, patient discharged from PT    Frequency           PT Plan Other (comment) (all goals met.)    Co-evaluation              AM-PAC PT "6 Clicks" Mobility    Outcome Measure  Help needed turning from your back to your side while in a flat bed without using bedrails?: None Help needed moving from lying on your back to sitting on the side of a flat bed without using bedrails?: None Help needed moving to and from a bed to a chair (including a wheelchair)?: None Help needed standing up from a chair using your arms (e.g., wheelchair or bedside chair)?: None Help needed to walk in hospital room?: None Help needed climbing 3-5 steps with a railing? : None 6 Click Score: 24    End of Session   Activity Tolerance: Patient tolerated treatment well (able to perform well maintaining O2 sat 93-96% with exertion) Patient left: in chair Nurse Communication: Mobility status PT Visit Diagnosis: Other abnormalities of gait and mobility (R26.89);Difficulty in walking, not elsewhere classified (R26.2);Unsteadiness on feet (R26.81)     Time: 9780-2089 PT Time Calculation (min) (ACUTE ONLY): 32 min  Charges:  $Gait Training: 23-37 mins                     9:31 AM, 01/27/20 M. Sherlyn Lees, PT, DPT Physical Therapist- [place] Office Number: [phone]

## 2020-03-29 ENCOUNTER — Ambulatory Visit: Payer: 59 | Attending: Internal Medicine

## 2020-03-29 DIAGNOSIS — Z23 Encounter for immunization: Secondary | ICD-10-CM

## 2020-03-29 NOTE — Progress Notes (Signed)
   Covid-19 Vaccination Clinic  Name:  BRITAIN SABER    MRN: [id number] DOB: [birth year]  03/29/2020  Mr. Dorner was observed post Covid-19 immunization for 15 minutes without incident. He was provided with Vaccine Information Sheet and instruction to access the V-Safe system.   Mr. Furtick was instructed to call 911 with any severe reactions post vaccine: Marland Kitchen Difficulty breathing  . Swelling of face and throat  . A fast heartbeat  . A bad rash all over body  . Dizziness and weakness   Immunizations Administered    Name Date Dose VIS Date Route   Moderna Covid-19 Booster Vaccine 03/29/2020  3:04 PM 0.25 mL 12/21/2019 Intramuscular   Manufacturer: Moderna   Lot: [id number]   Dwight: [id number]

## 2020-07-31 ENCOUNTER — Encounter: Payer: Self-pay | Admitting: Internal Medicine

## 2020-07-31 ENCOUNTER — Ambulatory Visit (INDEPENDENT_AMBULATORY_CARE_PROVIDER_SITE_OTHER): Payer: 59 | Admitting: Internal Medicine

## 2020-07-31 ENCOUNTER — Other Ambulatory Visit: Payer: Self-pay

## 2020-07-31 DIAGNOSIS — J449 Chronic obstructive pulmonary disease, unspecified: Secondary | ICD-10-CM

## 2020-07-31 DIAGNOSIS — R059 Cough, unspecified: Secondary | ICD-10-CM

## 2020-07-31 MED ORDER — BREZTRI AEROSPHERE 160-9-4.8 MCG/ACT IN AERO: 2.0000 | INHALATION_SPRAY | Freq: Two times a day (BID) | RESPIRATORY_TRACT | 0 refills | Status: DC

## 2020-07-31 NOTE — Assessment & Plan Note (Addendum)
Quit smoking 1995 at wt 215  - pfts 10/27/2018 nl including f/v loop x for EV 48% at wt = 277  - 07/31/2020  After extensive coaching inhaler device,  effectiveness =    80% so try change from trelegy to breztri 2bid   DDX of  difficult airways management almost all start with A and  include Adherence, Ace Inhibitors, Acid Reflux, Active Sinus Disease, Alpha 1 Antitripsin deficiency, Anxiety masquerading as Airways dz,  ABPA,  Allergy(esp in young), Aspiration (esp in elderly), Adverse effects of meds,  Active smoking or vaping, A bunch of PE's (a small clot burden can't cause this syndrome unless there is already severe underlying pulm or vascular dz with poor reserve) plus two Bs  = Bronchiectasis and Beta blocker use..and one C= CHF   Adherence is always the initial "prime suspect" and is a multilayered concern that requires a "trust but verify" approach in every patient - starting with knowing how to use medications, especially inhalers, correctly, keeping up with refills and understanding the fundamental difference between maintenance and prns vs those medications only taken for a very short course and then stopped and not refilled.  - over using saba at baseline - see hfa teaching - return 2 weeks with all meds in hand using a trust but verify approach to confirm accurate Medication  Reconciliation The principal here is that until we are certain that the  patients are doing what we've asked, it makes no sense to ask them to do more.   ? Adverse drug effects > d/c trelegy   ? Allergy > suggested by pred dependence but presently on pred so wean off and stay on high dose ics (breztri) plus singulair then return for allergy profiel  ? Sinus dz assoc with dental dz > sinus ct now    ? chf > prob diastolic dysfunction by last echo 01/25/2020

## 2020-07-31 NOTE — Patient Instructions (Addendum)
For cough/ congestion > mucine 1200 mg every 12 hours  Plan A = Automatic = Always=    Breztri Take 2 puffs first thing in am and then another 2 puffs about 12 hours later.    Work on inhaler technique:  relax and gently blow all the way out then take a nice smooth full deep breath back in, triggering the inhaler at same time you start breathing in.  Hold for up to 5 seconds if you can. Blow out thru nose. Rinse and gargle with water when done.  If mouth or throat bother you at all,  try brushing teeth/gums/tongue with arm and hammer toothpaste/ make a slurry and gargle and spit out.    Finish up your prednisone   Plan B = Backup (to supplement plan A, not to replace it) Only use your albuterol inhaler as a rescue medication to be used if you can't catch your breath by resting or doing a relaxed purse lip breathing pattern.  - The less you use it, the better it will work when you need it. - Ok to use the inhaler up to 2 puffs  every 4 hours if you must but call for appointment if use goes up over your usual need - Don't leave home without it !!  (think of it like the spare tire for your car)   Plan C = Crisis (instead of Plan B but only if Plan B stops working) - only use your albuterol nebulizer if you first try Plan B and it fails to help > ok to use the nebulizer up to every 4 hours but if start needing it regularly call for immediate appointment   GERD (REFLUX)  is an extremely common cause of respiratory symptoms just like yours , many times with no obvious heartburn at all.    It can be treated with medication, but also with lifestyle changes including elevation of the head of your bed (ideally with 6-8inch blocks under the headboard of your bed),  Smoking cessation, avoidance of late meals, excessive alcohol, and avoid fatty foods, chocolate, peppermint, colas, red wine, and acidic juices such as orange juice.  NO MINT OR MENTHOL PRODUCTS SO NO COUGH DROPS  USE SUGARLESS CANDY INSTEAD  (Jolley ranchers or Stover's or Life Savers) or even ice chips will also do - the key is to swallow to prevent all throat clearing. NO OIL BASED VITAMINS - use powdered substitutes.  Avoid fish oil when coughing.   Stop trelegy   We will call to schedule a sinus CT     Please schedule a follow up office visit in 2 weeks, sooner if needed  with all medications /inhalers/ solutions in hand so we can verify exactly what you are taking. This includes all medications from all doctors and over the counters

## 2020-07-31 NOTE — Assessment & Plan Note (Addendum)
ERV 48% at wt 277   Body mass index is 42.04 kg/m.  -  trending up  No results found for: TSH   Contributing to  Doe and gerd/dvt risk /reviewed the need and the process to achieve and maintain neg calorie balance > defer f/u primary care including intermittently monitoring thyroid status          Each maintenance medication was reviewed in detail including emphasizing most importantly the difference between maintenance and prns and under what circumstances the prns are to be triggered using an action plan format where appropriate.  Total time for H and P, chart review, counseling, reviewing hfa device(s) and generating customized AVS unique to this office visit with pt new to me / same day charting > 40 min

## 2020-07-31 NOTE — Progress Notes (Signed)
Thomas [name], male    DOB: 1960/09/20,     MRN: [id number]   Brief patient profile:  60 yowm quit smoking 1995 at wt  215  former pt of Byrum with AB with no copd by pfts 10/2018 and working dx of AB.      History of Present Illness  07/31/2020  Pulmonary/ 1st office eval/ Mcdonald Reiling / [facility]  Chief Complaint  Patient presents with  . Follow-up    Former pt of [name]. He states that his breathing has been progressively worse since the last visit in 2020. He states he has been on pred and doxy off and on and these help but only temporarily. He is currently on 40 mg pred daily. He has some prod cough with white to brown sputum and also wheezing. He is using his albuterol inhaler 2 x per wk and has duoneb he uses 3-4 x per day.   Dyspnea:  On good days can across the parking lot/  [employer] shopping / incline to MB s stopping while on trelegy/ and daily neb albuterol worse than usual since March 2021 with flares every few weeks unless stays on prednisone  Cough: esp in am dark yellow assoc with intermittent nasal discharge / poor dentition  Sleep: bed is flat right side down SABA use: neb  No 02   No obvious day to day or daytime variability or assoc   mucus plugs or hemoptysis or cp or chest tightness,  or overt   hb symptoms.   Sleeping  without nocturnal  or early am exacerbation  of respiratory  c/o's or need for noct saba. Also denies any obvious fluctuation of symptoms with weather or environmental changes or other aggravating or alleviating factors except as outlined above   No unusual exposure hx or h/o childhood pna/ asthma or knowledge of premature birth.  Current Allergies, Complete Past Medical History, Past Surgical History, Family History, and Social History were reviewed in [employer] record.  ROS  The following are not active complaints unless bolded Hoarseness, sore throat, dysphagia, dental problems, itching, sneezing,  nasal congestion  or discharge of excess mucus or purulent secretions, ear ache,   fever, chills, sweats, unintended wt loss or wt gain, classically pleuritic or exertional cp,  orthopnea pnd or arm/hand swelling  or leg swelling, presyncope, palpitations, abdominal pain, anorexia, nausea, vomiting, diarrhea  or change in bowel habits or change in bladder habits, change in stools or change in urine, dysuria, hematuria,  rash, arthralgias, visual complaints, headache, numbness, weakness or ataxia or problems with walking or coordination,  change in mood or  memory.           Past Medical History:  Diagnosis Date  . Arthritis    "touch in my neck" (09-16-2012)  . COPD (chronic obstructive pulmonary disease) ([place])   . GERD (gastroesophageal reflux disease)   . Hypertension   . Kidney stones    "passed on their own" (September 16, 2012)  . Lateral meniscus tear   . Medial meniscus tear   . Obesity, Class III, BMI 40-49.9 (morbid obesity) ([place]) 05/05/2019  . OSA (obstructive sleep apnea)    "cannot sleep w/mask" (09/16/12)  . Prostate cancer ([place])   . Shortness of breath    "can happen at anytime" (September 16, 2012)  . Thyroid nodule    bilaterally/notes Sep 16, 2012    Outpatient Medications Prior to Visit  Medication Sig Dispense Refill  . acetaminophen (TYLENOL) 500 MG tablet Take 500  mg by mouth every 6 (six) hours as needed for moderate pain.    . Albuterol Sulfate 108 (90 Base) MCG/ACT AEPB Inhale 2 puffs into the lungs every 6 (six) hours as needed (sob, wheeze). 1 each 1  . allopurinol (ZYLOPRIM) 300 MG tablet Take 150 mg by mouth daily.    . Fluticasone-Umeclidin-Vilant (TRELEGY ELLIPTA) 100-62.5-25 MCG/INH AEPB Inhale 1 puff into the lungs daily.    Marland Kitchen ipratropium-albuterol (DUONEB) 0.5-2.5 (3) MG/3ML SOLN Take 3 mLs by nebulization 4 (four) times daily. 360 mL 0  . metoprolol tartrate (LOPRESSOR) 25 MG tablet Take 1 tablet (25 mg total) by mouth 2 (two) times daily. 60 tablet 1  . montelukast (SINGULAIR) 10 MG tablet  Take 10 mg by mouth at bedtime.    . predniSONE (DELTASONE) 20 MG tablet Take 20 mg by mouth 2 (two) times daily with a meal.    . torsemide (DEMADEX) 20 MG tablet Take 20 mg by mouth daily.    . budesonide-formoterol (SYMBICORT) 160-4.5 MCG/ACT inhaler Inhale 2 puffs into the lungs 2 (two) times daily. 10.2 g 1  . predniSONE (DELTASONE) 20 MG tablet Take 3 tablet by mouth daily x1 day; then 2 tablets by mouth daily x2 days; then 1 tablet by mouth daily x2 days; then half tablet by mouth daily x3 days and stop prednisone. 15 tablet 0  . torsemide (DEMADEX) 20 MG tablet Take 2 tablets by mouth in the morning and 1 tablet by mouth in the evening. (Patient taking differently: Take 20 mg by mouth daily.) 90 tablet 1       Objective:     BP 128/86 (BP Location: Left Arm, Cuff Size: Normal)   Pulse 88   Temp (!) 97.2 F (36.2 C) (Temporal)   Ht 5\' 10"  (1.778 m)   Wt 293 lb (132.9 kg)   SpO2 96% Comment: on RA  BMI 42.04 kg/m   SpO2: 96 % (on RA)   Wt Readings from Last 3 Encounters:  07/31/20 293 lb (132.9 kg)  01/27/20 290 lb 9 oz (131.8 kg)  05/04/19 288 lb 9.3 oz (130.9 kg)    Morbidly Obese pleasant amb wm nad   HEENT : pt wearing mask not removed for exam due to covid -19 concerns.    NECK :  without JVD/Nodes/TM/ nl carotid upstrokes bilaterally   LUNGS: no acc muscle use,  Nl contour chest distant bilateral pan exp rhonchi  without cough on insp or exp maneuvers   CV:  RRR  no s3 or murmur or increase in P2, and trace to 1+ sym LE  edema   ABD:  soft and nontender with nl inspiratory excursion in the supine position. No bruits or organomegaly appreciated, bowel sounds nl  MS:  Nl gait/ ext warm without deformities, calf tenderness, cyanosis or clubbing No obvious joint restrictions   SKIN: warm and dry without lesions    NEURO:  alert, approp, nl sensorium with  no motor or cerebellar deficits apparent.     I personally reviewed images and agree with radiology  impression as follows:  CXR:   Portable 01/23/20 No active disease.     Assessment   Asthmatic bronchitis , chronic (HCC) Quit smoking 1995 at wt 215  - pfts 10/27/2018 nl including f/v loop x for EV 48% at wt = 277  - 07/31/2020  After extensive coaching inhaler device,  effectiveness =    80% so try change from trelegy to breztri 2bid   DDX of  difficult airways management almost all start with A and  include Adherence, Ace Inhibitors, Acid Reflux, Active Sinus Disease, Alpha 1 Antitripsin deficiency, Anxiety masquerading as Airways dz,  ABPA,  Allergy(esp in young), Aspiration (esp in elderly), Adverse effects of meds,  Active smoking or vaping, A bunch of PE's (a small clot burden can't cause this syndrome unless there is already severe underlying pulm or vascular dz with poor reserve) plus two Bs  = Bronchiectasis and Beta blocker use..and one C= CHF   Adherence is always the initial "prime suspect" and is a multilayered concern that requires a "trust but verify" approach in every patient - starting with knowing how to use medications, especially inhalers, correctly, keeping up with refills and understanding the fundamental difference between maintenance and prns vs those medications only taken for a very short course and then stopped and not refilled.  - over using saba at baseline - see hfa teaching - return 2 weeks with all meds in hand using a trust but verify approach to confirm accurate Medication  Reconciliation The principal here is that until we are certain that the  patients are doing what we've asked, it makes no sense to ask them to do more.   ? Adverse drug effects > d/c trelegy   ? Allergy > suggested by pred dependence but presently on pred so wean off and stay on high dose ics (breztri) plus singulair then return for allergy profiel  ? Sinus dz assoc with dental dz > sinus ct now    ? chf > prob diastolic dysfunction by last echo 01/25/2020     Obesity, Class III,  BMI 40-49.9 (morbid obesity) ([place]) ERV 48% at wt 277   Body mass index is 42.04 kg/m.  -  trending up  No results found for: TSH   Contributing to  Doe and gerd/dvt risk /reviewed the need and the process to achieve and maintain neg calorie balance > defer f/u primary care including intermittently monitoring thyroid status          Each maintenance medication was reviewed in detail including emphasizing most importantly the difference between maintenance and prns and under what circumstances the prns are to be triggered using an action plan format where appropriate.  Total time for H and P, chart review, counseling, reviewing hfa device(s) and generating customized AVS unique to this office visit with pt new to me / same day charting > 40 min           Christinia Gully, MD 07/31/2020

## 2020-08-09 ENCOUNTER — Other Ambulatory Visit: Payer: Self-pay

## 2020-08-09 ENCOUNTER — Ambulatory Visit (HOSPITAL_COMMUNITY)
Admission: RE | Admit: 2020-08-09 | Discharge: 2020-08-09 | Disposition: A | Discharge status: Home / Self Care | Payer: 59 | Source: Ambulatory Visit | Attending: Internal Medicine | Admitting: Internal Medicine

## 2020-08-09 DIAGNOSIS — R059 Cough, unspecified: Secondary | ICD-10-CM | POA: Insufficient documentation

## 2020-08-09 DIAGNOSIS — J449 Chronic obstructive pulmonary disease, unspecified: Secondary | ICD-10-CM | POA: Insufficient documentation

## 2020-08-14 ENCOUNTER — Telehealth: Payer: Self-pay | Admitting: Internal Medicine

## 2020-08-14 DIAGNOSIS — J449 Chronic obstructive pulmonary disease, unspecified: Secondary | ICD-10-CM

## 2020-08-14 DIAGNOSIS — J309 Allergic rhinitis, unspecified: Secondary | ICD-10-CM

## 2020-08-14 MED ORDER — AMOXICILLIN-POT CLAVULANATE 875-125 MG PO TABS: 1.0000 | ORAL_TABLET | Freq: Two times a day (BID) | ORAL | 0 refills | Status: DC

## 2020-08-14 MED ORDER — BREZTRI AEROSPHERE 160-9-4.8 MCG/ACT IN AERO: 2.0000 | INHALATION_SPRAY | Freq: Two times a day (BID) | RESPIRATORY_TRACT | 3 refills | Status: DC

## 2020-08-14 NOTE — Telephone Encounter (Signed)
Yes, if Dr. [name] had him on stable regimen of Breztri then I am happy to sign the prescription in his absence.

## 2020-08-14 NOTE — Telephone Encounter (Signed)
Call returned to patient, made aware script has been printed and signed and placed upfront for pick up. Patient states he lives in [place]. I made him aware Dr [name] is not in the [facility] today so we had another provider sign the prescription for him. I made him aware he could come get it or have it mailed. Patient requested it be mailed. Voiced understanding. Placed upfront to be mailed.   Nothing further needed at this time.

## 2020-08-14 NOTE — Telephone Encounter (Signed)
I called and spoke with patient regarding CT sinus and Dr. [name] recs. Patient verbalized understanding and I sent in Augmentin and and ref to ENT and informed patient they will call to make an appt and patient stated he has a dentist and will follow up with them. Patient is needing a refill on Breztri but is using an outside pharmacy since its cheaper and needs a printed script. Dr .[name] is out of the office until 08/20/20 but patient saw Dr. Lamonte Sakai and asking if he will sign the script since he is in office so he doesn't run out. I informed patient I will ask him and let him know. Routing to Dr. Lamonte Sakai.  Dr. Lamonte Sakai, please advise if you are ok signing script or does he need to wait for Dr. [name]. Thanks!

## 2020-08-22 ENCOUNTER — Encounter: Payer: Self-pay | Admitting: Internal Medicine

## 2020-08-22 ENCOUNTER — Ambulatory Visit (INDEPENDENT_AMBULATORY_CARE_PROVIDER_SITE_OTHER): Payer: 59 | Admitting: Internal Medicine

## 2020-08-22 ENCOUNTER — Other Ambulatory Visit: Payer: Self-pay

## 2020-08-22 DIAGNOSIS — J449 Chronic obstructive pulmonary disease, unspecified: Secondary | ICD-10-CM | POA: Diagnosis not present

## 2020-08-22 MED ORDER — AMOXICILLIN-POT CLAVULANATE 875-125 MG PO TABS: 1.0000 | ORAL_TABLET | Freq: Two times a day (BID) | ORAL | 0 refills | Status: DC

## 2020-08-22 MED ORDER — PREDNISONE 10 MG PO TABS: ORAL_TABLET | ORAL | 0 refills | Status: DC

## 2020-08-22 MED ORDER — ALBUTEROL SULFATE (2.5 MG/3ML) 0.083% IN NEBU
2.5000 mg | INHALATION_SOLUTION | RESPIRATORY_TRACT | 12 refills | Status: DC | PRN
Start: 2020-08-22 — End: 2021-10-02

## 2020-08-22 MED ORDER — BREZTRI AEROSPHERE 160-9-4.8 MCG/ACT IN AERO: INHALATION_SPRAY | RESPIRATORY_TRACT | 3 refills | Status: DC

## 2020-08-22 MED ORDER — BREZTRI AEROSPHERE 160-9-4.8 MCG/ACT IN AERO: 2.0000 | INHALATION_SPRAY | Freq: Two times a day (BID) | RESPIRATORY_TRACT | 0 refills | Status: DC

## 2020-08-22 NOTE — Progress Notes (Addendum)
Thomas [name], male    DOB: Jul 05, 1960,     MRN: [id number]   Brief patient profile:  60 yowm quit smoking 1995 at wt  215  former pt of Byrum with AB with no copd by pfts 10/2018 and working dx of AB.      History of Present Illness  07/31/2020  Pulmonary/ 1st office eval/ Illeana Edick / [facility]  Chief Complaint  Patient presents with   Follow-up    Former pt of [name]. He states that his breathing has been progressively worse since the last visit in 2020. He states he has been on pred and doxy off and on and these help but only temporarily. He is currently on 40 mg pred daily. He has some prod cough with white to brown sputum and also wheezing. He is using his albuterol inhaler 2 x per wk and has duoneb he uses 3-4 x per day.   Dyspnea:  On good days can across the parking lot/  [employer] shopping / incline to MB s stopping while on trelegy/ and daily neb albuterol worse than usual since March 2021 with flares every few weeks unless stays on prednisone  Cough: esp in am dark yellow assoc with intermittent nasal discharge / poor dentition  Sleep: bed is flat right side down SABA use: neb  No 02  Rec For cough/ congestion > mucineX 1200 mg every 12 hours Plan A = Automatic = Always=    Breztri Take 2 puffs first thing in am and then another 2 puffs about 12 hours later.  Work on inhaler technique:  Finish up your prednisone  Plan B = Backup (to supplement plan A, not to replace it) Only use your albuterol inhaler as a rescue medication  Plan C = Crisis (instead of Plan B but only if Plan B stops working) - only use your albuterol nebulizer if you first try Plan B and it fails to help > ok to use the nebulizer up to every 4 hours but if start needing it regularly call for immediate appointment GERD diet  Stop trelegy We will call to schedule a sinus CT   1. Moderate inferior right maxillary sinus mucosal thickening, which is indeterminate but potential odontogenic given  adjacent carious right maxillary molar. Ossific fragment in the posterior/inferior right maxillary sinus may represent a tooth fragment. 2. Otherwise, mild paranasal sinus mucosal thickening with air-fluid level in the left maxillary sinus. 3. Evidence of prior endoscopic sinus surgery with right maxillary antrostomy and right ethmoidectomies  >  ENT/dental eval Please schedule a follow up office visit in 2 weeks, sooner if needed  with all medications /inhalers/ solutions in hand so we can verify exactly what you are taking. This includes all medications from all doctors and over the counters   08/22/2020  f/u ov/[place] office/Dashay Giesler re: worse sob/cough x 08/19/20 flare [name] eval pending for ongong sinusitis  Chief Complaint  Patient presents with   Follow-up    Cough with yellow sputum past 2-3 days. He is also more SOB and has been wheezing. He feels like leaning forward helps him to breathe better. He has had some swelling in his feet. He has used his rescue inhaler once since these symptoms started.     Dyspnea:  room to room Cough: congested yellow mucus x3 days  Sleeping: bed is flat with 1 one pillow rotated on side or prone  SABA use: none today  02: none  Covid status: x 2  No obvious patterns in day to day or daytime variability or assoc  mucus plugs or hemoptysis or cp or chest tightness, subjective wheeze or overt sinus or hb symptoms.   Sleeping  without nocturnal  or early am exacerbation  of respiratory  c/o's or need for noct saba. Also denies any obvious fluctuation of symptoms with weather or environmental changes or other aggravating or alleviating factors except as outlined above   No unusual exposure hx or h/o childhood pna/ asthma or knowledge of premature birth.  Current Allergies, Complete Past Medical History, Past Surgical History, Family History, and Social History were reviewed in [employer] record.  ROS  The following are not  active complaints unless bolded Hoarseness, sore throat, dysphagia, dental problems, itching, sneezing,  nasal congestion or discharge of excess mucus or purulent secretions, ear ache,   fever, chills, sweats, unintended wt loss or wt gain, classically pleuritic or exertional cp,  orthopnea pnd or arm/hand swelling  or leg swelling, presyncope, palpitations, abdominal pain, anorexia, nausea, vomiting, diarrhea  or change in bowel habits or change in bladder habits, change in stools or change in urine, dysuria, hematuria,  rash, arthralgias, visual complaints, headache, numbness, weakness or ataxia or problems with walking or coordination,  change in mood or  memory.        Current Meds  Medication Sig   acetaminophen (TYLENOL) 500 MG tablet Take 500 mg by mouth every 6 (six) hours as needed for moderate pain.   Albuterol Sulfate 108 (90 Base) MCG/ACT AEPB Inhale 2 puffs into the lungs every 6 (six) hours as needed (sob, wheeze).   allopurinol (ZYLOPRIM) 300 MG tablet Take 150 mg by mouth daily.   amoxicillin-clavulanate (AUGMENTIN) 875-125 MG tablet Take 1 tablet by mouth 2 (two) times daily.   Budeson-Glycopyrrol-Formoterol (BREZTRI AEROSPHERE) 160-9-4.8 MCG/ACT AERO Inhale 2 puffs into the lungs in the morning and at bedtime.   ipratropium-albuterol (DUONEB) 0.5-2.5 (3) MG/3ML SOLN Take 3 mLs by nebulization 4 (four) times daily.   metoprolol tartrate (LOPRESSOR) 25 MG tablet Take 1 tablet (25 mg total) by mouth 2 (two) times daily.   montelukast (SINGULAIR) 10 MG tablet Take 10 mg by mouth at bedtime.   torsemide (DEMADEX) 20 MG tablet Take 20 mg by mouth daily.                       Past Medical History:  Diagnosis Date   Arthritis    "touch in my neck" (09/12/12)   COPD (chronic obstructive pulmonary disease) (HCC)    GERD (gastroesophageal reflux disease)    Hypertension    Kidney stones    "passed on their own" (09/12/12)   Lateral meniscus tear    Medial meniscus tear     Obesity, Class III, BMI 40-49.9 (morbid obesity) ([place]) 05/05/2019   OSA (obstructive sleep apnea)    "cannot sleep w/mask" (2012/09/12)   Prostate cancer ([place])    Shortness of breath    "can happen at anytime" (2012-09-12)   Thyroid nodule    bilaterally/notes 2012-09-12        Objective:        08/22/2020        286   07/31/20 293 lb (132.9 kg)  01/27/20 290 lb 9 oz (131.8 kg)  05/04/19 288 lb 9.3 oz (130.9 kg)     Vital signs reviewed  08/22/2020  - Note at rest 02 sats  95% on RA   General appearance:  amb obese wm gruff voice / congested cough  HEENT : pt wearing mask not removed for exam due to covid - 19 concerns.   NECK :  without JVD/Nodes/TM/ nl carotid upstrokes bilaterally   LUNGS: no acc muscle use,  Min barrel  contour chest wall with  upper and lower airway bs wheeze and  without cough on insp or exp maneuvers and min  Hyperresonant  to  percussion bilaterally     CV:  RRR  no s3 or murmur or increase in P2, and trace to 1+  bilateral lower ext edema   ABD:  soft and nontender with pos end  insp Hoover's  in the supine position. No bruits or organomegaly appreciated, bowel sounds nl  MS:   Nl gait/  ext warm without deformities, calf tenderness, cyanosis or clubbing No obvious joint restrictions   SKIN: warm and dry without lesions    NEURO:  alert, approp, nl sensorium with  no motor or cerebellar deficits apparent.        Assessment

## 2020-08-22 NOTE — Patient Instructions (Addendum)
Plan A = Automatic = Always=    Breztri Take 2 puffs first thing in am and then another 2 puffs about 12 hours later.    Work on inhaler technique:  relax and gently blow all the way out then take a nice smooth full deep breath back in, triggering the inhaler at same time you start breathing in.  Hold for up to 5 seconds if you can. Blow out thru nose. Rinse and gargle with water when done.  If mouth or throat bother you at all,  try brushing teeth/gums/tongue with arm and hammer toothpaste/ make a slurry and gargle and spit out.       Plan B = Backup (to supplement plan A, not to replace it) Only use your albuterol inhaler as a rescue medication to be used if you can't catch your breath by resting or doing a relaxed purse lip breathing pattern.  - The less you use it, the better it will work when you need it. - Ok to use the inhaler up to 2 puffs  every 4 hours if you must but call for appointment if use goes up over your usual need - Don't leave home without it !!  (think of it like the spare tire for your car)   Plan C = Crisis (instead of Plan B but only if Plan B stops working) - only use your albuterol nebulizer if you first try Plan B and it fails to help > ok to use the nebulizer up to every 4 hours but if start needing it regularly call for immediate appointment    For cough > mucinex dm 1200 mg every 12 hours and use flutter valve as much as possible    Please schedule a follow up office visit in 6 weeks, call sooner if needed   Late add  : augmentin x 14 more days and keep teoh/ dental appts

## 2020-08-23 ENCOUNTER — Encounter: Payer: Self-pay | Admitting: Internal Medicine

## 2020-08-23 ENCOUNTER — Telehealth: Payer: Self-pay | Admitting: *Deleted

## 2020-08-23 NOTE — Telephone Encounter (Addendum)
-----   Message from Tanda Rockers, MD sent at 08/23/2020  5:49 AM EDT ----- Remind him I called him in 2 more weeks of augmentin but didn't list on avs  Called pt and there was no answer- LMTCB

## 2020-08-23 NOTE — Assessment & Plan Note (Addendum)
Quit smoking 1995 at wt 215  - pfts 10/27/2018 nl including f/v loop x for EV 48% at wt = 277  - 07/31/2020  After extensive coaching inhaler device,  effectiveness =    80% so try change from trelegy to breztri 2bid  - flare x 08/14/20 rx augmentin x 21 day and pred x 6 days   Need to extend augmentint to 21 days pending f/u with Dr Benjamine Mola as I suspect the sinusitis is destablizing his AB   Group D in terms of symptom/risk and laba/lama/ICS  therefore appropriate rx at this point >>>  Continue breztri plus flutter plus prn saba  Re saba: I spent extra time with pt today reviewing appropriate use of albuterol for prn use on exertion with the following points: 1) saba is for relief of sob that does not improve by walking a slower pace or resting but rather if the pt does not improve after trying this first. 2) If the pt is convinced, as many are, that saba helps recover from activity faster then it's easy to tell if this is the case by re-challenging : ie stop, take the inhaler, then p 5 minutes try the exact same activity (intensity of workload) that just caused the symptoms and see if they are substantially diminished or not after saba 3) if there is an activity that reproducibly causes the symptoms, try the saba 15 min before the activity on alternate days   If in fact the saba really does help, then fine to continue to use it prn but advised may need to look closer at the maintenance regimen being used to achieve better control of airways disease with exertion.

## 2020-08-23 NOTE — Telephone Encounter (Signed)
The one we just submitted yesterday was for 2 weeks to supplement the 10 days he's already had pending his f/u with ent / dentist which are really important to do

## 2020-08-23 NOTE — Assessment & Plan Note (Signed)
ERV 48% at wt 277   Body mass index is 41.04 kg/m.  -  trending down/ encouraged  No results found for: TSH   Contributing to gerd risk/ doe/reviewed the need and the process to achieve and maintain neg calorie balance > defer f/u primary care including intermittently monitoring thyroid status         Each maintenance medication was reviewed in detail including emphasizing most importantly the difference between maintenance and prns and under what circumstances the prns are to be triggered using an action plan format where appropriate.  Total time for H and P, chart review, counseling, reviewing flutter, hfa  device(s) and generating customized AVS unique to this office visit / same day charting = 38 min

## 2020-08-23 NOTE — Telephone Encounter (Signed)
Spoke with the pt and notified of response per Dr Wert He verbalized understanding  Nothing further needed 

## 2020-08-23 NOTE — Telephone Encounter (Signed)
Patient returned phone call to [facility] office.   He said he has 2 weeks already does this mean he will be taking 4 weeks of it because his RX is for 10 days only.   Sending back to Dr. [name] for clarification.

## 2020-09-13 ENCOUNTER — Telehealth: Payer: Self-pay | Admitting: Internal Medicine

## 2020-09-13 MED ORDER — PREDNISONE 10 MG PO TABS: ORAL_TABLET | ORAL | 0 refills | Status: DC

## 2020-09-13 MED ORDER — DOXYCYCLINE HYCLATE 100 MG PO TABS: 100.0000 mg | ORAL_TABLET | Freq: Two times a day (BID) | ORAL | 0 refills | Status: DC

## 2020-09-13 NOTE — Telephone Encounter (Signed)
Called and spoke with patient. He verbalized understanding. Will go ahead and send in the the doxy and prednisone to his pharmacy.   Nothing further needed at time of call.

## 2020-09-13 NOTE — Telephone Encounter (Signed)
Called and spoke with patient who states that he is trying to fight off a flare up and it is getting worse. Shortness of breath with exertion. Went to dentist and decided it he was not going to be able to lay there long enough for him to work on his teeth. Productive cough with white/grey sputum. Denies fever. Pt is asking for prednisone and doxycycline. [facility] pharmacy  Dr. [name] please advise

## 2020-09-13 NOTE — Telephone Encounter (Signed)
Prednisone 10 mg take  4 each am x 2 days,   2 each am x 2 days,  1 each am x 2 days and stop Doxy 100 mg bid x 7 d

## 2020-10-10 ENCOUNTER — Ambulatory Visit (INDEPENDENT_AMBULATORY_CARE_PROVIDER_SITE_OTHER): Payer: 59 | Admitting: Internal Medicine

## 2020-10-10 ENCOUNTER — Other Ambulatory Visit: Payer: Self-pay

## 2020-10-10 ENCOUNTER — Encounter: Payer: Self-pay | Admitting: Internal Medicine

## 2020-10-10 DIAGNOSIS — J449 Chronic obstructive pulmonary disease, unspecified: Secondary | ICD-10-CM | POA: Diagnosis not present

## 2020-10-10 MED ORDER — BREZTRI AEROSPHERE 160-9-4.8 MCG/ACT IN AERO: INHALATION_SPRAY | RESPIRATORY_TRACT | 5 refills | Status: DC

## 2020-10-10 NOTE — Progress Notes (Signed)
Thomas [name], male    DOB: 09-24-1960,     MRN: [id number]   Brief patient profile:  60 yowm quit smoking 1995 at wt  215  former pt of [place] with AB with no copd by pfts 10/2018 and working dx of AB.      History of Present Illness  07/31/2020  Pulmonary/ 1st office eval/ Thomas [name] / [place] Office  Chief Complaint  Patient presents with   Follow-up    Former pt of Thomas [name]. He states that his breathing has been progressively worse since the last visit in 2020. He states he has been on pred and doxy off and on and these help but only temporarily. He is currently on 40 mg pred daily. He has some prod cough with white to brown sputum and also wheezing. He is using his albuterol inhaler 2 x per wk and has duoneb he uses 3-4 x per day.   Dyspnea:  On good days can across the parking lot/  [employer] shopping / incline to [place] s stopping while on trelegy/ and daily neb albuterol worse than usual since March 2021 with flares every few weeks unless stays on prednisone  Cough: esp in am dark yellow assoc with intermittent nasal discharge / poor dentition  Sleep: bed is flat right side down SABA use: neb  No 02  Rec For cough/ congestion > mucineX 1200 mg every 12 hours Plan A = Automatic = Always=    Breztri Take 2 puffs first thing in am and then another 2 puffs about 12 hours later.  Work on inhaler technique:  Finish up your prednisone  Plan B = Backup (to supplement plan A, not to replace it) Only use your albuterol inhaler as a rescue medication  Plan C = Crisis (instead of Plan B but only if Plan B stops working) - only use your albuterol nebulizer if you first try Plan B and it fails to help > ok to use the nebulizer up to every 4 hours but if start needing it regularly call for immediate appointment GERD diet  Stop trelegy We will call to schedule a sinus CT   1. Moderate inferior right maxillary sinus mucosal thickening, which is indeterminate but potential odontogenic given  adjacent carious right maxillary molar. Ossific fragment in the posterior/inferior right maxillary sinus may represent a tooth fragment. 2. Otherwise, mild paranasal sinus mucosal thickening with air-fluid level in the left maxillary sinus. 3. Evidence of prior endoscopic sinus surgery with right maxillary antrostomy and right ethmoidectomies  >  ENT/dental eval Please schedule a follow up office visit in 2 weeks, sooner if needed  with all medications /inhalers/ solutions in hand so we can verify exactly what you are taking. This includes all medications from all doctors and over the counters   08/22/2020  f/u ov/[name] office/Thomas [name] re: worse sob/cough x 08/19/20 flare [name] eval pending for ongong sinusitis  Chief Complaint  Patient presents with   Follow-up    Cough with yellow sputum past 2-3 days. He is also more SOB and has been wheezing. He feels like leaning forward helps him to breathe better. He has had some swelling in his feet. He has used his rescue inhaler once since these symptoms started.     Dyspnea:  room to room Cough: congested yellow mucus x3 days  Sleeping: bed is flat with 1 one pillow rotated on side or prone  SABA use: none today  02: none  Covid status: x 2  Rec Plan A = Automatic = Always=    Breztri Take 2 puffs first thing in am and then another 2 puffs about 12 hours later.   Work on inhaler technique:   Plan B = Backup (to supplement plan A, not to replace it) Only use your albuterol inhaler as a rescue medication  Plan C = Crisis (instead of Plan B but only if Plan B stops working) - only use your albuterol nebulizer if you first try Plan B and it fails to help  For cough > mucinex dm 1200 mg every 12 hours and use flutter valve as much as possible  Late add  : augmentin x 14 more days and keep teoh/ dental appts    10/10/2020  f/u ov/[name] office/Thomas [name] re: AB maint on  breztri 2 bid   Chief Complaint  Patient presents with   Follow-up     Breathing has improved since the last visit. He c/o cough with white to grey sputum and has occ wheezing. He has not used his albuterol.    Dyspnea:  [employer] pushing buggy Cough: after supper  Sleeping: better  SABA use: much less 02: none  Covid status: x 3      No obvious day to day or daytime variability or assoc  sputum or mucus   or hemoptysis or cp or chest tightness,  r overt  hb symptoms.   Sleeping  without nocturnal  or early am exacerbation  of respiratory  c/o's or need for noct saba. Also denies any obvious fluctuation of symptoms with weather or environmental changes or other aggravating or alleviating factors except as outlined above   No unusual exposure hx or h/o childhood pna/ asthma or knowledge of premature birth.  Current Allergies, Complete Past Medical History, Past Surgical History, Family History, and Social History were reviewed in [employer] record.  ROS  The following are not active complaints unless bolded Hoarseness, sore throat, dysphagia, dental problems, itching, sneezing,  nasal congestion or discharge of excess mucus or purulent secretions, ear ache,   fever, chills, sweats, unintended wt loss or wt gain, classically pleuritic or exertional cp,  orthopnea pnd or arm/hand swelling  or leg swelling, presyncope, palpitations, abdominal pain, anorexia, nausea, vomiting, diarrhea  or change in bowel habits or change in bladder habits, change in stools or change in urine, dysuria, hematuria,  rash, arthralgias, visual complaints, headache, numbness, weakness or ataxia or problems with walking or coordination,  change in mood or  memory.        Current Meds  Medication Sig   acetaminophen (TYLENOL) 500 MG tablet Take 500 mg by mouth every 6 (six) hours as needed for moderate pain.   albuterol (PROVENTIL) (2.5 MG/3ML) 0.083% nebulizer solution Take 3 mLs (2.5 mg total) by nebulization every 4 (four) hours as needed for wheezing or shortness  of breath.   Albuterol Sulfate 108 (90 Base) MCG/ACT AEPB Inhale 2 puffs into the lungs every 6 (six) hours as needed (sob, wheeze).   allopurinol (ZYLOPRIM) 300 MG tablet Take 150 mg by mouth daily.   amoxicillin (AMOXIL) 500 MG capsule Take 1 capsule by mouth 3 (three) times daily.   fluticasone (FLONASE) 50 MCG/ACT nasal spray Place 2 sprays into both nostrils daily.   metoprolol tartrate (LOPRESSOR) 25 MG tablet Take 1 tablet (25 mg total) by mouth 2 (two) times daily.   montelukast (SINGULAIR) 10 MG tablet Take 10 mg by mouth at bedtime.   torsemide (DEMADEX) 20 MG  tablet Take 20 mg by mouth daily.                          Past Medical History:  Diagnosis Date   Arthritis    "touch in my neck" (09-21-12)   COPD (chronic obstructive pulmonary disease) (HCC)    GERD (gastroesophageal reflux disease)    Hypertension    Kidney stones    "passed on their own" (21-Sep-2012)   Lateral meniscus tear    Medial meniscus tear    Obesity, Class III, BMI 40-49.9 (morbid obesity) ([place]) 05/05/2019   OSA (obstructive sleep apnea)    "cannot sleep w/mask" (September 21, 2012)   Prostate cancer ([place])    Shortness of breath    "can happen at anytime" (09/21/2012)   Thyroid nodule    bilaterally/notes 21-Sep-2012        Objective:       10/10/2020        286  08/22/2020        286   07/31/20 293 lb (132.9 kg)  01/27/20 290 lb 9 oz (131.8 kg)  05/04/19 288 lb 9.3 oz (130.9 kg)    Vital signs reviewed  10/10/2020  - Note at rest 02 sats  96% on RA   General appearance:    obese slt hoarse amb wm nad     HEENT : pt wearing mask not removed for exam due to covid - 19 concerns.   NECK :  without JVD/Nodes/TM/ nl carotid upstrokes bilaterally   LUNGS: no acc muscle use,  Min barrel  contour chest wall with bilateral  mid exp rhonchi   and  without cough on insp or exp maneuvers and min  Hyperresonant  to  percussion bilaterally     CV:  RRR  no s3 or murmur or increase in P2, and no edema    ABD:  obese soft and nontender with pos end  insp Hoover's  in the supine position. No bruits or organomegaly appreciated, bowel sounds nl  MS:   Nl gait/  ext warm without deformities, calf tenderness, cyanosis or clubbing No obvious joint restrictions   SKIN: warm and dry without lesions    NEURO:  alert, approp, nl sensorium with  no motor or cerebellar deficits apparent.              Assessment

## 2020-10-10 NOTE — Patient Instructions (Addendum)
No change in medications  Please remember to go to the lab department   for your tests - we will call you with the results when they are available.    Please schedule a follow up visit in 6 months but call sooner if needed

## 2020-10-11 ENCOUNTER — Encounter: Payer: Self-pay | Admitting: Internal Medicine

## 2020-10-11 NOTE — Assessment & Plan Note (Signed)
ERV 48% at wt 277   Body mass index is 38.79 kg/m.  -  trending up  No results found for: TSH    Contributing to doe and risk of GERD >>>   reviewed the need and the process to achieve and maintain neg calorie balance > defer f/u primary care including intermittently monitoring thyroid status            Each maintenance medication was reviewed in detail including emphasizing most importantly the difference between maintenance and prns and under what circumstances the prns are to be triggered using an action plan format where appropriate.  Total time for H and P, chart review, counseling, reviewing hfa device(s) and generating customized AVS unique to this office visit / same day charting  25 min

## 2020-10-11 NOTE — Assessment & Plan Note (Signed)
Quit smoking 1995 at wt 215  - pfts 10/27/2018 nl including f/v loop x for EV 48% at wt = 277  - 07/31/2020  After extensive coaching inhaler device,  effectiveness =    80% so try change from trelegy to breztri 2bid  - flare x 08/14/20 rx augmentin x 21 day and pred x 6 days  - 10/10/2020  After extensive coaching inhaler device,  effectiveness =    90%  - Allergy profile 10/10/2020 >  Eos 0. /  IgE    Adequate control on present rx, reviewed in detail with pt > no change in rx needed at this point   Re saba: I spent extra time with pt today reviewing appropriate use of albuterol for prn use on exertion with the following points: 1) saba is for relief of sob that does not improve by walking a slower pace or resting but rather if the pt does not improve after trying this first. 2) If the pt is convinced, as many are, that saba helps recover from activity faster then it's easy to tell if this is the case by re-challenging : ie stop, take the inhaler, then p 5 minutes try the exact same activity (intensity of workload) that just caused the symptoms and see if they are substantially diminished or not after saba 3) if there is an activity that reproducibly causes the symptoms, try the saba 15 min before the activity on alternate days   If in fact the saba really does help, then fine to continue to use it prn but advised may need to look closer at the maintenance regimen being used to achieve better control of airways disease with exertion.    F/u q 6 m

## 2020-10-14 LAB — CBC WITH DIFFERENTIAL/PLATELET
Basophils Absolute: 0.1 10*3/uL (ref 0.0–0.2)
Basos: 1 %
EOS (ABSOLUTE): 1.5 10*3/uL — ABNORMAL HIGH (ref 0.0–0.4)
Eos: 16 %
Hematocrit: 44 % (ref 37.5–51.0)
Hemoglobin: 15.1 g/dL (ref 13.0–17.7)
Immature Grans (Abs): 0 10*3/uL (ref 0.0–0.1)
Immature Granulocytes: 0 %
Lymphocytes Absolute: 1.7 10*3/uL (ref 0.7–3.1)
Lymphs: 20 %
MCH: 29.3 pg (ref 26.6–33.0)
MCHC: 34.3 g/dL (ref 31.5–35.7)
MCV: 85 fL (ref 79–97)
Monocytes Absolute: 0.8 10*3/uL (ref 0.1–0.9)
Monocytes: 10 %
Neutrophils Absolute: 4.7 10*3/uL (ref 1.4–7.0)
Neutrophils: 53 %
Platelets: 310 10*3/uL (ref 150–450)
RBC: 5.15 x10E6/uL (ref 4.14–5.80)
RDW: 13.1 % (ref 11.6–15.4)
WBC: 8.9 10*3/uL (ref 3.4–10.8)

## 2020-10-14 LAB — IGE: IgE (Immunoglobulin E), Serum: 296 IU/mL (ref 6–495)

## 2020-10-17 ENCOUNTER — Telehealth: Payer: Self-pay | Admitting: Internal Medicine

## 2020-10-17 MED ORDER — PREDNISONE 10 MG PO TABS: ORAL_TABLET | ORAL | 0 refills | Status: AC

## 2020-10-17 MED ORDER — AZITHROMYCIN 250 MG PO TABS: ORAL_TABLET | ORAL | 0 refills | Status: DC

## 2020-10-17 NOTE — Telephone Encounter (Signed)
z-pak 

## 2020-10-17 NOTE — Telephone Encounter (Signed)
Called and spoke with pt letting him know the info and recs stated by Dr. [name]. Pt has been scheduled a f/u with Dr. [name] Oct. 11.  When stated to pt that we were sending Rx for prednisone to the pharmacy for pt, he asked if an abx could also be sent in for him as he said with all the congestion he is having, usually an abx helps to clear that up.  Asked pt if he was coughing up any phlegm and he said that he was. States that at times the phlegm gags him when he coughs. Pt said that at times the phlegm does have some discoloration to it. Dr. [name], please advise.

## 2020-10-17 NOTE — Telephone Encounter (Signed)
Prednisone 10 mg take  4 each am x 2 days,   2 each am x 2 days,  1 each am x 2 days and stop   Needs to move up appt to next available as clearly the maint rx is not working as he has flared up way earlier than I would have expected   Will need to bring all meds/inhalers/solutions with him to sort out what the new rx should look like

## 2020-10-17 NOTE — Telephone Encounter (Signed)
Called and spoke with pt letting him know that MW said we could send in zpak and pt verbalized understanding. Rx has been sent to pharmacy for pt. Nothing further needed.

## 2020-10-17 NOTE — Telephone Encounter (Signed)
Called and spoke with patient who states about 2 days ago he started having a flair up and would like meds called into [facility] [place]. Productive cough with white sputum and increased shortness of breath. Also states that he is having trouble sleeping. Denies any fever.  Dr. [name] please advise

## 2020-12-11 ENCOUNTER — Other Ambulatory Visit: Payer: Self-pay

## 2020-12-11 ENCOUNTER — Ambulatory Visit (INDEPENDENT_AMBULATORY_CARE_PROVIDER_SITE_OTHER): Payer: 59 | Admitting: Internal Medicine

## 2020-12-11 ENCOUNTER — Encounter: Payer: Self-pay | Admitting: Internal Medicine

## 2020-12-11 VITALS — BP 138/90 | HR 84 | Temp 98.0°F | Ht 70.0 in | Wt 291.0 lb

## 2020-12-11 DIAGNOSIS — Z23 Encounter for immunization: Secondary | ICD-10-CM

## 2020-12-11 DIAGNOSIS — J329 Chronic sinusitis, unspecified: Secondary | ICD-10-CM

## 2020-12-11 DIAGNOSIS — J449 Chronic obstructive pulmonary disease, unspecified: Secondary | ICD-10-CM

## 2020-12-11 MED ORDER — OMEPRAZOLE MAGNESIUM 20 MG PO TBEC: DELAYED_RELEASE_TABLET | ORAL | Status: DC

## 2020-12-11 MED ORDER — FAMOTIDINE 20 MG PO TABS: ORAL_TABLET | ORAL | Status: DC

## 2020-12-11 MED ORDER — PREDNISONE 10 MG PO TABS: ORAL_TABLET | ORAL | 0 refills | Status: DC

## 2020-12-11 NOTE — Patient Instructions (Addendum)
Prednisone 10 mg take  4 each am x 2 days,   2 each am x 2 days,  1 each am x 2 days and stop    I will be referring you to allergist = [name] to eval you further for pos allergy testing    Try adding every day >>> prilosec otc 20mg   Take 30-60 min before first meal of the day and Pepcid ac (famotidine) 20 mg one @  supper x one month to see if helps breathing/coughing/ wheezing    GERD (REFLUX)  is an extremely common cause of respiratory symptoms just like yours , many times with no obvious heartburn at all.    It can be treated with medication, but also with lifestyle changes including elevation of the head of your bed (ideally with 6 -8inch blocks under the headboard of your bed),  Smoking cessation, avoidance of late meals, excessive alcohol, and avoid fatty foods, chocolate, peppermint, colas, red wine, and acidic juices such as orange juice.  NO MINT OR MENTHOL PRODUCTS SO NO COUGH DROPS  USE SUGARLESS CANDY INSTEAD (Jolley ranchers or Stover's or Life Savers) or even ice chips will also do - the key is to swallow to prevent all throat clearing. NO OIL BASED VITAMINS - use powdered substitutes.  Avoid fish oil when coughing.     Please schedule a follow up visit in 6  months but call sooner if needed

## 2020-12-11 NOTE — Progress Notes (Signed)
Thomas [name], male    DOB: [birth year],     MRN: [id number]   Brief patient profile:  60 yowm quit smoking 1995 at wt  215  former pt of [facility] with AB with no copd by pfts 10/2018 and working dx of AB.      History of Present Illness  07/31/2020  Pulmonary/ 1st office eval/ Thomas [name] / [facility]  Chief Complaint  Patient presents with   Follow-up    Former pt of [name]. He states that his breathing has been progressively worse since the last visit in 2020. He states he has been on pred and doxy off and on and these help but only temporarily. He is currently on 40 mg pred daily. He has some prod cough with white to brown sputum and also wheezing. He is using his albuterol inhaler 2 x per wk and has duoneb he uses 3-4 x per day.   Dyspnea:  On good days can across the parking lot/  [employer] shopping / incline to MB s stopping while on trelegy/ and daily neb albuterol worse than usual since March 2021 with flares every few weeks unless stays on prednisone  Cough: esp in am dark yellow assoc with intermittent nasal discharge / poor dentition  Sleep: bed is flat right side down SABA use: neb  No 02  Rec For cough/ congestion > mucineX 1200 mg every 12 hours Plan A = Automatic = Always=    Breztri Take 2 puffs first thing in am and then another 2 puffs about 12 hours later.  Work on inhaler technique:  Finish up your prednisone  Plan B = Backup (to supplement plan A, not to replace it) Only use your albuterol inhaler as a rescue medication  Plan C = Crisis (instead of Plan B but only if Plan B stops working) - only use your albuterol nebulizer if you first try Plan B and it fails to help > ok to use the nebulizer up to every 4 hours but if start needing it regularly call for immediate appointment GERD diet  Stop trelegy We will call to schedule a sinus CT   1. Moderate inferior right maxillary sinus mucosal thickening, which is indeterminate but potential odontogenic given  adjacent carious right maxillary molar. Ossific fragment in the posterior/inferior right maxillary sinus may represent a tooth fragment. 2. Otherwise, mild paranasal sinus mucosal thickening with air-fluid level in the left maxillary sinus. 3. Evidence of prior endoscopic sinus surgery with right maxillary antrostomy and right ethmoidectomies  >  ENT/dental eval Please schedule a follow up office visit in 2 weeks, sooner if needed  with all medications /inhalers/ solutions in hand so we can verify exactly what you are taking. This includes all medications from all doctors and over the counters   08/22/2020  f/u ov/ office/Thomas [name] re: worse sob/cough x 08/19/20 flare [name] eval pending for ongong sinusitis  Chief Complaint  Patient presents with   Follow-up    Cough with yellow sputum past 2-3 days. He is also more SOB and has been wheezing. He feels like leaning forward helps him to breathe better. He has had some swelling in his feet. He has used his rescue inhaler once since these symptoms started.    Dyspnea:  room to room Cough: congested yellow mucus x3 days  Sleeping: bed is flat with 1 one pillow rotated on side or prone  SABA use: none today  02: none  Covid status: x  2  Rec Plan A = Automatic = Always=    Breztri Take 2 puffs first thing in am and then another 2 puffs about 12 hours later.   Work on inhaler technique:   Plan B = Backup (to supplement plan A, not to replace it) Only use your albuterol inhaler as a rescue medication  Plan C = Crisis (instead of Plan B but only if Plan B stops working) - only use your albuterol nebulizer if you first try Plan B and it fails to help  For cough > mucinex dm 1200 mg every 12 hours and use flutter valve as much as possible  Late add  : augmentin x 14 more days and keep teoh/ dental appts    09/20/20 [name] eval f/u rec flonase    10/10/2020  f/u ov/[name] [facility]/Wilhelm Ganaway re: AB/ chronic sinusitis maint on  breztri 2 bid   Chief  Complaint  Patient presents with   Follow-up    Breathing has improved since the last visit. He c/o cough with white to grey sputum and has occ wheezing. He has not used his albuterol.    Dyspnea:  [employer] pushing buggy Cough: after supper  Sleeping: better  SABA use: much less 02: none  Covid status: x 3  Rec  Allergy profile 10/10/2020 >  Eos 1.5  /  IgE  296   12/11/2020  f/u ov/[facility] office/[name] re: AB/MO maint on breztri 2bid   Chief Complaint  Patient presents with   Follow-up    Patient says he is still struggling to breathe occasionally but uses nebulizer and inhaler and says it helps. Coughing up white/gray mucus that he says seems thicker than it used to.   Dyspnea:  [employer] shopping / crosses parking lot ok s 02/ never pretreats with saba  Cough: variable / some worse in am but nothing purulent assoc with nasal congestion   Sleeping: better / bed is flat one big pillow  SABA use: using rarely  02: none  Covid status: x 3      No obvious day to day or daytime variability or assoc excess/ purulent sputum or mucus plugs or hemoptysis or cp or chest tightness, subjective wheeze or overt sinus or hb symptoms.   Sleeping  without nocturnal  or early am exacerbation  of respiratory  c/o's or need for noct saba. Also denies any obvious fluctuation of symptoms with weather or environmental changes or other aggravating or alleviating factors except as outlined above   No unusual exposure hx or h/o childhood pna/ asthma or knowledge of premature birth.  Current Allergies, Complete Past Medical History, Past Surgical History, Family History, and Social History were reviewed in [employer] record.  ROS  The following are not active complaints unless bolded Hoarseness, sore throat, dysphagia, dental problems, itching, sneezing,  nasal congestion or discharge of excess mucus or purulent secretions, ear ache,   fever, chills, sweats, unintended wt loss or  wt gain, classically pleuritic or exertional cp,  orthopnea pnd or arm/hand swelling  or leg swelling, presyncope, palpitations, abdominal pain, anorexia, nausea, vomiting, diarrhea  or change in bowel habits or change in bladder habits, change in stools or change in urine, dysuria, hematuria,  rash, arthralgias, visual complaints, headache, numbness, weakness or ataxia or problems with walking or coordination,  change in mood or  memory.        Current Meds  Medication Sig   albuterol (PROVENTIL) (2.5 MG/3ML) 0.083% nebulizer solution Take 3 mLs (  2.5 mg total) by nebulization every 4 (four) hours as needed for wheezing or shortness of breath.   Albuterol Sulfate 108 (90 Base) MCG/ACT AEPB Inhale 2 puffs into the lungs every 6 (six) hours as needed (sob, wheeze).   allopurinol (ZYLOPRIM) 300 MG tablet Take 150 mg by mouth daily.   Budeson-Glycopyrrol-Formoterol (BREZTRI AEROSPHERE) 160-9-4.8 MCG/ACT AERO Take 2 puffs first thing in am and then another 2 puffs about 12 hours later.   guaiFENesin (MUCINEX) 600 MG 12 hr tablet Take by mouth 2 (two) times daily.   metoprolol tartrate (LOPRESSOR) 25 MG tablet Take 1 tablet (25 mg total) by mouth 2 (two) times daily.   montelukast (SINGULAIR) 10 MG tablet Take 10 mg by mouth at bedtime.   torsemide (DEMADEX) 20 MG tablet Take 20 mg by mouth daily.                 Past Medical History:  Diagnosis Date   Arthritis    "touch in my neck" (08/31/12)   COPD (chronic obstructive pulmonary disease) (HCC)    GERD (gastroesophageal reflux disease)    Hypertension    Kidney stones    "passed on their own" (2012/08/31)   Lateral meniscus tear    Medial meniscus tear    Obesity, Class III, BMI 40-49.9 (morbid obesity) ([place]) 05/05/2019   OSA (obstructive sleep apnea)    "cannot sleep w/mask" (Aug 31, 2012)   Prostate cancer ([place])    Shortness of breath    "can happen at anytime" (2012-08-31)   Thyroid nodule    bilaterally/notes 31-Aug-2012         Objective:    12/11/2020      291    10/10/2020        286  08/22/2020        286   07/31/20 293 lb (132.9 kg)  01/27/20 290 lb 9 oz (131.8 kg)  05/04/19 288 lb 9.3 oz (130.9 kg)     Vital signs reviewed  12/11/2020  - Note at rest 02 sats  94% on RA   General appearance:    slt hoarse amb obese wm nad   HEENT : pt wearing mask not removed for exam due to covid - 19 concerns.   NECK :  without JVD/Nodes/TM/ nl carotid upstrokes bilaterally   LUNGS: no acc muscle use,  Min barrel  contour chest wall with bilateral   late exp rhonchi  and  without cough on insp or exp maneuvers and min  Hyperresonant  to  percussion bilaterally     CV:  RRR  no s3 or murmur or increase in P2, and no edema   ABD:  soft and nontender with pos end  insp Hoover's  in the supine position. No bruits or organomegaly appreciated, bowel sounds nl  MS:   Nl gait/  ext warm without deformities, calf tenderness, cyanosis or clubbing No obvious joint restrictions   SKIN: warm and dry without lesions    NEURO:  alert, approp, nl sensorium with  no motor or cerebellar deficits apparent.                  Assessment

## 2020-12-12 ENCOUNTER — Encounter: Payer: Self-pay | Admitting: Internal Medicine

## 2020-12-12 DIAGNOSIS — J329 Chronic sinusitis, unspecified: Secondary | ICD-10-CM | POA: Insufficient documentation

## 2020-12-12 NOTE — Assessment & Plan Note (Signed)
Quit smoking 1995 at wt 215  - pfts 10/27/2018 nl including f/v loop x for EV 48% at wt = 277  - 07/31/2020  After extensive coaching inhaler device,  effectiveness =    80% so try change from trelegy to breztri 2bid  - CT sinus 08/09/20 pos > [name] eval (see chronic sinusitis  - flare x 08/14/20 rx augmentin x 21 day and pred x 6 days  - 10/10/2020  After extensive coaching inhaler device,  effectiveness =    90%  - Allergy profile 10/10/2020 >  Eos 1.5  /  IgE  296> allergy referral 12/11/2020   Continues with poor control of airways ? mech   1) chronic sinusitis . See sep a/p   2) allergic component > allergy eval req  3) gerd risk due to obesity/ coughing > try gerd rx x one month           Each maintenance medication was reviewed in detail including emphasizing most importantly the difference between maintenance and prns and under what circumstances the prns are to be triggered using an action plan format where appropriate.  Total time for H and P, chart review, counseling, reviewing hfa device(s) and generating customized AVS unique to this office visit / same day charting  > 30 min

## 2020-12-12 NOTE — Assessment & Plan Note (Signed)
-   CT sinus 08/09/20 pos > [name] eval (see chronic sinusitis  - flare x 08/14/20 rx augmentin x 21 day and pred x 6 days  - 09/20/20 [name] eval f/u rec flonase   F/u [name]/ allergy eval pending also

## 2021-02-14 ENCOUNTER — Ambulatory Visit (HOSPITAL_COMMUNITY)
Admission: RE | Admit: 2021-02-14 | Discharge: 2021-02-14 | Disposition: A | Discharge status: Home / Self Care | Payer: 59 | Source: Ambulatory Visit | Attending: Internal Medicine | Admitting: Internal Medicine

## 2021-02-14 ENCOUNTER — Other Ambulatory Visit: Payer: Self-pay

## 2021-02-14 ENCOUNTER — Other Ambulatory Visit (HOSPITAL_COMMUNITY): Payer: Self-pay | Admitting: Internal Medicine

## 2021-02-14 DIAGNOSIS — S93401A Sprain of unspecified ligament of right ankle, initial encounter: Secondary | ICD-10-CM

## 2021-02-20 ENCOUNTER — Ambulatory Visit (INDEPENDENT_AMBULATORY_CARE_PROVIDER_SITE_OTHER): Payer: 59 | Admitting: Allergy & Immunology

## 2021-02-20 ENCOUNTER — Other Ambulatory Visit: Payer: Self-pay

## 2021-02-20 ENCOUNTER — Encounter: Payer: Self-pay | Admitting: Allergy & Immunology

## 2021-02-20 VITALS — HR 75 | Temp 98.1°F | Resp 12 | Ht 68.5 in | Wt 293.2 lb

## 2021-02-20 DIAGNOSIS — J3089 Other allergic rhinitis: Secondary | ICD-10-CM | POA: Diagnosis not present

## 2021-02-20 DIAGNOSIS — J302 Other seasonal allergic rhinitis: Secondary | ICD-10-CM | POA: Diagnosis not present

## 2021-02-20 DIAGNOSIS — J449 Chronic obstructive pulmonary disease, unspecified: Secondary | ICD-10-CM

## 2021-02-20 MED ORDER — CETIRIZINE HCL 10 MG PO TABS: 10.0000 mg | ORAL_TABLET | Freq: Every day | ORAL | 5 refills | Status: DC

## 2021-02-20 NOTE — Patient Instructions (Addendum)
1. Asthma-COPD overlap syndrome - Lung testing looks decent today and there was some reversibility with the albuterol treatment.  - Consider starting Berna Bue for better control of your breathing. 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  - I just talked to her and she said that she has gotten a number of biologics approved with the Friday insurance plan.  - Daily controller medication(s): Breztri two puffs twice with spacer and Singulair (montelukast) 10mg  daily - Prior to physical activity: albuterol 2 puffs 10-15 minutes before physical activity. - Rescue medications: albuterol 4 puffs every 4-6 hours as needed - Asthma control goals:  * Full participation in all desired activities (may need albuterol before activity) * Albuterol use two time or less a week on average (not counting use with activity) * Cough interfering with sleep two time or less a month * Oral steroids no more than once a year * No hospitalizations  2. Seasonal and perennial allergic rhinitis - Testing today showed: grasses, ragweed, weeds, trees, indoor molds, outdoor molds, dust mites, cat, dog, and cockroach. - Copy of test results provided.  - Avoidance measures provided. - Stop taking: - Continue with: Singulair (montelukast) 10mg  daily - Start taking: Zyrtec (cetirizine) 10mg  tablet once daily - You can use an extra dose of the antihistamine, if needed, for breakthrough symptoms.  - Consider nasal saline rinses 1-2 times daily to remove allergens from the nasal cavities as well as help with mucous clearance (this is especially helpful to do before the nasal sprays are given) - Consider allergy shots as a means of long-term control. - Allergy shots "re-train" and "reset" the immune system to ignore environmental allergens and decrease the resulting immune response to those allergens (sneezing, itchy watery eyes, runny nose, nasal congestion, etc).     - Allergy shots improve symptoms in 75-85% of patients.  - We can discuss more at the next appointment if the medications are not working for you.  3. Return in about 4 weeks (around 03/20/2021).    Please inform us of any Emergency Department visits, hospitalizations, or changes in symptoms. Call us before going to the ED for breathing or allergy symptoms since we might be able to fit you in for a sick visit. Feel free to contact us anytime with any questions, problems, or concerns.  It was a pleasure to meet you today!  Websites that have reliable patient information: 1. American Academy of Asthma, Allergy, and Immunology: www.aaaai.org 2. Food Allergy Research and Education (FARE): foodallergy.org 3. Mothers of Asthmatics: http://www.asthmacommunitynetwork.org 4. American College of Allergy, Asthma, and Immunology: www.acaai.org   COVID-19 Vaccine Information can be found at: [email or web address] For questions related to vaccine distribution or appointments, please email [email or web address] or call [phone].   We realize that you might be concerned about having an allergic reaction to the COVID19 vaccines. To help with that concern, WE ARE OFFERING THE COVID19 VACCINES IN OUR OFFICE! Ask the front desk for dates!     Like [place] on [employer] and Instagram for our latest updates!      A healthy democracy works best when [employer] participate! Make sure you are registered to vote! If you have moved or changed any of your contact information, you will need to get this updated before voting!  In some cases, you MAY be able to register to vote online: [email or web address]     Airborne Adult Perc - 02/20/21 1614  Time Antigen Placed 1545    Allergen Manufacturer Greer    Location Back    Number of Test 59    1. Control-Buffer 50% Glycerol Negative    2. Control-Histamine 1 mg/ml 2+     3. Albumin saline Negative    4. [place] 2+    5. [place] 2+    6. Johnson Negative    7. [place] Blue 3+    8. Meadow Fescue 3+    9. Perennial Rye 3+    10. Sweet Vernal Negative    11. Timothy Negative    12. Cocklebur Negative    13. Burweed Marshelder Negative    14. Ragweed, short Negative    15. Ragweed, Giant Negative    16. Plantain,  English Negative    17. Lamb's Quarters Negative    18. Sheep Sorrell Negative    19. Rough Pigweed Negative    20. Marsh Elder, Rough Negative    21. Mugwort, Common Negative    22. Ash mix Negative    23. Birch mix Negative    24. Beech American Negative    25. Box, Elder Negative    26. Cedar, red Negative    27. Cottonwood, [place] Negative    28. Elm mix Negative    29. Hickory Negative    30. Maple mix Negative    31. Oak, [place] mix Negative    32. Pecan Pollen Negative    33. Pine mix Negative    34. Sycamore Eastern Negative    35. [place], Black Pollen Negative    36. Alternaria alternata Negative    37. Cladosporium Herbarum Negative    38. Aspergillus mix Negative    39. Penicillium mix Negative    40. Bipolaris sorokiniana (Helminthosporium) Negative    41. Drechslera spicifera (Curvularia) Negative    42. Mucor plumbeus 2+    43. Fusarium moniliforme 2+    44. Aureobasidium pullulans (pullulara) Negative    45. Rhizopus oryzae Negative    46. Botrytis cinera Negative    47. Epicoccum nigrum Negative    48. Phoma betae Negative    49. Candida Albicans Negative    50. Trichophyton mentagrophytes Negative    51. Mite, D Farinae  5,000 AU/ml Negative    52. Mite, D Pteronyssinus  5,000 AU/ml Negative    53. Cat Hair 10,000 BAU/ml 2+    54.  Dog Epithelia 2+    55. Mixed Feathers Negative    56. Horse Epithelia Negative    57. Cockroach, German Negative    58. Mouse Negative    59. Tobacco Leaf Negative             Intradermal - 02/20/21 1603     Time Antigen Placed 1603    Allergen Manufacturer Lavella Hammock     Location Arm    Number of Test 9    Intradermal Select    Control Negative    Johnson 1+    Ragweed mix 1+    Weed mix 2+    Tree mix 2+    Mold 1 4+    Mold 2 4+    Cockroach 3+    Mite mix 4+             Reducing Pollen Exposure  The American Academy of Allergy, Asthma and Immunology suggests the following steps to reduce your exposure to pollen during allergy seasons.    Do not hang sheets or clothing out to dry; pollen may collect  on these items. Do not mow lawns or spend time around freshly cut grass; mowing stirs up pollen. Keep windows closed at night.  Keep car windows closed while driving. Minimize morning activities outdoors, a time when pollen counts are usually at their highest. Stay indoors as much as possible when pollen counts or humidity is high and on windy days when pollen tends to remain in the air longer. Use air conditioning when possible.  Many air conditioners have filters that trap the pollen spores. Use a HEPA room air filter to remove pollen form the indoor air you breathe.  Control of Mold Allergen   Mold and fungi can grow on a variety of surfaces provided certain temperature and moisture conditions exist.  Outdoor molds grow on plants, decaying vegetation and soil.  The major outdoor mold, Alternaria and Cladosporium, are found in very high numbers during hot and dry conditions.  Generally, a late Summer - Fall peak is seen for common outdoor fungal spores.  Rain will temporarily lower outdoor mold spore count, but counts rise rapidly when the rainy period ends.  The most important indoor molds are Aspergillus and Penicillium.  Dark, humid and poorly ventilated basements are ideal sites for mold growth.  The next most common sites of mold growth are the bathroom and the kitchen.  Outdoor (Seasonal) Mold Control  Positive outdoor molds via skin testing: Alternaria, Cladosporium, and Mucor  Use air conditioning and keep windows closed Avoid  exposure to decaying vegetation. Avoid leaf raking. Avoid grain handling. Consider wearing a face mask if working in moldy areas.    Indoor (Perennial) Mold Control   Positive indoor molds via skin testing: Aspergillus, Penicillium, and Fusarium  Maintain humidity below 50%. Clean washable surfaces with 5% bleach solution. Remove sources e.g. contaminated carpets.    Control of Dust Mite Allergen    Dust mites play a major role in allergic asthma and rhinitis.  They occur in environments with high humidity wherever human skin is found.  Dust mites absorb humidity from the atmosphere (ie, they do not drink) and feed on organic matter (including shed human and animal skin).  Dust mites are a microscopic type of insect that you cannot see with the naked eye.  High levels of dust mites have been detected from mattresses, pillows, carpets, upholstered furniture, bed covers, clothes, soft toys and any woven material.  The principal allergen of the dust mite is found in its feces.  A gram of dust may contain 1,000 mites and 250,000 fecal particles.  Mite antigen is easily measured in the air during house cleaning activities.  Dust mites do not bite and do not cause harm to humans, other than by triggering allergies/asthma.    Ways to decrease your exposure to dust mites in your home:  Encase mattresses, box springs and pillows with a mite-impermeable barrier or cover   Wash sheets, blankets and drapes weekly in hot water (130 F) with detergent and dry them in a dryer on the hot setting.  Have the room cleaned frequently with a vacuum cleaner and a damp dust-mop.  For carpeting or rugs, vacuuming with a vacuum cleaner equipped with a high-efficiency particulate air (HEPA) filter.  The dust mite allergic individual should not be in a room which is being cleaned and should wait 1 hour after cleaning before going into the room. Do not sleep on upholstered furniture (eg, couches).   If possible  removing carpeting, upholstered furniture and drapery from the home is ideal.  Horizontal blinds should be eliminated in the rooms where the person spends the most time (bedroom, study, television room).  Washable vinyl, roller-type shades are optimal. Remove all non-washable stuffed toys from the bedroom.  Wash stuffed toys weekly like sheets and blankets above.   Reduce indoor humidity to less than 50%.  Inexpensive humidity monitors can be purchased at most hardware stores.  Do not use a humidifier as can make the problem worse and are not recommended.  Control of Dog or Cat Allergen  Avoidance is the best way to manage a dog or cat allergy. If you have a dog or cat and are allergic to dog or cats, consider removing the dog or cat from the home. If you have a dog or cat but dont want to find it a new home, or if your family wants a pet even though someone in the household is allergic, here are some strategies that may help keep symptoms at bay:  Keep the pet out of your bedroom and restrict it to only a few rooms. Be advised that keeping the dog or cat in only one room will not limit the allergens to that room. Dont pet, hug or kiss the dog or cat; if you do, wash your hands with soap and water. High-efficiency particulate air (HEPA) cleaners run continuously in a bedroom or living room can reduce allergen levels over time. Regular use of a high-efficiency vacuum cleaner or a central vacuum can reduce allergen levels. Giving your dog or cat a bath at least once a week can reduce airborne allergen.  Control of Cockroach Allergen  Cockroach allergen has been identified as an important cause of acute attacks of asthma, especially in urban settings.  There are fifty-five species of cockroach that exist in the [place], however only three, the [place], [place] species produce allergen that can affect patients with Asthma.  Allergens can be obtained from fecal particles, egg casings  and secretions from cockroaches.    Remove food sources. Reduce access to water. Seal access and entry points. Spray runways with 0.5-1% Diazinon or Chlorpyrifos Blow boric acid power under stoves and refrigerator. Place bait stations (hydramethylnon) at feeding sites.  Allergy Shots   Allergies are the result of a chain reaction that starts in the immune system. Your immune system controls how your body defends itself. For instance, if you have an allergy to pollen, your immune system identifies pollen as an invader or allergen. Your immune system overreacts by producing antibodies called Immunoglobulin E (IgE). These antibodies travel to cells that release chemicals, causing an allergic reaction.  The concept behind allergy immunotherapy, whether it is received in the form of shots or tablets, is that the immune system can be desensitized to specific allergens that trigger allergy symptoms. Although it requires time and patience, the payback can be long-term relief.  How Do Allergy Shots Work?  Allergy shots work much like a vaccine. Your body responds to injected amounts of a particular allergen given in increasing doses, eventually developing a resistance and tolerance to it. Allergy shots can lead to decreased, minimal or no allergy symptoms.  There generally are two phases: build-up and maintenance. Build-up often ranges from three to six months and involves receiving injections with increasing amounts of the allergens. The shots are typically given once or twice a week, though more rapid build-up schedules are sometimes used.  The maintenance phase begins when the most effective dose is reached. This dose is different for each  person, depending on how allergic you are and your response to the build-up injections. Once the maintenance dose is reached, there are longer periods between injections, typically two to four weeks.  Occasionally doctors give cortisone-type shots that can  temporarily reduce allergy symptoms. These types of shots are different and should not be confused with allergy immunotherapy shots.  Who Can Be Treated with Allergy Shots?  Allergy shots may be a good treatment approach for people with allergic rhinitis (hay fever), allergic asthma, conjunctivitis (eye allergy) or stinging insect allergy.   Before deciding to begin allergy shots, you should consider:   The length of allergy season and the severity of your symptoms  Whether medications and/or changes to your environment can control your symptoms  Your desire to avoid long-term medication use  Time: allergy immunotherapy requires a major time commitment  Cost: may vary depending on your insurance coverage  Allergy shots for children age 22 and older are effective and often well tolerated. They might prevent the onset of new allergen sensitivities or the progression to asthma.  Allergy shots are not started on patients who are pregnant but can be continued on patients who become pregnant while receiving them. In some patients with other medical conditions or who take certain common medications, allergy shots may be of risk. It is important to mention other medications you talk to your allergist.   When Will I Feel Better?  Some may experience decreased allergy symptoms during the build-up phase. For others, it may take as long as 12 months on the maintenance dose. If there is no improvement after a year of maintenance, your allergist will discuss other treatment options with you.  If you arent responding to allergy shots, it may be because there is not enough dose of the allergen in your vaccine or there are missing allergens that were not identified during your allergy testing. Other reasons could be that there are high levels of the allergen in your environment or major exposure to non-allergic triggers like tobacco smoke.  What Is the Length of Treatment?  Once the maintenance dose is  reached, allergy shots are generally continued for three to five years. The decision to stop should be discussed with your allergist at that time. Some people may experience a permanent reduction of allergy symptoms. Others may relapse and a longer course of allergy shots can be considered.  What Are the Possible Reactions?  The two types of adverse reactions that can occur with allergy shots are local and systemic. Common local reactions include very mild redness and swelling at the injection site, which can happen immediately or several hours after. A systemic reaction, which is less common, affects the entire body or a particular body system. They are usually mild and typically respond quickly to medications. Signs include increased allergy symptoms such as sneezing, a stuffy nose or hives.  Rarely, a serious systemic reaction called anaphylaxis can develop. Symptoms include swelling in the throat, wheezing, a feeling of tightness in the chest, nausea or dizziness. Most serious systemic reactions develop within 30 minutes of allergy shots. This is why it is strongly recommended you wait in your doctors office for 30 minutes after your injections. Your allergist is trained to watch for reactions, and his or her staff is trained and equipped with the proper medications to identify and treat them.  Who Should Administer Allergy Shots?  The preferred location for receiving shots is your prescribing allergists office. Injections can sometimes be given at another  facility where the physician and staff are trained to recognize and treat reactions, and have received instructions by your prescribing allergist.

## 2021-02-20 NOTE — Progress Notes (Signed)
NEW PATIENT  Date of Service/Encounter:  02/20/21  Consult requested by: Asencion Noble, MD   Assessment:   Asthma-COPD overlap syndrome - with a component of reversibility today  History of smoking  Seasonal and perennial allergic rhinitis (grasses, ragweed, weeds, trees, indoor molds, outdoor molds, dust mites, cat, dog, and cockroach)  Elevated absolute eosinophil count - likely secondary to uncontrolled atopic disease, but could consider hypereosinophilic work-up in the future if indicated  Plan/Recommendations:   1. Asthma-COPD overlap syndrome - Lung testing looks decent today and there was some reversibility with the albuterol treatment.  - Consider starting Berna Bue for better control of your breathing. - Tammy will be reaching out to discuss this more (she is our Systems analyst and works in [place]).  - I just talked to her and she said that she has gotten a number of biologics approved with the Friday insurance plan.  - Daily controller medication(s): Breztri two puffs twice with spacer and Singulair (montelukast) 10mg  daily - Prior to physical activity: albuterol 2 puffs 10-15 minutes before physical activity. - Rescue medications: albuterol 4 puffs every 4-6 hours as needed - Asthma control goals:  * Full participation in all desired activities (may need albuterol before activity) * Albuterol use two time or less a week on average (not counting use with activity) * Cough interfering with sleep two time or less a month * Oral steroids no more than once a year * No hospitalizations  2. Seasonal and perennial allergic rhinitis - Testing today showed: grasses, ragweed, weeds, trees, indoor molds, outdoor molds, dust mites, cat, dog, and cockroach. - Copy of test results provided.  - Avoidance measures provided. - Stop taking: - Continue with: Singulair (montelukast) 10mg  daily - Start taking: Zyrtec (cetirizine) 10mg  tablet once daily - You can use an extra  dose of the antihistamine, if needed, for breakthrough symptoms.  - Consider nasal saline rinses 1-2 times daily to remove allergens from the nasal cavities as well as help with mucous clearance (this is especially helpful to do before the nasal sprays are given) - Consider allergy shots as a means of long-term control. - Allergy shots "re-train" and "reset" the immune system to ignore environmental allergens and decrease the resulting immune response to those allergens (sneezing, itchy watery eyes, runny nose, nasal congestion, etc).    - Allergy shots improve symptoms in 75-85% of patients.  - We can discuss more at the next appointment if the medications are not working for you.  3. Return in about 4 weeks (around 03/20/2021).   This note in its entirety was forwarded to the Provider who requested this consultation.  Subjective:   Thomas [name] is a 60 y.o. male presenting today for evaluation of  Chief Complaint  Patient presents with   Allergy Testing   Asthma    Patient has been hospitalized 3 times in the last year and they told him he had COPD. Patient went to Dr [name] and he was told there that he had asthmatic bronchitis. Patient was told he needed to come here as the next step.    Trayveon A Konczal has a history of the following: Patient Active Problem List   Diagnosis Date Noted   Chronic sinusitis 12/12/2020   Acute on chronic diastolic HF (heart failure) ([place])    Acute hypoxemic respiratory failure ([place]) 01/24/2020   Obesity, Class III, BMI 40-49.9 (morbid obesity) ([place]) 05/05/2019   COPD exacerbation ([place]) 05/04/2019   Hypoxia    Leg edema  COPD with acute exacerbation ([place]) 09/16/2018   CKD (chronic kidney disease), stage III ([place]) 09/16/2018   Acute respiratory failure with hypoxia ([facility]) 09/16/2018   Allergic rhinitis 08/26/2018   ARF (acute renal failure) ([place]) 10/17/2015   Hypertension    Asthmatic bronchitis , chronic (HCC)    GERD (gastroesophageal reflux  disease)    Arthritis    Kidney stones    Prostate cancer ([place])    Thyroid nodule    OSA (obstructive sleep apnea)    Medial meniscus tear    Lateral meniscus tear    Sleep apnea 11/29/2009   HYPERSOMNIA 09/26/2009   DYSPNEA 09/26/2009   PROSTATE CANCER, HX OF 09/26/2009   Essential hypertension 09/25/2009    History obtained from: chart review and patient.  Alano A Beckstrom was referred by Asencion Noble, MD.     Fedrick is a 60 y.o. male presenting for an evaluation of environmental allergies .   Asthma/Respiratory Symptom History: He  is on Breztri two puffs BID. Dr. [name] started the [facility]. He was previously on Trelegy but changed to [place] after 3 months. He did not noice that it was helping at all. He uses the albuterol infrequently. He used it last 2-3 days ago. He is a previous smoker and stopped in 1995 or 1996 and he smoked since he was a teenager. He was working at [employer]. Then he went to [employer]. [employer]. He stopped working there around 3-4 years ago. He stopped working there because "the company lied to [him]".,  He did have an elevated absolute eosinophil count of 1500 in August 2022.  Allergic Rhinitis Symptom History: He does have sneezing occasionally. He nasal nasal congestion infrequently.  He was born in [place]. He did have a history of recurrent sinusitis. HE has had sinus surgery, performed years ago (November 2010 or so).   Sinus CT (June 2022):   1. Moderate inferior right maxillary sinus mucosal thickening, which is indeterminate but potential odontogenic given adjacent carious right maxillary molar. Ossific fragment in the posterior/inferior right maxillary sinus may represent a tooth fragment.  2. Otherwise, mild paranasal sinus mucosal thickening with air-fluid level in the left maxillary sinus.  3. Evidence of prior endoscopic sinus surgery with right maxillary antrostomy and right  ethmoidectomies.  Skin Symptom History: He has a history of psoriasis that he treats with OTC cream. This is on his left elbow.   GERD Symptom History: He was on Prilosec and Pepcid and he did this for one month. He did not feel that there was any change. He stopped taking it.  He does not have the typical sensation of heart burn.   Otherwise, there is no history of other atopic diseases, including food allergies, drug allergies, stinging insect allergies, or contact dermatitis. There is no significant infectious history. Vaccinations are up to date.    Past Medical History: Patient Active Problem List   Diagnosis Date Noted   Chronic sinusitis 12/12/2020   Acute on chronic diastolic HF (heart failure) ([place])    Acute hypoxemic respiratory failure ([place]) 01/24/2020   Obesity, Class III, BMI 40-49.9 (morbid obesity) ([place]) 05/05/2019   COPD exacerbation ([place]) 05/04/2019   Hypoxia    Leg edema    COPD with acute exacerbation ([place]) 09/16/2018   CKD (chronic kidney disease), stage III ([place]) 09/16/2018   Acute respiratory failure with hypoxia (HCC) 09/16/2018   Allergic rhinitis 08/26/2018   ARF (acute renal failure) ([place]) 10/17/2015  Hypertension    Asthmatic bronchitis , chronic (HCC)    GERD (gastroesophageal reflux disease)    Arthritis    Kidney stones    Prostate cancer ([place])    Thyroid nodule    OSA (obstructive sleep apnea)    Medial meniscus tear    Lateral meniscus tear    Sleep apnea 11/29/2009   HYPERSOMNIA 09/26/2009   DYSPNEA 09/26/2009   PROSTATE CANCER, HX OF 09/26/2009   Essential hypertension 09/25/2009    Medication List:  Allergies as of 02/20/2021   No Known Allergies      Medication List        Accurate as of February 20, 2021 11:59 PM. If you have any questions, ask your nurse or doctor.          STOP taking these medications    famotidine 20 MG tablet Commonly known as: Pepcid Stopped by: Valentina Shaggy, MD       TAKE these  medications    Albuterol Sulfate 108 (90 Base) MCG/ACT Aepb Commonly known as: PROAIR RESPICLICK Inhale 2 puffs into the lungs every 6 (six) hours as needed (sob, wheeze).   albuterol (2.5 MG/3ML) 0.083% nebulizer solution Commonly known as: PROVENTIL Take 3 mLs (2.5 mg total) by nebulization every 4 (four) hours as needed for wheezing or shortness of breath.   allopurinol 300 MG tablet Commonly known as: ZYLOPRIM Take 150 mg by mouth daily.   Breztri Aerosphere 160-9-4.8 MCG/ACT Aero Generic drug: Budeson-Glycopyrrol-Formoterol Take 2 puffs first thing in am and then another 2 puffs about 12 hours later.   cetirizine 10 MG tablet Commonly known as: ZYRTEC Take 1 tablet (10 mg total) by mouth daily. Started by: Valentina Shaggy, MD   guaiFENesin 600 MG 12 hr tablet Commonly known as: MUCINEX Take by mouth 2 (two) times daily.   metoprolol tartrate 25 MG tablet Commonly known as: LOPRESSOR Take 1 tablet (25 mg total) by mouth 2 (two) times daily.   montelukast 10 MG tablet Commonly known as: SINGULAIR Take 10 mg by mouth at bedtime.   omeprazole 20 MG tablet Commonly known as: PriLOSEC OTC Take 30-60 min before first meal of the day   predniSONE 10 MG tablet Commonly known as: DELTASONE Take  4 each am x 2 days,   2 each am x 2 days,  1 each am x 2 days and stop   torsemide 20 MG tablet Commonly known as: DEMADEX Take 20 mg by mouth daily.        Birth History: non-contributory  Developmental History: non-contributory  Past Surgical History: Past Surgical History:  Procedure Laterality Date   BACK SURGERY  1990s   KNEE ARTHROSCOPY WITH MEDIAL MENISECTOMY Right 07/13/2013   Procedure: RIGHT KNEE ARTHROSCOPY WITH MEDIAL AND LATERAL MENISCECTOMY;  Surgeon: Lorn Junes, MD;  Location: [facility];  Service: Orthopedics;  Laterality: Right;   LUMBAR DISC SURGERY  1990's   "had 2 ruptured discs" (08/25/2012)   NASAL SINUS SURGERY  01/2009    ROBOT ASSISTED LAPAROSCOPIC RADICAL PROSTATECTOMY  2006   THYROIDECTOMY Left 08/25/2012   Procedure: LEFT PARTIAL THYROIDECTOMY;  Surgeon: Ascencion Dike, MD;  Location: [facility] OR;  Service: ENT;  Laterality: Left;   THYROIDECTOMY, PARTIAL Left 08/25/2012     Family History: Family History  Problem Relation Age of Onset   Aneurysm Mother    Heart attack Father      Social History: Linken lives at home with his wife. His wife has  dementia. She is 39 years old and he is her primary caregiver. Thankfully, she seems to be a happy dementia patient. They live in a house that is 60 years old.  He did have an add-on that is 60 years old.  There is carpeting throughout the home.  They have gas heating and central cooling with window units.  There are no animals inside or outside of the home.  There are no dust mite covers on the bedding.  There is no tobacco exposure.  He is currently a caregiver for his wife who has early onset dementia.  They have been married since 68.  He is exposed to fumes, chemicals, and dust.  They do not use a HEPA filter.  They do not live near an interstate or industrial area.   Review of Systems  Constitutional: Negative.  Negative for chills, fever, malaise/fatigue and weight loss.  HENT:  Positive for congestion and sinus pain. Negative for ear discharge and ear pain.   Eyes:  Negative for pain, discharge and redness.  Respiratory:  Positive for cough and shortness of breath. Negative for sputum production and wheezing.   Cardiovascular: Negative.  Negative for chest pain and palpitations.  Gastrointestinal:  Negative for abdominal pain, constipation, diarrhea, heartburn, nausea and vomiting.  Skin: Negative.  Negative for itching and rash.  Neurological:  Negative for dizziness and headaches.  Endo/Heme/Allergies:  Negative for environmental allergies. Does not bruise/bleed easily.      Objective:   Pulse 75, temperature 98.1 F (36.7 C), temperature source Temporal,  resp. rate 12, height 5' 8.5" (1.74 m), weight 293 lb 3.2 oz (133 kg), SpO2 95 %. Body mass index is 43.93 kg/m.   Physical Exam:   Physical Exam Vitals reviewed.  Constitutional:      Appearance: He is well-developed.  HENT:     Head: Normocephalic and atraumatic.     Right Ear: Tympanic membrane, ear canal and external ear normal. No drainage, swelling or tenderness. Tympanic membrane is not injected, scarred, erythematous, retracted or bulging.     Left Ear: Tympanic membrane, ear canal and external ear normal. No drainage, swelling or tenderness. Tympanic membrane is not injected, scarred, erythematous, retracted or bulging.     Nose: No nasal deformity, septal deviation, mucosal edema or rhinorrhea.     Right Turbinates: Enlarged, swollen and pale.     Left Turbinates: Enlarged, swollen and pale.     Right Sinus: No maxillary sinus tenderness or frontal sinus tenderness.     Left Sinus: No maxillary sinus tenderness or frontal sinus tenderness.     Comments: No nasal polyps noted.     Mouth/Throat:     Lips: Pink.     Mouth: Mucous membranes are moist. Mucous membranes are not pale and not dry.     Pharynx: Uvula midline.  Eyes:     General: Lids are normal. Allergic shiner present.        Right eye: No discharge.        Left eye: No discharge.     Conjunctiva/sclera: Conjunctivae normal.     Right eye: Right conjunctiva is not injected. No chemosis.    Left eye: Left conjunctiva is not injected. No chemosis.    Pupils: Pupils are equal, round, and reactive to light.  Cardiovascular:     Rate and Rhythm: Normal rate and regular rhythm.     Heart sounds: Normal heart sounds.  Pulmonary:     Effort: Pulmonary effort is normal. No tachypnea,  accessory muscle usage or respiratory distress.     Breath sounds: Normal breath sounds. No wheezing, rhonchi or rales.     Comments: Moving air well in all lung fields.  Chest:     Chest wall: No tenderness.  Abdominal:      Tenderness: There is no abdominal tenderness. There is no guarding or rebound.  Lymphadenopathy:     Head:     Right side of head: No submandibular, tonsillar or occipital adenopathy.     Left side of head: No submandibular, tonsillar or occipital adenopathy.     Cervical: No cervical adenopathy.  Skin:    General: Skin is warm.     Capillary Refill: Capillary refill takes less than 2 seconds.     Coloration: Skin is not pale.     Findings: No abrasion, erythema, petechiae or rash. Rash is not papular, urticarial or vesicular.  Neurological:     Mental Status: He is alert.  Psychiatric:        Behavior: Behavior is cooperative.     Diagnostic studies:    Spirometry: results abnormal (FEV1: 1.79/52%, FVC: 3.10/70%, FEV1/FVC: 58%).    Spirometry consistent with mixed obstructive and restrictive disease. Xopenex four puffs via MDI treatment given in clinic with improvement in FEV1 and FVC, but not significant per ATS criteria.  Allergy Studies:    Airborne Adult Perc - 02/20/21 1614     Time Antigen Placed 1545    Allergen Manufacturer Lavella Hammock    Location Back    Number of Test 59    1. Control-Buffer 50% Glycerol Negative    2. Control-Histamine 1 mg/ml 2+    3. Albumin saline Negative    4. [place] 2+    5. [place] 2+    6. Johnson Negative    7. Kentucky Blue 3+    8. Meadow Fescue 3+    9. Perennial Rye 3+    10. Sweet Vernal Negative    11. Timothy Negative    12. Cocklebur Negative    13. Burweed Marshelder Negative    14. Ragweed, short Negative    15. Ragweed, Giant Negative    16. Plantain,  English Negative    17. Lamb's Quarters Negative    18. Sheep Sorrell Negative    19. Rough Pigweed Negative    20. Marsh Elder, Rough Negative    21. Mugwort, Common Negative    22. Ash mix Negative    23. Birch mix Negative    24. Beech American Negative    25. Box, Elder Negative    26. Cedar, red Negative    27. Cottonwood, [place] Negative    28. Elm mix Negative     29. Hickory Negative    30. Maple mix Negative    31. Oak, [place] mix Negative    32. Pecan Pollen Negative    33. Pine mix Negative    34. Sycamore Eastern Negative    35. [place], Black Pollen Negative    36. Alternaria alternata Negative    37. Cladosporium Herbarum Negative    38. Aspergillus mix Negative    39. Penicillium mix Negative    40. Bipolaris sorokiniana (Helminthosporium) Negative    41. Drechslera spicifera (Curvularia) Negative    42. Mucor plumbeus 2+    43. Fusarium moniliforme 2+    44. Aureobasidium pullulans (pullulara) Negative    45. Rhizopus oryzae Negative    46. Botrytis cinera Negative    47. Epicoccum nigrum Negative  48. Phoma betae Negative    49. Candida Albicans Negative    50. Trichophyton mentagrophytes Negative    51. Mite, D Farinae  5,000 AU/ml Negative    52. Mite, D Pteronyssinus  5,000 AU/ml Negative    53. Cat Hair 10,000 BAU/ml 2+    54.  Dog Epithelia 2+    55. Mixed Feathers Negative    56. Horse Epithelia Negative    57. Cockroach, German Negative    58. Mouse Negative    59. Tobacco Leaf Negative             Intradermal - 02/20/21 1603     Time Antigen Placed 1603    Allergen Manufacturer Lavella Hammock    Location Arm    Number of Test 9    Intradermal Select    Control Negative    Johnson 1+    Ragweed mix 1+    Weed mix 2+    Tree mix 2+    Mold 1 4+    Mold 2 4+    Cockroach 3+    Mite mix 4+                Salvatore Marvel, MD [facility]

## 2021-02-21 ENCOUNTER — Encounter: Payer: Self-pay | Admitting: Allergy & Immunology

## 2021-02-28 ENCOUNTER — Telehealth: Payer: Self-pay | Admitting: Allergy & Immunology

## 2021-02-28 NOTE — Telephone Encounter (Signed)
Patient has stopped taking his cetirizine, patient states he was drowsy and not feeling well while taking the cetirizine. Patient states he is feeling much better now that he has stopped taking it.   I did ask patient if he would like Dr. [name] to send something else in and he stated that would be up to Dr. [name].   [facility] - [address] [place] [address]  Best contact number: [phone]

## 2021-03-05 MED ORDER — FEXOFENADINE HCL 180 MG PO TABS: 180.0000 mg | ORAL_TABLET | Freq: Every day | ORAL | 5 refills | Status: DC

## 2021-03-05 NOTE — Telephone Encounter (Signed)
Let's do Allegra 180mg  one tablet once daily. This causes the least amount of sleepiness of the antihistamines.   Salvatore Marvel, MD [facility]

## 2021-03-05 NOTE — Telephone Encounter (Signed)
Prescription has been sent to the requested pharmacy. Called and left a message informing patient to call the office. We will need to inform patient of this medication change.

## 2021-04-03 ENCOUNTER — Ambulatory Visit (INDEPENDENT_AMBULATORY_CARE_PROVIDER_SITE_OTHER): Payer: 59 | Admitting: Family

## 2021-04-03 ENCOUNTER — Encounter: Payer: Self-pay | Admitting: Allergy & Immunology

## 2021-04-03 ENCOUNTER — Other Ambulatory Visit: Payer: Self-pay

## 2021-04-03 VITALS — BP 130/88 | HR 81 | Temp 99.6°F | Resp 16 | Ht 68.0 in | Wt 297.0 lb

## 2021-04-03 DIAGNOSIS — J3089 Other allergic rhinitis: Secondary | ICD-10-CM | POA: Diagnosis not present

## 2021-04-03 DIAGNOSIS — J449 Chronic obstructive pulmonary disease, unspecified: Secondary | ICD-10-CM

## 2021-04-03 DIAGNOSIS — J302 Other seasonal allergic rhinitis: Secondary | ICD-10-CM

## 2021-04-03 DIAGNOSIS — Z91018 Allergy to other foods: Secondary | ICD-10-CM

## 2021-04-03 DIAGNOSIS — R21 Rash and other nonspecific skin eruption: Secondary | ICD-10-CM

## 2021-04-03 MED ORDER — SPACER/AERO-HOLDING CHAMBERS DEVI: 1.0000 | 1 refills | Status: AC

## 2021-04-03 MED ORDER — EPINEPHRINE 0.3 MG/0.3ML IJ SOAJ: 0.3000 mg | Freq: Once | INTRAMUSCULAR | 1 refills | Status: AC

## 2021-04-03 NOTE — Patient Instructions (Addendum)
1. Asthma-COPD overlap syndrome -Consider to think about Fasenra injections.  I will send a message to Tammy, our Biologics coordinator, since you have not heard from her - Daily controller medication(s): Breztri two puffs twice with spacer and Singulair (montelukast) 10mg  daily. We will send in a prescription for a spacer since you do not have one. - Prior to physical activity: albuterol 2 puffs 10-15 minutes before physical activity. - Rescue medications: albuterol 4 puffs every 4-6 hours as needed - Asthma control goals:  * Full participation in all desired activities (may need albuterol before activity) * Albuterol use two time or less a week on average (not counting use with activity) * Cough interfering with sleep two time or less a month * Oral steroids no more than once a year * No hospitalizations  2. Seasonal and perennial allergic rhinitis (grasses, ragweed, weeds, trees, indoor molds, outdoor molds, dust mites, cat, dog, and cockroach) - Stop taking: All antihistamines since you have had side effects with both Zyrtec and Allegra and you are currently not having any symptoms - Continue with: Singulair (montelukast) 10mg  daily - Consider allergy shots as a means of long-term control. - Allergy shots "re-train" and "reset" the immune system to ignore environmental allergens and decrease the resulting immune response to those allergens (sneezing, itchy watery eyes, runny nose, nasal congestion, etc).    - Allergy shots improve symptoms in 75-85% of patients.  - We can discuss more at the next appointment if the medications are not working for you.  3. Rash -If your symptoms re-occur, begin a journal of events that occurred for up to 6 hours before your symptoms began including foods and beverages consumed, soaps or perfumes you had contact with, and medications.  -Since you have not had side effects with Zyrtec and Allegra we will hold off on having you start an antihistamine to help  with the itching. -Because the rash is all over your body we will give you prednisone to start taking.  He will take prednisone 10 mg 2 tablets twice a day for 3 days, then on the fourth day take 2 tablets in the morning, then on the fifth day take 1 tablet and stop.  Please let us know if this does not get rid of the rash.  4.  Possible alpha gal -Avoid all red meat. In case of an allergic reaction, give Benadryl 4 teaspoonfuls every 4 hours, and if life-threatening symptoms occur, inject with EpiPen 0.3 mg. -Emergency action plan given.  Demonstration of how to use EpiPen given -Prescription sent for EpiPen. We will get lab work to confirm this allergy.  We will call you with results once they are back  5.  Eosinophilia-Elevated absolute eosinophil count - likely secondary to uncontrolled atopic disease, but could consider hypereosinophilic work-up in the future if indicated   Please let us know if this treatment plan is not working well for you. Schedule a follow-up appointment in 6 weeks or sooner if needed    Reducing Pollen Exposure  The American Academy of Allergy, Asthma and Immunology suggests the following steps to reduce your exposure to pollen during allergy seasons.    Do not hang sheets or clothing out to dry; pollen may collect on these items. Do not mow lawns or spend time around freshly cut grass; mowing stirs up pollen. Keep windows closed at night.  Keep car windows closed while driving. Minimize morning activities outdoors, a time when pollen counts are usually at their highest. Stay  indoors as much as possible when pollen counts or humidity is high and on windy days when pollen tends to remain in the air longer. Use air conditioning when possible.  Many air conditioners have filters that trap the pollen spores. Use a HEPA room air filter to remove pollen form the indoor air you breathe.  Control of Mold Allergen   Mold and fungi can grow on a variety of surfaces  provided certain temperature and moisture conditions exist.  Outdoor molds grow on plants, decaying vegetation and soil.  The major outdoor mold, Alternaria and Cladosporium, are found in very high numbers during hot and dry conditions.  Generally, a late Summer - Fall peak is seen for common outdoor fungal spores.  Rain will temporarily lower outdoor mold spore count, but counts rise rapidly when the rainy period ends.  The most important indoor molds are Aspergillus and Penicillium.  Dark, humid and poorly ventilated basements are ideal sites for mold growth.  The next most common sites of mold growth are the bathroom and the kitchen.  Outdoor (Seasonal) Mold Control  Positive outdoor molds via skin testing: Alternaria, Cladosporium, and Mucor  Use air conditioning and keep windows closed Avoid exposure to decaying vegetation. Avoid leaf raking. Avoid grain handling. Consider wearing a face mask if working in moldy areas.    Indoor (Perennial) Mold Control   Positive indoor molds via skin testing: Aspergillus, Penicillium, and Fusarium  Maintain humidity below 50%. Clean washable surfaces with 5% bleach solution. Remove sources e.g. contaminated carpets.    Control of Dust Mite Allergen    Dust mites play a major role in allergic asthma and rhinitis.  They occur in environments with high humidity wherever human skin is found.  Dust mites absorb humidity from the atmosphere (ie, they do not drink) and feed on organic matter (including shed human and animal skin).  Dust mites are a microscopic type of insect that you cannot see with the naked eye.  High levels of dust mites have been detected from mattresses, pillows, carpets, upholstered furniture, bed covers, clothes, soft toys and any woven material.  The principal allergen of the dust mite is found in its feces.  A gram of dust may contain 1,000 mites and 250,000 fecal particles.  Mite antigen is easily measured in the air during house  cleaning activities.  Dust mites do not bite and do not cause harm to humans, other than by triggering allergies/asthma.    Ways to decrease your exposure to dust mites in your home:  Encase mattresses, box springs and pillows with a mite-impermeable barrier or cover   Wash sheets, blankets and drapes weekly in hot water (130 F) with detergent and dry them in a dryer on the hot setting.  Have the room cleaned frequently with a vacuum cleaner and a damp dust-mop.  For carpeting or rugs, vacuuming with a vacuum cleaner equipped with a high-efficiency particulate air (HEPA) filter.  The dust mite allergic individual should not be in a room which is being cleaned and should wait 1 hour after cleaning before going into the room. Do not sleep on upholstered furniture (eg, couches).   If possible removing carpeting, upholstered furniture and drapery from the home is ideal.  Horizontal blinds should be eliminated in the rooms where the person spends the most time (bedroom, study, television room).  Washable vinyl, roller-type shades are optimal. Remove all non-washable stuffed toys from the bedroom.  Wash stuffed toys weekly like sheets and blankets above.  Reduce indoor humidity to less than 50%.  Inexpensive humidity monitors can be purchased at most hardware stores.  Do not use a humidifier as can make the problem worse and are not recommended.  Control of Dog or Cat Allergen  Avoidance is the best way to manage a dog or cat allergy. If you have a dog or cat and are allergic to dog or cats, consider removing the dog or cat from the home. If you have a dog or cat but dont want to find it a new home, or if your family wants a pet even though someone in the household is allergic, here are some strategies that may help keep symptoms at bay:  Keep the pet out of your bedroom and restrict it to only a few rooms. Be advised that keeping the dog or cat in only one room will not limit the allergens to that  room. Dont pet, hug or kiss the dog or cat; if you do, wash your hands with soap and water. High-efficiency particulate air (HEPA) cleaners run continuously in a bedroom or living room can reduce allergen levels over time. Regular use of a high-efficiency vacuum cleaner or a central vacuum can reduce allergen levels. Giving your dog or cat a bath at least once a week can reduce airborne allergen.  Control of Cockroach Allergen  Cockroach allergen has been identified as an important cause of acute attacks of asthma, especially in urban settings.  There are fifty-five species of cockroach that exist in the [place], however only three, the [place], [place] species produce allergen that can affect patients with Asthma.  Allergens can be obtained from fecal particles, egg casings and secretions from cockroaches.    Remove food sources. Reduce access to water. Seal access and entry points. Spray runways with 0.5-1% Diazinon or Chlorpyrifos Blow boric acid power under stoves and refrigerator. Place bait stations (hydramethylnon) at feeding sites.

## 2021-04-03 NOTE — Progress Notes (Incomplete)
FOLLOW UP  Date of Service/Encounter:  04/03/21   Assessment:   No diagnosis found.  Plan/Recommendations:    There are no Patient Instructions on file for this visit.   Subjective:   Thomas [name] is a 61 y.o. male presenting today for follow up of  Chief Complaint  Patient presents with   Rash    Was breaking out on his shoulder and it moves to the other side of his body. (Arms, back of his legs sides of his stomach and back. Not sure the cause    Asthma    No change in symptoms     Thomas [name] has a history of the following: Patient Active Problem List   Diagnosis Date Noted   Chronic sinusitis 12/12/2020   Acute on chronic diastolic HF (heart failure) ([place])    Acute hypoxemic respiratory failure ([place]) 01/24/2020   Obesity, Class III, BMI 40-49.9 (morbid obesity) ([place]) 05/05/2019   COPD exacerbation ([place]) 05/04/2019   Hypoxia    Leg edema    COPD with acute exacerbation ([place]) 09/16/2018   CKD (chronic kidney disease), stage III ([place]) 09/16/2018   Acute respiratory failure with hypoxia ([facility]) 09/16/2018   Allergic rhinitis 08/26/2018   ARF (acute renal failure) ([employer]) 10/17/2015   Hypertension    Asthmatic bronchitis , chronic ([facility])    GERD (gastroesophageal reflux disease)    Arthritis    Kidney stones    Prostate cancer ([place])    Thyroid nodule    OSA (obstructive sleep apnea)    Medial meniscus tear    Lateral meniscus tear    Sleep apnea 11/29/2009   HYPERSOMNIA 09/26/2009   DYSPNEA 09/26/2009   PROSTATE CANCER, HX OF 09/26/2009   Essential hypertension 09/25/2009    History obtained from: chart review and {Persons; PED relatives w/patient:19415::"patient"}.  Thomas [name] is a 61 y.o. male presenting for {Blank single:19197::"a food challenge","a drug challenge","skin testing","a sick visit","an evaluation of ***","a follow up visit"}.  {Blank single:19197::"Asthma/Respiratory Symptom History: ***"," "}  {Blank  single:19197::"Allergic Rhinitis Symptom History: ***"," "}  {Blank single:19197::"Food Allergy Symptom History: ***"," "}  {Blank single:19197::"Skin Symptom History: ***"," "}  {Blank single:19197::"GERD Symptom History: ***"," "}  Otherwise, there have been no changes to his past medical history, surgical history, family history, or social history.    ROS     Objective:   There were no vitals taken for this visit. There is no height or weight on file to calculate BMI.   Physical Exam:  Physical Exam   Diagnostic studies: {Blank single:19197::"none","deferred due to recent antihistamine use","labs sent instead"," "}  Spirometry: {Blank single:19197::"results normal (FEV1: ***%, FVC: ***%, FEV1/FVC: ***%)","results abnormal (FEV1: ***%, FVC: ***%, FEV1/FVC: ***%)"}.    {Blank single:19197::"Spirometry consistent with mild obstructive disease","Spirometry consistent with moderate obstructive disease","Spirometry consistent with severe obstructive disease","Spirometry consistent with possible restrictive disease","Spirometry consistent with mixed obstructive and restrictive disease","Spirometry uninterpretable due to technique","Spirometry consistent with normal pattern"}. {Blank single:19197::"Albuterol/Atrovent nebulizer","Xopenex/Atrovent nebulizer","Albuterol nebulizer","Albuterol four puffs via MDI","Xopenex four puffs via MDI"} treatment given in clinic with {Blank single:19197::"significant improvement in FEV1 per ATS criteria","significant improvement in FVC per ATS criteria","significant improvement in FEV1 and FVC per ATS criteria","improvement in FEV1, but not significant per ATS criteria","improvement in FVC, but not significant per ATS criteria","improvement in FEV1 and FVC, but not significant per ATS criteria","no improvement"}.  Allergy Studies: {Blank single:19197::"none","labs sent instead"," "}    {Blank single:19197::"Allergy testing results were read and  interpreted by myself, documented by clinical staff."," "}  Salvatore Marvel, MD  [facility]

## 2021-04-03 NOTE — Progress Notes (Signed)
[place], [place] [address] Dept: [phone]  FOLLOW UP NOTE  Patient ID: Thomas [name], male    DOB: 1960-12-06  Age: 61 y.o. MRN: [id number] Date of Office Visit: 04/03/2021  Assessment  Chief Complaint: Rash (Was breaking out on his shoulder and it moves to the other side of his body. (Arms, back of his legs sides of his stomach and back. Not sure the cause. Takes benadryl and calamine lotion ) and Asthma (No change in symptoms )  HPI Thomas [name] is a 61 year old male who presents today for follow-up of asthma/COPD overlap syndrome with a component of reversibility today, history of smoking, seasonal and perennial allergic rhinitis, elevated absolute eosinophil count, likely secondary to uncontrolled atopic disease, but could consider hypereosinophilic work-up in the future if indicated.  He was last seen on February 20, 2021 by Dr. [name].  Since his last office visit he denies any new diagnoses or surgeries.  He reports a couple weeks after getting skin tested for environmental allergens that he developed a rash in his left shoulder region that has spread to his right arm, chest, bilateral shins, and the right side of his abdomen.  That rash is itchy and does not burn.  He has tried calamine lotion which will work for a short period of time.  He also is taking Benadryl as needed to help sleep at night due to the itching.  He denies any new foods or correlation with foods, no new medications, and no new products/detergents.  He denies any recent insect bites and was not sick prior to the rash occurring.  No one else in the household has this rash, and he denies being out in the woods.   He has had a rash before but it did not last as long.  He mentions that if he eats a medium rare steak he breaks out in hives.  He will take Benadryl and the rash will be gone the next day.  He denies any concomitant cardiorespiratory and gastrointestinal symptoms.  He mentions  that he has never officially been diagnosed with alpha gal but he has had several friends have this so he has just been avoiding red meat.  He does mention that he will occasionally eat a burger because he wants one.  He does not have an epinephrine autoinjector device.  He had 3 tick bites this last summer.  He mainly been sticking with eating chicken, fish, and shrimp.  He denies eating any red meat before this recent rash occurred.  He continues to take Breztri 2 puffs twice a day, but does not use a spacer.  He also takes Singulair 10 mg once a day and albuterol as needed.  He reports a cough that can be dry and also productive.  When the cough is productive it can be clear to dark gray in color.  He reports wheezing and shortness of breath.  He denies fever, chills, tightness in his chest and nocturnal awakenings due to breathing problems.  Since his last office visit he has not required any systemic steroids or made any trips to the emergency room or urgent care due to breathing problems.  He uses albuterol inhaler a couple times a week.  It depends on what he is doing.  He continues to follow-up with Dr. Shyrl Numbers, his pulmonologist.  He has not heard from anyone concerning starting Berna Bue, but mentions that he would like to get this rash taking care of first.  Allergic rhinitis is reported as controlled since stopping both Zyrtec and Allegra.  He does take Singulair 10 mg once a day.  He mentions that since stopping Allegra and Zyrtec he has not had any rhinorrhea or nasal congestion.  When asked if he has postnasal drip he reports that he thinks so because he has to lean forward to breathe easier.  He mentions that when he took Zyrtec it caused him to be drowsy and coughing all the time so he stopped this medication.  When he tried Allegra it caused cramps and he could not sleep.  He tried taking half of Allegra  the cramps stopped but he still could not sleep.   Drug Allergies:  No Known  Allergies  Review of Systems: Review of Systems  Constitutional:  Negative for chills and fever.  HENT:         Denies rhinorrhea, nasal congestion, and postnasal drip since stopping both Zyrtec and Allegra  Respiratory:  Positive for cough, shortness of breath and wheezing.        Reports cough that can be dry at times and productive at times.  When it is productive it can be clear to dark gray.  He also reports wheezing and shortness of breath.  Denies tightness in his chest and nocturnal awakenings due to breathing problems.  Cardiovascular:  Negative for chest pain and palpitations.  Gastrointestinal:        Reports reflux from time to time and has a prescription for Pepcid to use as needed  Genitourinary:  Negative for frequency.  Skin:  Positive for itching and rash.       Reports itchy rash that started a couple weeks after being skin tested for environmental allergens  Neurological:  Negative for headaches.  Endo/Heme/Allergies:  Positive for environmental allergies.    Physical Exam: BP 130/88    Pulse 81    Temp 99.6 F (37.6 C)    Resp 16    Ht 5\' 8"  (1.727 m)    Wt 297 lb (134.7 kg)    SpO2 96%    BMI 45.16 kg/m    Physical Exam Constitutional:      Appearance: Normal appearance.  HENT:     Head: Normocephalic and atraumatic.     Comments: Pharynx normal, eyes normal, ears normal, nose normal    Right Ear: Tympanic membrane, ear canal and external ear normal.     Left Ear: Tympanic membrane, ear canal and external ear normal.     Nose: Nose normal.     Mouth/Throat:     Mouth: Mucous membranes are moist.     Pharynx: Oropharynx is clear.  Eyes:     Conjunctiva/sclera: Conjunctivae normal.  Cardiovascular:     Rate and Rhythm: Regular rhythm.     Heart sounds: Normal heart sounds.  Pulmonary:     Effort: Pulmonary effort is normal.     Breath sounds: Normal breath sounds.     Comments: Lungs clear to auscultation Musculoskeletal:     Cervical back: Neck  supple.  Skin:    General: Skin is warm.     Comments: Fine papular/slightly erythematous rash noted on left shoulder region and right arm.  He reports that he also has same rash on his chest, right side of his abdomen, and bilateral shins.  Neurological:     Mental Status: He is alert and oriented to person, place, and time.  Psychiatric:        Mood and Affect: Mood  normal.        Behavior: Behavior normal.        Thought Content: Thought content normal.        Judgment: Judgment normal.    Diagnostics: FVC 2.79 L, FEV1 1.78 L (53%).  Predicted FVC 4.30 L, predicted FEV1 3.34 L.  Spirometry indicates possible moderately severe obstruction and restriction.  Spirometry is consistent with previous spirometry.  Assessment and Plan: 1. Asthma-COPD overlap syndrome ([place])   2. Rash   3. Seasonal and perennial allergic rhinitis   4. Allergy to alpha-gal     Meds ordered this encounter  Medications   EPINEPHrine (EPIPEN 2-PAK) 0.3 mg/0.3 mL IJ SOAJ injection    Sig: Inject 0.3 mg into the muscle once for 1 dose.    Dispense:  2 each    Refill:  1   Spacer/Aero-Holding Chambers DEVI    Sig: 1 Device by Does not apply route as directed.    Dispense:  1 each    Refill:  1    Patient Instructions  1. Asthma-COPD overlap syndrome -Consider to think about Fasenra injections.  I will send a message to Tammy, our Biologics coordinator, since you have not heard from her - Daily controller medication(s): Breztri two puffs twice with spacer and Singulair (montelukast) 10mg  daily. We will send in a prescription for a spacer since you do not have one. - Prior to physical activity: albuterol 2 puffs 10-15 minutes before physical activity. - Rescue medications: albuterol 4 puffs every 4-6 hours as needed - Asthma control goals:  * Full participation in all desired activities (may need albuterol before activity) * Albuterol use two time or less a week on average (not counting use with  activity) * Cough interfering with sleep two time or less a month * Oral steroids no more than once a year * No hospitalizations  2. Seasonal and perennial allergic rhinitis (grasses, ragweed, weeds, trees, indoor molds, outdoor molds, dust mites, cat, dog, and cockroach) - Stop taking: All antihistamines since you have had side effects with both Zyrtec and Allegra and you are currently not having any symptoms - Continue with: Singulair (montelukast) 10mg  daily - Consider allergy shots as a means of long-term control. - Allergy shots "re-train" and "reset" the immune system to ignore environmental allergens and decrease the resulting immune response to those allergens (sneezing, itchy watery eyes, runny nose, nasal congestion, etc).    - Allergy shots improve symptoms in 75-85% of patients.  - We can discuss more at the next appointment if the medications are not working for you.  3. Rash -If your symptoms re-occur, begin a journal of events that occurred for up to 6 hours before your symptoms began including foods and beverages consumed, soaps or perfumes you had contact with, and medications.  -Since you have not had side effects with Zyrtec and Allegra we will hold off on having you start an antihistamine to help with the itching. -Because the rash is all over your body we will give you prednisone to start taking.  He will take prednisone 10 mg 2 tablets twice a day for 3 days, then on the fourth day take 2 tablets in the morning, then on the fifth day take 1 tablet and stop.  Please let us know if this does not get rid of the rash.  4.  Possible alpha gal -Avoid all red meat. In case of an allergic reaction, give Benadryl 4 teaspoonfuls every 4 hours, and if life-threatening symptoms  occur, inject with EpiPen 0.3 mg. -Emergency action plan given.  Demonstration of how to use EpiPen given -Prescription sent for EpiPen. We will get lab work to confirm this allergy.  We will call you with  results once they are back  5.  Eosinophilia-Elevated absolute eosinophil count - likely secondary to uncontrolled atopic disease, but could consider hypereosinophilic work-up in the future if indicated   Please let us know if this treatment plan is not working well for you. Schedule a follow-up appointment in 6 weeks or sooner if needed    Reducing Pollen Exposure  The American Academy of Allergy, Asthma and Immunology suggests the following steps to reduce your exposure to pollen during allergy seasons.    Do not hang sheets or clothing out to dry; pollen may collect on these items. Do not mow lawns or spend time around freshly cut grass; mowing stirs up pollen. Keep windows closed at night.  Keep car windows closed while driving. Minimize morning activities outdoors, a time when pollen counts are usually at their highest. Stay indoors as much as possible when pollen counts or humidity is high and on windy days when pollen tends to remain in the air longer. Use air conditioning when possible.  Many air conditioners have filters that trap the pollen spores. Use a HEPA room air filter to remove pollen form the indoor air you breathe.  Control of Mold Allergen   Mold and fungi can grow on a variety of surfaces provided certain temperature and moisture conditions exist.  Outdoor molds grow on plants, decaying vegetation and soil.  The major outdoor mold, Alternaria and Cladosporium, are found in very high numbers during hot and dry conditions.  Generally, a late Summer - Fall peak is seen for common outdoor fungal spores.  Rain will temporarily lower outdoor mold spore count, but counts rise rapidly when the rainy period ends.  The most important indoor molds are Aspergillus and Penicillium.  Dark, humid and poorly ventilated basements are ideal sites for mold growth.  The next most common sites of mold growth are the bathroom and the kitchen.  Outdoor (Seasonal) Mold Control  Positive  outdoor molds via skin testing: Alternaria, Cladosporium, and Mucor  Use air conditioning and keep windows closed Avoid exposure to decaying vegetation. Avoid leaf raking. Avoid grain handling. Consider wearing a face mask if working in moldy areas.    Indoor (Perennial) Mold Control   Positive indoor molds via skin testing: Aspergillus, Penicillium, and Fusarium  Maintain humidity below 50%. Clean washable surfaces with 5% bleach solution. Remove sources e.g. contaminated carpets.    Control of Dust Mite Allergen    Dust mites play a major role in allergic asthma and rhinitis.  They occur in environments with high humidity wherever human skin is found.  Dust mites absorb humidity from the atmosphere (ie, they do not drink) and feed on organic matter (including shed human and animal skin).  Dust mites are a microscopic type of insect that you cannot see with the naked eye.  High levels of dust mites have been detected from mattresses, pillows, carpets, upholstered furniture, bed covers, clothes, soft toys and any woven material.  The principal allergen of the dust mite is found in its feces.  A gram of dust may contain 1,000 mites and 250,000 fecal particles.  Mite antigen is easily measured in the air during house cleaning activities.  Dust mites do not bite and do not cause harm to humans, other than by triggering  allergies/asthma.    Ways to decrease your exposure to dust mites in your home:  Encase mattresses, box springs and pillows with a mite-impermeable barrier or cover   Wash sheets, blankets and drapes weekly in hot water (130 F) with detergent and dry them in a dryer on the hot setting.  Have the room cleaned frequently with a vacuum cleaner and a damp dust-mop.  For carpeting or rugs, vacuuming with a vacuum cleaner equipped with a high-efficiency particulate air (HEPA) filter.  The dust mite allergic individual should not be in a room which is being cleaned and should wait 1  hour after cleaning before going into the room. Do not sleep on upholstered furniture (eg, couches).   If possible removing carpeting, upholstered furniture and drapery from the home is ideal.  Horizontal blinds should be eliminated in the rooms where the person spends the most time (bedroom, study, television room).  Washable vinyl, roller-type shades are optimal. Remove all non-washable stuffed toys from the bedroom.  Wash stuffed toys weekly like sheets and blankets above.   Reduce indoor humidity to less than 50%.  Inexpensive humidity monitors can be purchased at most hardware stores.  Do not use a humidifier as can make the problem worse and are not recommended.  Control of Dog or Cat Allergen  Avoidance is the best way to manage a dog or cat allergy. If you have a dog or cat and are allergic to dog or cats, consider removing the dog or cat from the home. If you have a dog or cat but dont want to find it a new home, or if your family wants a pet even though someone in the household is allergic, here are some strategies that may help keep symptoms at bay:  Keep the pet out of your bedroom and restrict it to only a few rooms. Be advised that keeping the dog or cat in only one room will not limit the allergens to that room. Dont pet, hug or kiss the dog or cat; if you do, wash your hands with soap and water. High-efficiency particulate air (HEPA) cleaners run continuously in a bedroom or living room can reduce allergen levels over time. Regular use of a high-efficiency vacuum cleaner or a central vacuum can reduce allergen levels. Giving your dog or cat a bath at least once a week can reduce airborne allergen.  Control of Cockroach Allergen  Cockroach allergen has been identified as an important cause of acute attacks of asthma, especially in urban settings.  There are fifty-five species of cockroach that exist in the [place], however only three, the [place], [place]  species produce allergen that can affect patients with Asthma.  Allergens can be obtained from fecal particles, egg casings and secretions from cockroaches.    Remove food sources. Reduce access to water. Seal access and entry points. Spray runways with 0.5-1% Diazinon or Chlorpyrifos Blow boric acid power under stoves and refrigerator. Place bait stations (hydramethylnon) at feeding sites.          Return in about 6 weeks (around 05/15/2021), or if symptoms worsen or fail to improve.    Thank you for the opportunity to care for this patient.  Please do not hesitate to contact me with questions.  Althea Charon, FNP [facility]

## 2021-04-04 MED ORDER — PREDNISONE 10 MG PO TABS: ORAL_TABLET | ORAL | 0 refills | Status: DC

## 2021-04-08 ENCOUNTER — Telehealth: Payer: Self-pay | Admitting: *Deleted

## 2021-04-08 NOTE — Telephone Encounter (Signed)
-----   Message from Althea Charon, [place] sent at 04/03/2021  7:09 PM EST ----- Please contact Merrel concerning possible Berna Bue start. He reports that he has not heard from anyone since his last office visit.

## 2021-04-08 NOTE — Telephone Encounter (Signed)
Called patient and and advised approval, copay card and submit to [facility] patientgiven instrux on delivery,storage and appt in clinic for initial dose

## 2021-04-08 NOTE — Telephone Encounter (Signed)
Thank you :)

## 2021-04-09 LAB — ALPHA-GAL PANEL
Allergen Lamb IgE: 7.67 kU/L — AB
Beef IgE: 19.7 kU/L — AB
IgE (Immunoglobulin E), Serum: 195 IU/mL (ref 6–495)
O215-IgE Alpha-Gal: 63 kU/L — AB
Pork IgE: 3.96 kU/L — AB

## 2021-04-09 NOTE — Progress Notes (Signed)
Thank you :)

## 2021-04-09 NOTE — Progress Notes (Signed)
Please let Thomas [name] know that his alpha gal lab work was positive. It is very important that he avoids all mammalian meats, such as beef, pork, and lamb. Did he pick up his epinephrine auto-injector device?  How is his rash?

## 2021-04-12 NOTE — Progress Notes (Signed)
Ok. Thank you.

## 2021-04-19 ENCOUNTER — Telehealth: Payer: Self-pay | Admitting: *Deleted

## 2021-04-19 NOTE — Telephone Encounter (Signed)
Letter to patients home in regards to lab results.

## 2021-04-19 NOTE — Telephone Encounter (Signed)
Patient returned phone call and was advised of lab results. Patient verbalized understanding. He stated that he was not going to pick up an EpiPen due to cost but would try to be careful of cross contamination of red meats.

## 2021-05-15 ENCOUNTER — Ambulatory Visit: Payer: 59 | Admitting: Allergy & Immunology

## 2021-06-19 ENCOUNTER — Ambulatory Visit: Payer: 59 | Admitting: Internal Medicine

## 2021-06-19 ENCOUNTER — Encounter: Payer: Self-pay | Admitting: Internal Medicine

## 2021-06-19 DIAGNOSIS — J449 Chronic obstructive pulmonary disease, unspecified: Secondary | ICD-10-CM | POA: Diagnosis not present

## 2021-06-19 DIAGNOSIS — J329 Chronic sinusitis, unspecified: Secondary | ICD-10-CM

## 2021-06-19 NOTE — Progress Notes (Signed)
?  ? ?Thomas [name], male    DOB: 1961/02/07,     MRN: [id number] ? ? ?Brief patient profile:  ?61  yowm quit smoking 1995 at wt  215  former pt of [facility] with AB with no copd by pfts 10/2018 and working dx of AB.  ? ? ? ? ?History of Present Illness  ?07/31/2020  Pulmonary/ 1st office eval/ Thomas [name] / [name]  ?Chief Complaint  ?Patient presents with  ? Follow-up  ?  Former pt of [name]. He states that his breathing has been progressively worse since the last visit in 2020. He states he has been on pred and doxy off and on and these help but only temporarily. He is currently on 40 mg pred daily. He has some prod cough with white to brown sputum and also wheezing. He is using his albuterol inhaler 2 x per wk and has duoneb he uses 3-4 x per day.   ?Dyspnea:  On good days can across the parking lot/  [employer] shopping / incline to MB s stopping while on trelegy/ and daily neb albuterol worse than usual since March 2021 with flares every few weeks unless stays on prednisone  ?Cough: esp in am dark yellow assoc with intermittent nasal discharge / poor dentition  ?Sleep: bed is flat right side down ?SABA use: neb  ?No 02  ?Rec ?For cough/ congestion > mucineX 1200 mg every 12 hours ?Plan A = Automatic = Always=    Breztri Take 2 puffs first thing in am and then another 2 puffs about 12 hours later.  ?Work on inhaler technique:  ?Finish up your prednisone  ?Plan B = Backup (to supplement plan A, not to replace it) ?Only use your albuterol inhaler as a rescue medication  ?Plan C = Crisis (instead of Plan B but only if Plan B stops working) ?- only use your albuterol nebulizer if you first try Plan B and it fails to help > ok to use the nebulizer up to every 4 hours but if start needing it regularly call for immediate appointment ?GERD diet  ?Stop trelegy ?We will call to schedule a sinus CT   ?1. Moderate inferior right maxillary sinus mucosal thickening, which ?is indeterminate but potential odontogenic given  adjacent carious ?right maxillary molar. Ossific fragment in the posterior/inferior ?right maxillary sinus may represent a tooth fragment. ?2. Otherwise, mild paranasal sinus mucosal thickening with air-fluid ?level in the left maxillary sinus. ?3. Evidence of prior endoscopic sinus surgery with right maxillary ?antrostomy and right ethmoidectomies  >  ENT/dental eval ?  ? ? ?08/22/2020  f/u ov/[place] office/[name] re: worse sob/cough x 08/19/20 flare ?Thomas [name] eval pending for ongong sinusitis  ?Chief Complaint  ?Patient presents with  ? Follow-up  ?  Cough with yellow sputum past 2-3 days. He is also more SOB and has been wheezing. He feels like leaning forward helps him to breathe better. He has had some swelling in his feet. He has used his rescue inhaler once since these symptoms started.   ? Dyspnea:  room to room ?Cough: congested yellow mucus x3 days  ?Sleeping: bed is flat with 1 one pillow rotated on side or prone  ?SABA use: none today  ?02: none  ?Covid status: x 2  ?Rec ?Plan A = Automatic = Always=    Breztri Take 2 puffs first thing in am and then another 2 puffs about 12 hours later.  ? Work on inhaler technique:   ?Plan  B = Backup (to supplement plan A, not to replace it) ?Only use your albuterol inhaler as a rescue medication  ?Plan C = Crisis (instead of Plan B but only if Plan B stops working) ?- only use your albuterol nebulizer if you first try Plan B and it fails to help  ?For cough > mucinex dm 1200 mg every 12 hours and use flutter valve as much as possible  ?Late add  : augmentin x 14 more days and keep Thomas [name]/ dental appts  ? ? ?09/20/20 Thomas [name] eval f/u rec flonase  ? ? ?10/10/2020  f/u ov/[name] office/Thomas [name] re: AB/ chronic sinusitis maint on  breztri 2 bid   ?Chief Complaint  ?Patient presents with  ? Follow-up  ?  Breathing has improved since the last visit. He c/o cough with white to grey sputum and has occ wheezing. He has not used his albuterol.   ? Dyspnea:  [employer] pushing buggy ?Cough:  after supper  ?Sleeping: better  ?SABA use: much less ?02: none  ?Covid status: x 3  ?Rec ?Allergy profile 10/10/2020  ? ? ?12/11/2020  f/u ov/[place] office/Thomas [name] re: AB/MO maint on breztri 2bid   ?Chief Complaint  ?Patient presents with  ? Follow-up  ?  Patient says he is still struggling to breathe occasionally but uses nebulizer and inhaler and says it helps. Coughing up white/gray mucus that he says seems thicker than it used to.   ?Dyspnea:  [employer] shopping / crosses parking lot ok s 02/ never pretreats with saba  ?Cough: variable / some worse in am but nothing purulent assoc with nasal congestion   ?Sleeping: better / bed is flat one big pillow  ?SABA use: using rarely  ?02: none  ?Covid status: x 3  ?Rec ?Prednisone 10 mg take  4 each am x 2 days,   2 each am x 2 days,  1 each am x 2 days and stop  ?I will be referring you to allergist = [name] to eval you further for pos allergy testing  ?Try adding every day >>> prilosec otc '20mg'$   Take 30-60 min before first meal of the day and Pepcid ac (famotidine) 20 mg one @  supper x one month to see if helps breathing/coughing/ wheezing   ?GERD diet reviewed, bed blocks rec  ?Please schedule a follow up visit in 6  months but call sooner if needed  ? ?  ?06/19/2021  f/u ov/[place] office/Thomas [name] re: AB maint on canadian breztri  / no better   ?Chief Complaint  ?Patient presents with  ? Follow-up  ?  Cough  has improved sob has worsened since last ov  ? Dyspnea:  still doing [employer] but struggling  ?Cough: congested rattling min mucoid ?Sleeping: bed is flat, sleeping  on side or prone ?SABA use: no better p sab  ?02: none  ?Covid status: vax x all of them  ?  ? ? ?No obvious day to day or daytime variability or assoc excess/ purulent sputum or mucus plugs or hemoptysis or cp or chest tightness, subjective wheeze or overt sinus or hb symptoms.  ? ?Sleeping  without nocturnal  or early am exacerbation  of respiratory  c/o's or need for noct saba. Also denies any  obvious fluctuation of symptoms with weather or environmental changes or other aggravating or alleviating factors except as outlined above  ? ?No unusual exposure hx or h/o childhood pna/ asthma or knowledge of premature birth. ? ?Current Allergies, Complete Past Medical History, Past  Surgical History, Family History, and Social History were reviewed in [employer] record. ? ?ROS  The following are not active complaints unless bolded ?Hoarseness, sore throat, dysphagia, dental problems, itching, sneezing,  nasal congestion or discharge of excess mucus or purulent secretions, ear ache,   fever, chills, sweats, unintended wt loss or wt gain, classically pleuritic or exertional cp,  orthopnea pnd or arm/hand swelling  or leg swelling, presyncope, palpitations, abdominal pain, anorexia, nausea, vomiting, diarrhea  or change in bowel habits or change in bladder habits, change in stools or change in urine, dysuria, hematuria,  rash, arthralgias, visual complaints, headache, numbness, weakness or ataxia or problems with walking or coordination,  change in mood or  memory. ?      ? ?Current Meds  ?Medication Sig  ? albuterol (PROVENTIL) (2.5 MG/3ML) 0.083% nebulizer solution Take 3 mLs (2.5 mg total) by nebulization every 4 (four) hours as needed for wheezing or shortness of breath.  ? Albuterol Sulfate 108 (90 Base) MCG/ACT AEPB Inhale 2 puffs into the lungs every 6 (six) hours as needed (sob, wheeze).  ? allopurinol (ZYLOPRIM) 300 MG tablet Take 150 mg by mouth daily.  ? Budeson-Glycopyrrol-Formoterol (BREZTRI AEROSPHERE) 160-9-4.8 MCG/ACT AERO Take 2 puffs first thing in am and then another 2 puffs about 12 hours later.  ? metoprolol tartrate (LOPRESSOR) 25 MG tablet Take 1 tablet (25 mg total) by mouth 2 (two) times daily.  ? montelukast (SINGULAIR) 10 MG tablet Take 10 mg by mouth at bedtime.  ? oxyCODONE-acetaminophen (PERCOCET) 10-325 MG tablet Take 1 tablet by mouth every 4 (four) hours as  needed for pain.  ? Spacer/Aero-Holding Chambers DEVI 1 Device by Does not apply route as directed.  ? torsemide (DEMADEX) 20 MG tablet Take 20 mg by mouth daily.  ?     ? ?  ?   ? ?Past Medical History:

## 2021-06-19 NOTE — Patient Instructions (Addendum)
When cough flares adding every day >>> prilosec otc '20mg'$   Take 30-60 min before first meal of the day and Pepcid ac (famotidine) 20 mg one @  supper x one month to see if helps breathing/coughing/ wheezing   ? ?GERD (REFLUX)  is an extremely common cause of respiratory symptoms just like yours , many times with no obvious heartburn at all.  ? ? It can be treated with medication, but also with lifestyle changes including elevation of the head of your bed (ideally with 6 -8inch blocks under the headboard of your bed),  Smoking cessation, avoidance of late meals, excessive alcohol, and avoid fatty foods, chocolate, peppermint, colas, red wine, and acidic juices such as orange juice.  ?NO MINT OR MENTHOL PRODUCTS SO NO COUGH DROPS  ?USE SUGARLESS CANDY INSTEAD (Jolley ranchers or Stover's or Life Savers) or even ice chips will also do - the key is to swallow to prevent all throat clearing. ?NO OIL BASED VITAMINS - use powdered substitutes.  Avoid fish oil when coughing.  ? ? ?You will need to reconsider starting biologics for asthma but they are expensive and should be thru Dr [name].  ?

## 2021-06-20 ENCOUNTER — Encounter: Payer: Self-pay | Admitting: Internal Medicine

## 2021-06-20 NOTE — Assessment & Plan Note (Addendum)
Quit smoking 1995 at wt 215  ?- pfts 10/27/2018 nl including f/v loop x for EV 48% at wt = 277  ?- 07/31/2020  After extensive coaching inhaler device,  effectiveness =    80% so try change from trelegy to breztri 2bid  ?- CT sinus 08/09/20 pos > [name] eval (see chronic sinusitis  ?- flare x 08/14/20 rx augmentin x 21 day and pred x 6 days  ?- Allergy profile 10/10/2020 >  Eos 1.5  /  IgE  296> allergy referral 12/11/2020  ?- 06/19/2021 referred back to Dr [name] to consider biologics  ? ?- The proper method of use, as well as anticipated side effects, of a metered-dose inhaler were discussed and demonstrated to the patient using teach back method. Improved effectiveness after extensive coaching during this visit to a level of approximately 90 %  ? ?Refractory symptoms appear to be mostly attributable to the upper airway at this point and have not responded to breztri/singulair or gerd rx so  Will direct pt back to Dr [name] to consider biologics if at all feasible under his present insurance plan (which pt may need to switch when possible so he finds one that does cover the cost) ?

## 2021-06-20 NOTE — Assessment & Plan Note (Signed)
-   CT sinus 08/09/20 pos > [name] eval   ?- flare x 08/14/20 rx augmentin x 21 day and pred x 6 days  ?- 09/20/20 [name] eval f/u rec flonase  ? ?rec continued f/u with Dr Benjamine Mola ? ?    ?  ? ?Each maintenance medication was reviewed in detail including emphasizing most importantly the difference between maintenance and prns and under what circumstances the prns are to be triggered using an action plan format where appropriate. ? ?Total time for H and P, chart review, counseling,  and generating customized AVS unique to this office visit / same day charting = 22 min  ?     ?

## 2021-06-21 ENCOUNTER — Telehealth: Payer: Self-pay | Admitting: Allergy & Immunology

## 2021-06-21 NOTE — Telephone Encounter (Signed)
-----   Message from Valentina Shaggy, MD sent at 06/20/2021  8:19 AM EDT ----- ?Great, thanks Dr. [name]!  ? ?We will work on that! Front Desk - can you make sure that he has a follow up appointment?  ? ?Salvatore Marvel, MD ?[facility] ? ?----- Message ----- ?From: Tanda Rockers, MD ?Sent: 06/20/2021   5:51 AM EDT ?To: Valentina Shaggy, MD ? ?Very poorly controlled cough that I suspect is largely upper airway related  ? ?His sinus CT looked bad but [name] just rec Flonase (per pt hx) ?- ?? If any way we can get him on one of the biologics x 3 m trial??? ? ? ? ?

## 2021-06-21 NOTE — Telephone Encounter (Signed)
Left message for patient to call back and make appointment.

## 2021-08-21 ENCOUNTER — Telehealth: Payer: Self-pay | Admitting: Internal Medicine

## 2021-08-21 ENCOUNTER — Encounter: Payer: Self-pay | Admitting: Internal Medicine

## 2021-08-21 NOTE — Telephone Encounter (Signed)
ATC Rosemary back. LVM letting her know she will have to call medical records with [facility] to get patients medical records. [phone]. Left number on her VM and advised her to call back for further questions. Will close encounter.

## 2021-08-26 ENCOUNTER — Telehealth: Payer: Self-pay | Admitting: Internal Medicine

## 2021-08-26 NOTE — Telephone Encounter (Signed)
We received a faxed request from Thomas [name] at [facility] asking for office notes and test results starting in 2020.  I decided to print the documents (47 pages).  I spoke to [facility] and she stated her fax machine is broken so I scanned the docs and sent them via secure email.  I also explained to her that any additional [facility] record requests will need to go directly to the provider offices or to [facility] Records request center.   Thomas [name] ph# [phone]  email: [email or web address]

## 2021-08-28 NOTE — Telephone Encounter (Signed)
Closing encounter as it was created in error.

## 2021-09-17 ENCOUNTER — Telehealth: Payer: Self-pay | Admitting: Internal Medicine

## 2021-09-18 NOTE — Telephone Encounter (Signed)
Called patient to notify that we did not receive a fax from his pharmacy yesterday. Asked him to verify his pharmacy and he states the pharmacy is a mail order called [facility] and their phone number is [phone].   Called [facility] pharmacy and spoke to [place]. She states they dont e-scribe and she will have to fax the refill request. I asked her to fax it to the [place] office so I can look out for it. Gave her [facility] office fax number. Per Morey Hummingbird the refill request needs a signature and to be faxed back. Will have Dr. [name] to sign today when he comes in.

## 2021-09-20 NOTE — Telephone Encounter (Signed)
Called and spoke with another pharmacy tech today (unsure of name?) and I informed her I never received the refill request that they need Dr. [name] to sign. She states she will fax it again. Gave her [facility] office fax number. Will look out for new refill request.

## 2021-09-20 NOTE — Progress Notes (Signed)
Noted  

## 2021-09-23 NOTE — Telephone Encounter (Signed)
Paper received from pharmacy but will have to fax to [place] office to have Dr. [name] sign.

## 2021-09-24 NOTE — Telephone Encounter (Signed)
Paper was signed by Dr. [name] and faxed to [place] at [phone].  ATC and notify patient. Left a vm (ok per dpr) letting him know refill request was handled and advised him to call back for further questions. Nothing further needed at this time.

## 2021-10-02 ENCOUNTER — Ambulatory Visit: Payer: 59 | Admitting: Allergy & Immunology

## 2021-10-02 VITALS — BP 142/100 | HR 88 | Temp 99.5°F | Resp 16 | Ht 68.47 in | Wt 299.0 lb

## 2021-10-02 DIAGNOSIS — J449 Chronic obstructive pulmonary disease, unspecified: Secondary | ICD-10-CM | POA: Diagnosis not present

## 2021-10-02 DIAGNOSIS — R21 Rash and other nonspecific skin eruption: Secondary | ICD-10-CM | POA: Diagnosis not present

## 2021-10-02 DIAGNOSIS — Z91018 Allergy to other foods: Secondary | ICD-10-CM

## 2021-10-02 DIAGNOSIS — J3089 Other allergic rhinitis: Secondary | ICD-10-CM

## 2021-10-02 MED ORDER — ALBUTEROL SULFATE 108 (90 BASE) MCG/ACT IN AEPB: 2.0000 | INHALATION_SPRAY | Freq: Four times a day (QID) | RESPIRATORY_TRACT | 1 refills | Status: DC | PRN

## 2021-10-02 NOTE — Progress Notes (Signed)
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 Asthma-COPD overlap syndrome - Lung testing looked much better today!  - I talked to [facility] and she said that she would wait until next month and we will resubmit for Nucala (this is their preferred first line treatment).  - I am going to get another complete blood count so we have all of our ducks in a row when the time comes. - Information on Nucala provided today.  - Daily controller medication(s): Breztri two puffs twice with spacer and Singulair (montelukast) '10mg'$  daily (sample provided to help save some moolah). - Prior to physical activity: albuterol 2 puffs 10-15 minutes before physical activity. - Rescue medications: albuterol 4 puffs every 4-6 hours as needed - Asthma control goals:  * Full participation in all desired activities (may need albuterol before activity) * Albuterol use two time or less a week on average (not counting use with activity) * Cough interfering with sleep two time or less a month * Oral steroids no more than once a year * No hospitalizations  2. Seasonal and perennial allergic rhinitis - Previous testing showed: grasses, ragweed, weeds, trees, indoor molds, outdoor molds, dust mites, cat, dog, and cockroach. - Continue with: Singulair (montelukast) '10mg'$  daily - Continue with: Zyrtec (cetirizine) '10mg'$  tablet once daily - You can use an extra dose of the  antihistamine, if needed, for breakthrough symptoms.  - Consider nasal saline rinses 1-2 times daily to remove allergens from the nasal cavities as well as help with mucous clearance (this is especially helpful to do before the nasal sprays are given) - Consider allergy shots as a means of long-term control. - Allergy shots "re-train" and "reset" the immune system to ignore environmental allergens and decrease the resulting immune response to those allergens (sneezing, itchy watery eyes, runny nose, nasal congestion, etc).    - Allergy shots improve symptoms in 75-85% of patients.  - We can discuss more at the next appointment if the medications are not working for you. - Allergy shots can help with asthma as well as allergies, so this might be a good option for you.  3. Return in about 4 months (around 02/01/2022).     Subjective:   Thomas [name] is a 61 y.o. male presenting today for follow up of  Chief Complaint  Patient presents with   COPD    Says it is the same. Slight wheezing.     Thomas [name] has a history of the following: Patient Active Problem List   Diagnosis Date Noted   Chronic sinusitis 12/12/2020   Acute on chronic diastolic HF (heart failure) (HCC)    Acute hypoxemic respiratory failure ([place]) 01/24/2020   Obesity, Class III, BMI 40-49.9 (morbid obesity) ([place]) 05/05/2019   COPD exacerbation ([place]) 05/04/2019   Hypoxia    Leg edema    COPD with acute exacerbation (HCC) 09/16/2018   CKD (chronic kidney disease), stage III ([place]) 09/16/2018   Acute  respiratory failure with hypoxia ([place]) 09/16/2018   Allergic rhinitis 08/26/2018   ARF (acute renal failure) ([place]) 10/17/2015   Hypertension    Asthmatic bronchitis , chronic (HCC)    GERD (gastroesophageal reflux disease)    Arthritis    Kidney stones    Prostate cancer ([place])    Thyroid nodule    OSA (obstructive sleep apnea)    Medial meniscus tear    Lateral meniscus tear    Sleep apnea 11/29/2009    HYPERSOMNIA 09/26/2009   DYSPNEA 09/26/2009   PROSTATE CANCER, HX OF 09/26/2009   Essential hypertension 09/25/2009    History obtained from: chart review and patient.  Thomas [name] is a 61 y.o. male presenting for a follow up visit. He was last seen in February 2023 by Althea Charon, one of our esteemed nurse practitioners.  At that time, he was continued on Breztri 2 puffs twice daily as well as Singulair 10 mg daily.  For his rhinitis, he was continued on Singulair.  All of his antihistamines were stopped because he was having side effects to them.  Allergy shots were discussed.  For his rash, a diary was recommended.  He was started on prednisone to help get this under control.  For possible alpha gal, avoidance of red meat was recommended.  Lab work was obtained. Alpha gal was very positive with an IgE of 63.    Asthma/Respiratory Symptom History: He is getting his [place] from the [place]. He is getting a different name of a medication but it is the same medications completely. This is $115  for one month. He was previously on Trelegy and Dr. [name] had recommended that he go to [facility]. He has tried the copay card. Overall, Thomas [name] feels that he cannot tell whether the Judithann Sauger is any better than the Trelegy. He was off of the [place] for a short period of time due to shipping issues and he definitely got worse. He remains interested in   Allergic Rhinitis Symptom History: He remains on the montelukast as well as the cetirizine. He does not use a nasal steroid at all. His breathing is certainly more of an issue compared to his allergic rhinitis. He has not been on antibiotics at all since the last visit.   [employer] is transitioning to another one at the end of this month. He thinks that he is changing to [employer]. He tells me that the biologic was going to be prohibitively expensive with the [employer]. I called and discussed with Tammy, our Biologics Coordinator, and she said that it  would be better to just wait until he got his new insurance plan next month; he is fine with this plan.  Food Allergy Symptom History: He is avoiding all red meats. He does have an EpiPen in place.  There have been no accidental exposures at all since the last visit.     Otherwise, there have been no changes to his past medical history, surgical history, family history, or social history.    Review of Systems  Constitutional:  Negative for chills and fever.  HENT:         Denies rhinorrhea, nasal congestion, and postnasal drip since stopping both Zyrtec and Allegra  Respiratory:  Positive for cough, shortness of breath and wheezing.        Reports cough that can be dry at times and productive at times.  When it is productive it can be clear to dark gray.  He also reports wheezing  and shortness of breath.  Denies tightness in his chest and nocturnal awakenings due to breathing problems.  Cardiovascular:  Negative for chest pain and palpitations.  Gastrointestinal:        Reports reflux from time to time and has a prescription for Pepcid to use as needed  Genitourinary:  Negative for frequency.  Skin:  Positive for itching and rash.       Reports itchy rash that started a couple weeks after being skin tested for environmental allergens  Neurological:  Negative for headaches.  Endo/Heme/Allergies:  Positive for environmental allergies.       Objective:   Blood pressure (!) 142/100, pulse 88, temperature 99.5 F (37.5 C), temperature source Temporal, resp. rate 16, height 5' 8.47" (1.739 m), weight 299 lb (135.6 kg), SpO2 94 %. Body mass index is 44.85 kg/m.    Physical Exam Vitals reviewed.  Constitutional:      Appearance: He is well-developed.  HENT:     Head: Normocephalic and atraumatic.     Right Ear: Tympanic membrane, ear canal and external ear normal. No drainage, swelling or tenderness. Tympanic membrane is not injected, scarred, erythematous, retracted or bulging.      Left Ear: Tympanic membrane, ear canal and external ear normal. No drainage, swelling or tenderness. Tympanic membrane is not injected, scarred, erythematous, retracted or bulging.     Nose: No nasal deformity, septal deviation, mucosal edema or rhinorrhea.     Right Turbinates: Enlarged, swollen and pale.     Left Turbinates: Enlarged, swollen and pale.     Right Sinus: No maxillary sinus tenderness or frontal sinus tenderness.     Left Sinus: No maxillary sinus tenderness or frontal sinus tenderness.     Comments: No nasal polyps noted.     Mouth/Throat:     Lips: Pink.     Mouth: Mucous membranes are moist. Mucous membranes are not pale and not dry.     Pharynx: Uvula midline.  Eyes:     General: Lids are normal. Allergic shiner present.        Right eye: No discharge.        Left eye: No discharge.     Conjunctiva/sclera: Conjunctivae normal.     Right eye: Right conjunctiva is not injected. No chemosis.    Left eye: Left conjunctiva is not injected. No chemosis.    Pupils: Pupils are equal, round, and reactive to light.  Cardiovascular:     Rate and Rhythm: Normal rate and regular rhythm.     Heart sounds: Normal heart sounds.  Pulmonary:     Effort: Pulmonary effort is normal. No tachypnea, accessory muscle usage or respiratory distress.     Breath sounds: Normal breath sounds. No wheezing, rhonchi or rales.     Comments: Moving air well in all lung fields.  Chest:     Chest wall: No tenderness.  Abdominal:     Tenderness: There is no abdominal tenderness. There is no guarding or rebound.  Lymphadenopathy:     Head:     Right side of head: No submandibular, tonsillar or occipital adenopathy.     Left side of head: No submandibular, tonsillar or occipital adenopathy.     Cervical: No cervical adenopathy.  Skin:    General: Skin is warm.     Capillary Refill: Capillary refill takes less than 2 seconds.     Coloration: Skin is not pale.     Findings: No abrasion, erythema,  petechiae or rash. Rash is  not papular, urticarial or vesicular.  Neurological:     Mental Status: He is alert.  Psychiatric:        Behavior: Behavior is cooperative.      Diagnostic studies:    Spirometry: results abnormal (FEV1: 2.19/66%, FVC: 3.25/76%, FEV1/FVC: 67%).    Spirometry consistent with possible restrictive disease.   Allergy Studies: none        Salvatore Marvel, MD  [facility] [place] [facility] [place]

## 2021-10-02 NOTE — Patient Instructions (Addendum)
1. Asthma-COPD overlap syndrome - Lung testing looked much better today!  - I talked to [facility] and she said that she would wait until next month and we will resubmit for Nucala (this is their preferred first line treatment).  - I am going to get another complete blood count so we have all of our ducks in a row when the time comes. - Information on Nucala provided today.  - Daily controller medication(s): Breztri two puffs twice with spacer and Singulair (montelukast) '10mg'$  daily (sample provided to help save some moolah). - Prior to physical activity: albuterol 2 puffs 10-15 minutes before physical activity. - Rescue medications: albuterol 4 puffs every 4-6 hours as needed - Asthma control goals:  * Full participation in all desired activities (may need albuterol before activity) * Albuterol use two time or less a week on average (not counting use with activity) * Cough interfering with sleep two time or less a month * Oral steroids no more than once a year * No hospitalizations  2. Seasonal and perennial allergic rhinitis - Previous testing showed: grasses, ragweed, weeds, trees, indoor molds, outdoor molds, dust mites, cat, dog, and cockroach. - Continue with: Singulair (montelukast) '10mg'$  daily - Continue with: Zyrtec (cetirizine) '10mg'$  tablet once daily - You can use an extra dose of the antihistamine, if needed, for breakthrough symptoms.  - Consider nasal saline rinses 1-2 times daily to remove allergens from the nasal cavities as well as help with mucous clearance (this is especially helpful to do before the nasal sprays are given) - Consider allergy shots as a means of long-term control. - Allergy shots "re-train" and "reset" the immune system to ignore environmental allergens and decrease the resulting immune response to those allergens (sneezing, itchy watery eyes, runny nose, nasal congestion, etc).    - Allergy shots improve symptoms in 75-85% of patients.  - We can discuss more at  the next appointment if the medications are not working for you. - Allergy shots can help with asthma as well as allergies, so this might be a good option for you.  3. Return in about 4 months (around 02/01/2022).    Please inform us of any Emergency Department visits, hospitalizations, or changes in symptoms. Call us before going to the ED for breathing or allergy symptoms since we might be able to fit you in for a sick visit. Feel free to contact us anytime with any questions, problems, or concerns.  It was a pleasure to meet you today!  Websites that have reliable patient information: 1. American Academy of Asthma, Allergy, and Immunology: www.aaaai.org 2. Food Allergy Research and Education (FARE): foodallergy.org 3. Mothers of Asthmatics: http://www.asthmacommunitynetwork.org 4. American College of Allergy, Asthma, and Immunology: www.acaai.org   COVID-19 Vaccine Information can be found at: ShippingScam.co.uk For questions related to vaccine distribution or appointments, please email [email or web address] or call [phone].   We realize that you might be concerned about having an allergic reaction to the COVID19 vaccines. To help with that concern, WE ARE OFFERING THE COVID19 VACCINES IN OUR OFFICE! Ask the front desk for dates!     "Like" [place] on Facebook and Instagram for our latest updates!      A healthy democracy works best when [employer] participate! Make sure you are registered to vote! If you have moved or changed any of your contact information, you will need to get this updated before voting!  In some cases, you MAY be able to register to vote online: [email or web address]

## 2021-10-03 LAB — CBC WITH DIFFERENTIAL
Basophils Absolute: 0.1 10*3/uL (ref 0.0–0.2)
Basos: 1 %
EOS (ABSOLUTE): 0.9 10*3/uL — ABNORMAL HIGH (ref 0.0–0.4)
Eos: 10 %
Hematocrit: 45.8 % (ref 37.5–51.0)
Hemoglobin: 15.6 g/dL (ref 13.0–17.7)
Immature Grans (Abs): 0.1 10*3/uL (ref 0.0–0.1)
Immature Granulocytes: 1 %
Lymphocytes Absolute: 1.9 10*3/uL (ref 0.7–3.1)
Lymphs: 21 %
MCH: 30.2 pg (ref 26.6–33.0)
MCHC: 34.1 g/dL (ref 31.5–35.7)
MCV: 89 fL (ref 79–97)
Monocytes Absolute: 0.9 10*3/uL (ref 0.1–0.9)
Monocytes: 9 %
Neutrophils Absolute: 5.4 10*3/uL (ref 1.4–7.0)
Neutrophils: 58 %
RBC: 5.17 x10E6/uL (ref 4.14–5.80)
RDW: 13.5 % (ref 11.6–15.4)
WBC: 9.2 10*3/uL (ref 3.4–10.8)

## 2021-10-04 ENCOUNTER — Encounter: Payer: Self-pay | Admitting: Allergy & Immunology

## 2021-10-08 ENCOUNTER — Telehealth: Payer: Self-pay | Admitting: *Deleted

## 2021-10-08 NOTE — Telephone Encounter (Signed)
-----   Message from Valentina Shaggy, MD sent at 10/04/2021  5:12 AM EDT ----- AEC 900 - is that enough? :-p

## 2021-10-09 NOTE — Telephone Encounter (Signed)
Awesome sauce - thanks Tam Tam!   Salvatore Marvel, MD [facility] [place] [facility]

## 2021-10-09 NOTE — Telephone Encounter (Signed)
L/m for patient to reach out to me. He  is going to have Ins change at end of month so we will have to wait until then to get Ins approval for Nucala. In the meantime Dr [name] requested patient start samples so I had sent one over to the clinic today to get patient started

## 2021-10-09 NOTE — Telephone Encounter (Signed)
Spoke to patient and made appt in [place] to start [place] 8/18 and advised to get me Ins info as soon as available so I can get approval for same

## 2021-10-18 ENCOUNTER — Ambulatory Visit (INDEPENDENT_AMBULATORY_CARE_PROVIDER_SITE_OTHER): Payer: 59 | Admitting: *Deleted

## 2021-10-18 DIAGNOSIS — J4540 Moderate persistent asthma, uncomplicated: Secondary | ICD-10-CM

## 2021-10-18 MED ORDER — MEPOLIZUMAB 100 MG ~~LOC~~ SOLR
100.0000 mg | SUBCUTANEOUS | Status: AC
  Administered 2021-10-18 – 2023-02-20 (×6): 100 mg via SUBCUTANEOUS

## 2021-10-18 NOTE — Progress Notes (Signed)
Immunotherapy   Patient Details  Name: Thomas [name] MRN: [id number] Date of Birth: 10-21-1960  10/18/2021  Thomas [name] started injections for  Nucala  Frequency: Every 4 Weeks  Epi-Pen: Not Required  Consent signed and patient instructions given. Patient started Nucala today and received 22m in the [facility]. Patient waited 30 minutes in office and did not experience any issues.    Thomas [name] 10/18/2021, 2:21 PM

## 2021-11-15 ENCOUNTER — Ambulatory Visit (INDEPENDENT_AMBULATORY_CARE_PROVIDER_SITE_OTHER): Payer: PPO

## 2021-11-15 DIAGNOSIS — J4540 Moderate persistent asthma, uncomplicated: Secondary | ICD-10-CM | POA: Diagnosis not present

## 2021-11-22 DIAGNOSIS — Z79899 Other long term (current) drug therapy: Secondary | ICD-10-CM | POA: Diagnosis not present

## 2021-11-22 DIAGNOSIS — N1831 Chronic kidney disease, stage 3a: Secondary | ICD-10-CM | POA: Diagnosis not present

## 2021-11-22 DIAGNOSIS — Z8546 Personal history of malignant neoplasm of prostate: Secondary | ICD-10-CM | POA: Diagnosis not present

## 2021-11-22 DIAGNOSIS — I10 Essential (primary) hypertension: Secondary | ICD-10-CM | POA: Diagnosis not present

## 2021-11-22 DIAGNOSIS — M109 Gout, unspecified: Secondary | ICD-10-CM | POA: Diagnosis not present

## 2021-11-28 DIAGNOSIS — I10 Essential (primary) hypertension: Secondary | ICD-10-CM | POA: Diagnosis not present

## 2021-11-28 DIAGNOSIS — M1009 Idiopathic gout, multiple sites: Secondary | ICD-10-CM | POA: Diagnosis not present

## 2021-11-28 DIAGNOSIS — N1831 Chronic kidney disease, stage 3a: Secondary | ICD-10-CM | POA: Diagnosis not present

## 2021-12-09 ENCOUNTER — Telehealth: Payer: Self-pay | Admitting: *Deleted

## 2021-12-09 ENCOUNTER — Other Ambulatory Visit (HOSPITAL_COMMUNITY): Payer: Self-pay

## 2021-12-09 MED ORDER — NUCALA 100 MG/ML ~~LOC~~ SOAJ
100.0000 mg | SUBCUTANEOUS | 11 refills | Status: DC
  Filled 2021-12-09: qty 1, 28d supply, fill #0

## 2021-12-09 NOTE — Telephone Encounter (Signed)
Called patient and advised approval and submit for Nucala autoinjector to [place] and they will let me know if too high copay or LIS andif not LIS will need to apply for PAP

## 2021-12-10 ENCOUNTER — Other Ambulatory Visit (HOSPITAL_COMMUNITY): Payer: Self-pay

## 2021-12-12 ENCOUNTER — Telehealth: Payer: Self-pay | Admitting: *Deleted

## 2021-12-12 NOTE — Telephone Encounter (Signed)
Patient copay over 1000 so will need to go thru PAP and he is coming in for sample tomorrow and will sign PAP app

## 2021-12-13 ENCOUNTER — Ambulatory Visit (INDEPENDENT_AMBULATORY_CARE_PROVIDER_SITE_OTHER): Payer: PPO

## 2021-12-13 DIAGNOSIS — J4540 Moderate persistent asthma, uncomplicated: Secondary | ICD-10-CM | POA: Diagnosis not present

## 2021-12-25 ENCOUNTER — Encounter: Payer: Self-pay | Admitting: Internal Medicine

## 2021-12-25 ENCOUNTER — Ambulatory Visit: Payer: PPO | Admitting: Internal Medicine

## 2021-12-25 DIAGNOSIS — J4489 Other specified chronic obstructive pulmonary disease: Secondary | ICD-10-CM | POA: Diagnosis not present

## 2021-12-25 MED ORDER — ALBUTEROL SULFATE HFA 108 (90 BASE) MCG/ACT IN AERS: 2.0000 | INHALATION_SPRAY | RESPIRATORY_TRACT | Status: DC | PRN

## 2021-12-25 NOTE — Patient Instructions (Signed)
No change in our medications   GERD (REFLUX)  is an extremely common cause of respiratory symptoms just like yours , many times with no obvious heartburn at all.    It can be treated with medication, but also with lifestyle changes including elevation of the head of your bed (ideally with 6 -8inch blocks under the headboard of your bed),  Smoking cessation, avoidance of late meals, excessive alcohol, and avoid fatty foods, chocolate, peppermint, colas, red wine, and acidic juices such as orange juice.  NO MINT OR MENTHOL PRODUCTS SO NO COUGH DROPS  USE SUGARLESS CANDY INSTEAD (Jolley ranchers or Stover's or Life Savers) or even ice chips will also do - the key is to swallow to prevent all throat clearing. NO OIL BASED VITAMINS - use powdered substitutes.  Avoid fish oil when coughing.   If not improving >  Try prilosec otc '20mg'$   Take 30-60 min before first meal of the day and Pepcid ac (famotidine) 20 mg one @  bedtime until cough is completely gone for at least a week without the need for cough suppression   Also  Ok to try albuterol 15 min before an activity (on alternating days)  that you know would usually make you short of breath and see if it makes any difference and if makes none then don't take albuterol after activity unless you can't catch your breath as this means it's the resting that helps, not the albuterol.   Return after one year of Nucala if respiratory problems have not improved - see you sooner if getting worse

## 2021-12-25 NOTE — Progress Notes (Signed)
Thomas [name], male    DOB: 1961-01-23,     MRN: [id number]   Brief patient profile:  61  yowm quit smoking 1995 at wt  215  former pt of [facility] with AB with no copd by pfts 10/2018 and working dx of AB.      History of Present Illness  07/31/2020  Pulmonary/ 1st office eval/ [name] / [name] [facility]  Chief Complaint  Patient presents with   Follow-up    Former pt of Dr Delton Coombes. He states that his breathing has been progressively worse since the last visit in 2020. He states he has been on pred and doxy off and on and these help but only temporarily. He is currently on 40 mg pred daily. He has some prod cough with white to brown sputum and also wheezing. He is using his albuterol inhaler 2 x per wk and has duoneb he uses 3-4 x per day.   Dyspnea:  On good days can across the parking lot/  [employer] shopping / incline to MB s stopping while on trelegy/ and daily neb albuterol worse than usual since March 2021 with flares every few weeks unless stays on prednisone  Cough: esp in am dark yellow assoc with intermittent nasal discharge / poor dentition  Sleep: bed is flat right side down SABA use: neb  No 02  Rec For cough/ congestion > mucineX 1200 mg every 12 hours Plan A = Automatic = Always=    Breztri Take 2 puffs first thing in am and then another 2 puffs about 12 hours later.  Work on inhaler technique:  Finish up your prednisone  Plan B = Backup (to supplement plan A, not to replace it) Only use your albuterol inhaler as a rescue medication  Plan C = Crisis (instead of Plan B but only if Plan B stops working) - only use your albuterol nebulizer if you first try Plan B and it fails to help > ok to use the nebulizer up to every 4 hours but if start needing it regularly call for immediate appointment GERD diet  Stop trelegy We will call to schedule a sinus CT   1. Moderate inferior right maxillary sinus mucosal thickening, which is indeterminate but potential odontogenic given  adjacent carious right maxillary molar. Ossific fragment in the posterior/inferior right maxillary sinus may represent a tooth fragment. 2. Otherwise, mild paranasal sinus mucosal thickening with air-fluid level in the left maxillary sinus. 3. Evidence of prior endoscopic sinus surgery with right maxillary antrostomy and right ethmoidectomies  >  ENT/dental eval     08/22/2020  f/u ov/[name] office/Thomas [name] re: worse sob/cough x 08/19/20 flare [name] eval pending for ongong sinusitis  Chief Complaint  Patient presents with   Follow-up    Cough with yellow sputum past 2-3 days. He is also more SOB and has been wheezing. He feels like leaning forward helps him to breathe better. He has had some swelling in his feet. He has used his rescue inhaler once since these symptoms started.    Dyspnea:  room to room Cough: congested yellow mucus x3 days  Sleeping: bed is flat with 1 one pillow rotated on side or prone  SABA use: none today  02: none  Covid status: x 2  Rec Plan A = Automatic = Always=    Breztri Take 2 puffs first thing in am and then another 2 puffs about 12 hours later.   Work on inhaler technique:   Plan  B = Backup (to supplement plan A, not to replace it) Only use your albuterol inhaler as a rescue medication  Plan C = Crisis (instead of Plan B but only if Plan B stops working) - only use your albuterol nebulizer if you first try Plan B and it fails to help  For cough > mucinex dm 1200 mg every 12 hours and use flutter valve as much as possible  Late add  : augmentin x 14 more days and keep teoh/ dental appts    09/20/20 [name] eval f/u rec flonase         06/19/2021  f/u ov/[name] office/Thomas [name] re: AB maint on canadian breztri  / no better   Chief Complaint  Patient presents with   Follow-up    Cough  has improved sob has worsened since last ov   Dyspnea:  still doing [employer] but struggling  Cough: congested rattling min mucoid Sleeping: bed is flat, sleeping  on  side or prone SABA use: no better p sab  02: none  Covid status: vax x all of them  Rec When cough flares adding every day >>> prilosec otc 20mg   Take 30-60 min before first meal of the day and Pepcid ac (famotidine) 20 mg one @  supper x one month to see if helps breathing/coughing/ wheezing   GERD diet  You will need to reconsider starting biologics for asthma but they are expensive and should be thru Dr Dellis Anes.   Nucala rx 10/18/21  per Dellis Anes   12/25/2021  f/u ov/[name] office/Thomas [name] re: AB maint on breztri 2bid  / singulair/ nucala Chief Complaint  Patient presents with   Follow-up    Patient feels better since last ov. 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 [employer]  Cough: a little better with am congestion / shower easier  Sleeping: bed is flat / one pillow  SABA use: only uses p activity  02: none    No obvious day to day or daytime variability or assoc excess/ purulent sputum or mucus plugs or hemoptysis or cp or chest tightness, subjective wheeze or overt sinus or hb symptoms.   Sleeping  without nocturnal  or early am exacerbation  of respiratory  c/o's or need for noct saba. Also denies any obvious fluctuation of symptoms with weather or environmental changes or other aggravating or alleviating factors except as outlined above   No unusual exposure hx or h/o childhood pna/ asthma or knowledge of premature birth.  Current Allergies, Complete Past Medical History, Past Surgical History, Family History, and Social History were reviewed in [employer] record.  ROS  The following are not active complaints unless bolded Hoarseness, sore throat, dysphagia, dental problems, itching, sneezing,  nasal congestion or discharge of excess mucus or purulent secretions, ear ache,   fever, chills, sweats, unintended wt loss or wt gain, classically pleuritic or exertional cp,  orthopnea pnd or arm/hand swelling  or leg swelling, presyncope,  palpitations, abdominal pain, anorexia, nausea, vomiting, diarrhea  or change in bowel habits or change in bladder habits, change in stools or change in urine, dysuria, hematuria,  rash, arthralgias, visual complaints, headache, numbness, weakness or ataxia or problems with walking or coordination,  change in mood or  memory.        Current Meds  Medication Sig   Albuterol Sulfate (PROAIR RESPICLICK) 108 (90 Base) MCG/ACT AEPB Inhale 2 puffs into the lungs every 6 (six) hours as needed (  sob, wheeze).   allopurinol (ZYLOPRIM) 300 MG tablet Take 150 mg by mouth daily.   Budeson-Glycopyrrol-Formoterol (BREZTRI AEROSPHERE) 160-9-4.8 MCG/ACT AERO Take 2 puffs first thing in am and then another 2 puffs about 12 hours later.   metoprolol tartrate (LOPRESSOR) 25 MG tablet Take 1 tablet (25 mg total) by mouth 2 (two) times daily.   montelukast (SINGULAIR) 10 MG tablet Take 10 mg by mouth at bedtime.   Spacer/Aero-Holding Chambers DEVI 1 Device by Does not apply route as directed.   torsemide (DEMADEX) 20 MG tablet Take 20 mg by mouth daily.   Current Facility-Administered Medications for the 12/25/21 encounter (Office Visit) with Nyoka Cowden, MD  Medication   mepolizumab (NUCALA) injection 100 mg                 Past Medical History:  Diagnosis Date   Arthritis    "touch in my neck" (2012-09-11)   COPD (chronic obstructive pulmonary disease) (HCC)    GERD (gastroesophageal reflux disease)    Hypertension    Kidney stones    "passed on their own" (2012/09/11)   Lateral meniscus tear    Medial meniscus tear    Obesity, Class III, BMI 40-49.9 (morbid obesity) (HCC) 05/05/2019   OSA (obstructive sleep apnea)    "cannot sleep w/mask" (09/11/12)   Prostate cancer (HCC)    Shortness of breath    "can happen at anytime" (09/11/2012)   Thyroid nodule    bilaterally/notes 2012-09-11        Objective:     Wts  12/25/2021      301   06/19/2021        301  12/11/2020      291     10/10/2020        286  08/22/2020        286   07/31/20 293 lb (132.9 kg)  01/27/20 290 lb 9 oz (131.8 kg)  05/04/19 288 lb 9.3 oz (130.9 kg)    Vital signs reviewed  12/25/2021  - Note at rest 02 sats  96% on RA   General appearance:    obese amb wm nad     HEENT : Oropharynx  clear     Nasal turbinates nl    NECK :  without  apparent JVD/ palpable Nodes/TM    LUNGS: no acc muscle use,  Nl contour chest which is clear to A and P bilaterally without cough on insp or exp maneuvers   CV:  RRR  no s3 or murmur or increase in P2, and no edema   ABD:  obese soft and nontender with nl inspiratory excursion in the supine position. No bruits or organomegaly appreciated   MS:  Nl gait/ ext warm without deformities Or obvious joint restrictions  calf tenderness, cyanosis or clubbing    SKIN: warm and dry without lesions    NEURO:  alert, approp, nl sensorium with  no motor or cerebellar deficits apparent.                Assessment

## 2021-12-26 ENCOUNTER — Encounter: Payer: Self-pay | Admitting: Internal Medicine

## 2021-12-26 NOTE — Assessment & Plan Note (Signed)
Quit smoking 1995 at wt 215  - pfts 10/27/2018 nl including f/v loop x for EV 48% at wt = 277  - 07/31/2020  After extensive coaching inhaler device,  effectiveness =    80% so try change from trelegy to breztri 2bid  - CT sinus 08/09/20 pos > [name] eval (see chronic sinusitis  - flare x 08/14/20 rx augmentin x 21 day and pred x 6 days   - Allergy profile 10/10/2020 >  Eos 1.5  /  IgE  296> allergy referral 12/11/2020  - 06/19/2021 referred back to Dr [name] to consider biologics > nucala Oct 18 2021 - 12/25/2021  After extensive coaching inhaler device,  effectiveness =    90%    could probably drop back to symbicort 90 2bid if nucala effective as he does not have significant copd

## 2021-12-26 NOTE — Assessment & Plan Note (Addendum)
ERV 48% at wt 277   12/25/2021   Walked on RA  x  2  lap(s) =  approx 300  ft  @ mod pace, stopped due to sob  with lowest 02 sats 93%   Body mass index is 44.54 kg/m.  -  Trending slowly up  No results found for: "TSH"    Contributing to doe and risk of GERD >>>   reviewed the need and the process to achieve and maintain neg calorie balance > defer f/u primary care including intermittently monitoring thyroid status      F/u here can be yearly or all refills per allergy/ whichever he prefers  Each maintenance medication was reviewed in detail including emphasizing most importantly the difference between maintenance and prns and under what circumstances the prns are to be triggered using an action plan format where appropriate.  Total time for H and P, chart review, counseling, reviewing hfa device(s) , directly observing portions of ambulatory 02 saturation study/ and generating customized AVS unique to this office visit / same day charting = 32 min summary f/u

## 2022-01-13 ENCOUNTER — Ambulatory Visit: Payer: PPO

## 2022-01-17 ENCOUNTER — Ambulatory Visit (INDEPENDENT_AMBULATORY_CARE_PROVIDER_SITE_OTHER): Payer: PPO | Admitting: *Deleted

## 2022-01-17 DIAGNOSIS — J4540 Moderate persistent asthma, uncomplicated: Secondary | ICD-10-CM

## 2022-02-12 ENCOUNTER — Encounter: Payer: Self-pay | Admitting: Allergy & Immunology

## 2022-02-12 ENCOUNTER — Ambulatory Visit (INDEPENDENT_AMBULATORY_CARE_PROVIDER_SITE_OTHER): Payer: PPO | Admitting: Allergy & Immunology

## 2022-02-12 VITALS — BP 144/96 | HR 85 | Temp 97.9°F | Resp 20

## 2022-02-12 DIAGNOSIS — Z91018 Allergy to other foods: Secondary | ICD-10-CM

## 2022-02-12 DIAGNOSIS — J3089 Other allergic rhinitis: Secondary | ICD-10-CM

## 2022-02-12 DIAGNOSIS — J4489 Other specified chronic obstructive pulmonary disease: Secondary | ICD-10-CM

## 2022-02-12 DIAGNOSIS — J302 Other seasonal allergic rhinitis: Secondary | ICD-10-CM

## 2022-02-12 NOTE — Progress Notes (Signed)
FOLLOW UP  Date of Service/Encounter:  02/12/22   Assessment:   Asthma-COPD overlap syndrome - with some improvement with Nucala, but changing to Dupixent to see if there is more improvement   Physically deconditioned - likely contributing to his SOB   History of smoking - but stopped   Seasonal and perennial allergic rhinitis (grasses, ragweed, weeds, trees, indoor molds, outdoor molds, dust mites, cat, dog, and cockroach)   Elevated absolute eosinophil count - likely secondary to uncontrolled atopic disease, but could consider hypereosinophilic work-up in the future if indicated  Plan/Recommendations:   1. Asthma-COPD overlap syndrome - Lung testing not done today since our breathing machine was not working.  - We are going to get you on Dupixent instead of Nucala.  - Daily controller medication(s): Breztri two puffs twice with spacer and Singulair (montelukast) '10mg'$  daily (sample provided to help save some moolah). - Prior to physical activity: albuterol 2 puffs 10-15 minutes before physical activity. - Rescue medications: albuterol 4 puffs every 4-6 hours as needed - Asthma control goals:  * Full participation in all desired activities (may need albuterol before activity) * Albuterol use two time or less a week on average (not counting use with activity) * Cough interfering with sleep two time or less a month * Oral steroids no more than once a year * No hospitalizations  2. Seasonal and perennial allergic rhinitis - Previous testing showed: grasses, ragweed, weeds, trees, indoor molds, outdoor molds, dust mites, cat, dog, and cockroach  - Continue with: Singulair (montelukast) '10mg'$  daily - START TAKING: Zyrtec (cetirizine) '10mg'$  tablet once daily - You can use an extra dose of the antihistamine, if needed, for breakthrough symptoms.  - Consider nasal saline rinses 1-2 times daily to remove allergens from the nasal cavities as well as help with mucous clearance (this is  especially helpful to do before the nasal sprays are given) - Consider allergy shots as a means of long-term control. - Allergy shots "re-train" and "reset" the immune system to ignore environmental allergens and decrease the resulting immune response to those allergens (sneezing, itchy watery eyes, runny nose, nasal congestion, etc).    - Allergy shots improve symptoms in 75-85% of patients.  - We can discuss more at the next appointment if the medications are not working for you. - Allergy shots can help with asthma as well as allergies, so this might be a good option for you. - CPT codes provided for allergy shots.   3. Return in about 3 months (around 05/14/2022).   Subjective:   Thomas [name] is a 61 y.o. male presenting today for follow up of  Chief Complaint  Patient presents with   Asthma    Struggling with breathing     Thomas [name] has a history of the following: Patient Active Problem List   Diagnosis Date Noted   Chronic sinusitis 12/12/2020   Acute on chronic diastolic HF (heart failure) ([place])    Acute hypoxemic respiratory failure ([place]) 01/24/2020   Obesity, Class III, BMI 40-49.9 (morbid obesity) ([place]) 05/05/2019   COPD exacerbation ([place]) 05/04/2019   Hypoxia    Leg edema    COPD with acute exacerbation ([place]) 09/16/2018   CKD (chronic kidney disease), stage III ([place]) 09/16/2018   Acute respiratory failure with hypoxia (HCC) 09/16/2018   Allergic rhinitis 08/26/2018   ARF (acute renal failure) ([place]) 10/17/2015   Hypertension    Asthmatic bronchitis , chronic    GERD (gastroesophageal reflux disease)  Arthritis    Kidney stones    Prostate cancer [facility])    Thyroid nodule    OSA (obstructive sleep apnea)    Medial meniscus tear    Lateral meniscus tear    Sleep apnea 11/29/2009   HYPERSOMNIA 09/26/2009   DYSPNEA 09/26/2009   PROSTATE CANCER, HX OF 09/26/2009   Essential hypertension 09/25/2009    History obtained from: chart review and  patient.  Thomas [name] is a 61 y.o. male presenting for a follow up visit.  We last saw him in August 2023.  At that time, his lung testing looked much better.  We submitted for initiation of Nucala.  We continue with Breztri 2 puffs twice daily and Singulair 10 mg daily.  For his rhinitis, he would continue with Singulair and Zyrtec. We talked about allergy shots for long-term control.  He continued to avoid red meats completely.  His last testing was done in February 2023.  Asthma/Respiratory Symptom History: He reports that Nucala allows him to take a warmer shower and he does not cough as much as he used to. He remains on the Breztri two puffs twice daily. He has not been using the rescue inhaler at all. He did try to premedicate with albuterol prior to physical activity and this just caused some light headedness.  Patient tells me that he was diagnosed with "asthmatic bronchitis". Prednisone helps "stop the flare up", but it does not make him feel superhuman and amazing.  Allergic Rhinitis Symptom History: He remains on the montelukast daily. He has never been on a daily antihistamine.  He has never been on allergy shots at all. He has not required any antibiotics at all for any sinus infections. She is open to starting allergy shots.   Otherwise, there have been no changes to his past medical history, surgical history, family history, or social history.    Review of Systems  Constitutional:  Negative for chills and fever.  HENT:         Denies rhinorrhea, nasal congestion, and postnasal drip since stopping both Zyrtec and Allegra  Respiratory:  Positive for shortness of breath and wheezing. Negative for cough.        Reports cough that can be dry at times and productive at times.  When it is productive it can be clear to dark gray.  He also reports wheezing and shortness of breath.  Denies tightness in his chest and nocturnal awakenings due to breathing problems.  Cardiovascular:  Negative for  chest pain and palpitations.  Gastrointestinal:        Reports reflux from time to time and has a prescription for Pepcid to use as needed  Genitourinary:  Negative for frequency.  Skin:  Negative for itching and rash.       Reports itchy rash that started a couple weeks after being skin tested for environmental allergens  Neurological:  Negative for headaches.  Endo/Heme/Allergies:  Positive for environmental allergies.       Objective:   Blood pressure (!) 144/96, pulse 85, temperature 97.9 F (36.6 C), resp. rate 20, SpO2 96 %. There is no height or weight on file to calculate BMI.    Physical Exam Vitals reviewed.  Constitutional:      Appearance: He is well-developed.  HENT:     Head: Normocephalic and atraumatic.     Right Ear: Tympanic membrane, ear canal and external ear normal. No drainage, swelling or tenderness. Tympanic membrane is not injected, scarred, erythematous, retracted or bulging.  Left Ear: Tympanic membrane, ear canal and external ear normal. No drainage, swelling or tenderness. Tympanic membrane is not injected, scarred, erythematous, retracted or bulging.     Nose: No nasal deformity, septal deviation, mucosal edema or rhinorrhea.     Right Turbinates: Enlarged, swollen and pale.     Left Turbinates: Enlarged, swollen and pale.     Right Sinus: No maxillary sinus tenderness or frontal sinus tenderness.     Left Sinus: No maxillary sinus tenderness or frontal sinus tenderness.     Comments: No nasal polyps noted.     Mouth/Throat:     Lips: Pink.     Mouth: Mucous membranes are moist. Mucous membranes are not pale and not dry.     Pharynx: Uvula midline.     Comments: Cobblestoning in the posterior oropharynx.  Eyes:     General: Lids are normal. Allergic shiner present.        Right eye: No discharge.        Left eye: No discharge.     Conjunctiva/sclera: Conjunctivae normal.     Right eye: Right conjunctiva is not injected. No chemosis.     Left eye: Left conjunctiva is not injected. No chemosis.    Pupils: Pupils are equal, round, and reactive to light.  Cardiovascular:     Rate and Rhythm: Normal rate and regular rhythm.     Heart sounds: Normal heart sounds.  Pulmonary:     Effort: Pulmonary effort is normal. No tachypnea, accessory muscle usage or respiratory distress.     Breath sounds: Normal breath sounds. No wheezing, rhonchi or rales.     Comments: Moving air well in all lung fields.  Chest:     Chest wall: No tenderness.  Abdominal:     Tenderness: There is no abdominal tenderness. There is no guarding or rebound.  Lymphadenopathy:     Head:     Right side of head: No submandibular, tonsillar or occipital adenopathy.     Left side of head: No submandibular, tonsillar or occipital adenopathy.     Cervical: No cervical adenopathy.  Skin:    General: Skin is warm.     Capillary Refill: Capillary refill takes less than 2 seconds.     Coloration: Skin is not pale.     Findings: No abrasion, erythema, petechiae or rash. Rash is not papular, urticarial or vesicular.  Neurological:     Mental Status: He is alert.  Psychiatric:        Behavior: Behavior is cooperative.      Diagnostic studies: none      Salvatore Marvel, MD  [facility] [place]

## 2022-02-12 NOTE — Patient Instructions (Addendum)
1. Asthma-COPD overlap syndrome - Lung testing not done today since our breathing machine was not working.  - We are going to get you on Dupixent instead of Nucala.  - Daily controller medication(s): Breztri two puffs twice with spacer and Singulair (montelukast) '10mg'$  daily (sample provided to help save some moolah). - Prior to physical activity: albuterol 2 puffs 10-15 minutes before physical activity. - Rescue medications: albuterol 4 puffs every 4-6 hours as needed - Asthma control goals:  * Full participation in all desired activities (may need albuterol before activity) * Albuterol use two time or less a week on average (not counting use with activity) * Cough interfering with sleep two time or less a month * Oral steroids no more than once a year * No hospitalizations  2. Seasonal and perennial allergic rhinitis - Previous testing showed: grasses, ragweed, weeds, trees, indoor molds, outdoor molds, dust mites, cat, dog, and cockroach  - Continue with: Singulair (montelukast) '10mg'$  daily - START TAKING: Zyrtec (cetirizine) '10mg'$  tablet once daily - You can use an extra dose of the antihistamine, if needed, for breakthrough symptoms.  - Consider nasal saline rinses 1-2 times daily to remove allergens from the nasal cavities as well as help with mucous clearance (this is especially helpful to do before the nasal sprays are given) - Consider allergy shots as a means of long-term control. - Allergy shots "re-train" and "reset" the immune system to ignore environmental allergens and decrease the resulting immune response to those allergens (sneezing, itchy watery eyes, runny nose, nasal congestion, etc).    - Allergy shots improve symptoms in 75-85% of patients.  - We can discuss more at the next appointment if the medications are not working for you. - Allergy shots can help with asthma as well as allergies, so this might be a good option for you. - CPT codes provided for allergy shots.   3.  Return in about 3 months (around 05/14/2022).    Please inform us of any Emergency Department visits, hospitalizations, or changes in symptoms. Call us before going to the ED for breathing or allergy symptoms since we might be able to fit you in for a sick visit. Feel free to contact us anytime with any questions, problems, or concerns.  It was a pleasure to meet you today!  Websites that have reliable patient information: 1. American Academy of Asthma, Allergy, and Immunology: www.aaaai.org 2. Food Allergy Research and Education (FARE): foodallergy.org 3. Mothers of Asthmatics: http://www.asthmacommunitynetwork.org 4. American College of Allergy, Asthma, and Immunology: www.acaai.org   COVID-19 Vaccine Information can be found at: ShippingScam.co.uk For questions related to vaccine distribution or appointments, please email [email or web address] or call [phone].   We realize that you might be concerned about having an allergic reaction to the COVID19 vaccines. To help with that concern, WE ARE OFFERING THE COVID19 VACCINES IN OUR OFFICE! Ask the front desk for dates!     "Like" [place] on Facebook and Instagram for our latest updates!      A healthy democracy works best when [employer] participate! Make sure you are registered to vote! If you have moved or changed any of your contact information, you will need to get this updated before voting!  In some cases, you MAY be able to register to vote online: [email or web address]

## 2022-02-13 ENCOUNTER — Encounter: Payer: Self-pay | Admitting: Allergy & Immunology

## 2022-02-13 MED ORDER — CETIRIZINE HCL 10 MG PO TABS: 10.0000 mg | ORAL_TABLET | Freq: Every day | ORAL | 5 refills | Status: DC

## 2022-02-14 ENCOUNTER — Telehealth: Payer: Self-pay

## 2022-02-14 ENCOUNTER — Ambulatory Visit (INDEPENDENT_AMBULATORY_CARE_PROVIDER_SITE_OTHER): Payer: PPO

## 2022-02-14 DIAGNOSIS — J4550 Severe persistent asthma, uncomplicated: Secondary | ICD-10-CM | POA: Diagnosis not present

## 2022-02-14 NOTE — Telephone Encounter (Signed)
Patient came in today and received his injection. Patient also dropped off his [place] paperwork. I have sent the papers over via fax to [place] in the [place] office and placed copies in bulk scanning. Patient was also given a copy as well for his records.

## 2022-02-19 ENCOUNTER — Telehealth: Payer: Self-pay | Admitting: *Deleted

## 2022-02-19 NOTE — Telephone Encounter (Signed)
-----   Message from Valentina Shaggy, MD sent at 02/13/2022  8:36 AM EST ----- Changing to [place] to see if that is more effective.

## 2022-02-19 NOTE — Telephone Encounter (Signed)
L/m to call me will need patient to sign new forms for Dupixent PAP

## 2022-02-20 NOTE — Telephone Encounter (Signed)
Spoke to patient and have located the app patient signed

## 2022-03-04 ENCOUNTER — Other Ambulatory Visit (HOSPITAL_COMMUNITY): Payer: Self-pay

## 2022-03-06 DIAGNOSIS — R609 Edema, unspecified: Secondary | ICD-10-CM | POA: Diagnosis not present

## 2022-03-06 DIAGNOSIS — I10 Essential (primary) hypertension: Secondary | ICD-10-CM | POA: Diagnosis not present

## 2022-03-14 ENCOUNTER — Ambulatory Visit: Payer: PPO

## 2022-06-04 ENCOUNTER — Other Ambulatory Visit: Payer: Self-pay

## 2022-06-04 ENCOUNTER — Ambulatory Visit (INDEPENDENT_AMBULATORY_CARE_PROVIDER_SITE_OTHER): Payer: PPO | Admitting: Allergy & Immunology

## 2022-06-04 ENCOUNTER — Encounter: Payer: Self-pay | Admitting: Allergy & Immunology

## 2022-06-04 VITALS — BP 138/82 | HR 89 | Temp 98.6°F | Resp 20 | Ht 69.0 in | Wt 308.0 lb

## 2022-06-04 DIAGNOSIS — J4489 Other specified chronic obstructive pulmonary disease: Secondary | ICD-10-CM

## 2022-06-04 DIAGNOSIS — R21 Rash and other nonspecific skin eruption: Secondary | ICD-10-CM | POA: Diagnosis not present

## 2022-06-04 DIAGNOSIS — Z91018 Allergy to other foods: Secondary | ICD-10-CM

## 2022-06-04 DIAGNOSIS — J4540 Moderate persistent asthma, uncomplicated: Secondary | ICD-10-CM | POA: Diagnosis not present

## 2022-06-04 DIAGNOSIS — J3089 Other allergic rhinitis: Secondary | ICD-10-CM | POA: Diagnosis not present

## 2022-06-04 DIAGNOSIS — J302 Other seasonal allergic rhinitis: Secondary | ICD-10-CM

## 2022-06-04 NOTE — Progress Notes (Signed)
FOLLOW UP  Date of Service/Encounter:  06/04/22   Assessment:   Asthma-COPD overlap syndrome - without improvement with Nucal or Dupixent    Physically deconditioned - likely contributing to his SOB   History of smoking - but stopped   Seasonal and perennial allergic rhinitis (grasses, ragweed, weeds, trees, indoor molds, outdoor molds, dust mites, cat, dog, and cockroach)   Elevated absolute eosinophil count - likely secondary to uncontrolled atopic disease, but could consider hypereosinophilic work-up in the future if indicated   Plan/Recommendations:   1. Asthma-COPD overlap syndrome - Lung testing looks stable today. - Let's stop the Dupixent to see if anything changes.   - I will talk to Dr. Sherene Sires.  - Daily controller medication(s): Breztri two puffs twice with spacer and Singulair (montelukast) 10mg  daily - Prior to physical activity: albuterol 2 puffs 10-15 minutes before physical activity. - Rescue medications: albuterol 4 puffs every 4-6 hours as needed - Asthma control goals:  * Full participation in all desired activities (may need albuterol before activity) * Albuterol use two time or less a week on average (not counting use with activity) * Cough interfering with sleep two time or less a month * Oral steroids no more than once a year * No hospitalizations  2. Seasonal and perennial allergic rhinitis - Previous testing showed: grasses, ragweed, weeds, trees, indoor molds, outdoor molds, dust mites, cat, dog, and cockroach  - Continue with: Singulair (montelukast) 10mg  daily - Start taking: Zyrtec (cetirizine) 10mg  tablet once daily - You can use an extra dose of the antihistamine, if needed, for breakthrough symptoms.  - Consider nasal saline rinses 1-2 times daily to remove allergens from the nasal cavities as well as help with mucous clearance (this is especially helpful to do before the nasal sprays are given) - Consider allergy shots.  3. Return in about 6  weeks (around 07/16/2022).    Subjective:   Thomas [name] is a 62 y.o. male presenting today for follow up of  Chief Complaint  Patient presents with   Follow-up    Pt states he stills feels the same with injections and meds he is taking. Pt states he is having leg pain after taking injection    Thomas [name] has a history of the following: Patient Active Problem List   Diagnosis Date Noted   Chronic sinusitis 12/12/2020   Acute on chronic diastolic HF (heart failure)    Acute hypoxemic respiratory failure 01/24/2020   Obesity, Class III, BMI 40-49.9 (morbid obesity) 05/05/2019   COPD exacerbation 05/04/2019   Hypoxia    Leg edema    COPD with acute exacerbation 09/16/2018   CKD (chronic kidney disease), stage III 09/16/2018   Acute respiratory failure with hypoxia 09/16/2018   Allergic rhinitis 08/26/2018   ARF (acute renal failure) 10/17/2015   Hypertension    Asthmatic bronchitis , chronic    GERD (gastroesophageal reflux disease)    Arthritis    Kidney stones    Prostate cancer (HCC)    Thyroid nodule    OSA (obstructive sleep apnea)    Medial meniscus tear    Lateral meniscus tear    Sleep apnea 11/29/2009   HYPERSOMNIA 09/26/2009   DYSPNEA 09/26/2009   PROSTATE CANCER, HX OF 09/26/2009   Essential hypertension 09/25/2009    History obtained from: chart review and patient.  Thomas [name] is a 61 y.o. male presenting for a follow up visit.  We last saw him in December 2023.  At that time,  we did not do lung testing since our spirometry machine was not working.  We managed to change him from Nucala to Dupixent.  For his environmental allergies, we continue with Singulair and started Zyrtec 10 mg daily.  We did talk about allergy shots for long-term control.  Since the last visit, he has been about the same.   Asthma/Respiratory Symptom History: He remains stable. He does not do a lot becaue he looks after his wife. He is on the [place] two puffs twice daily. He  does report that the Dupixent injections hurt. He tells me that they remain tender. He was getting a knot in the skin and leaving it out longer tended to make it better.  His breathing is about the same. He does get short winded when he does ADLs. He would like to stop the   Allergic Rhinitis Symptom History: He remains on the Singulair. He does not think that he needs allergy shots at all. He is open to starting an antihistamine. But his main concern is his breathing.    Otherwise, there have been no changes to his past medical history, surgical history, family history, or social history.    Review of Systems  Constitutional:  Negative for chills, fever and weight loss.  HENT:  Negative for congestion, ear discharge, nosebleeds and sinus pain.        Denies rhinorrhea, nasal congestion, and postnasal drip since stopping both Zyrtec and Allegra  Eyes:  Negative for blurred vision and double vision.  Respiratory:  Positive for cough, shortness of breath and wheezing. Negative for stridor.        Reports cough that can be dry at times and productive at times.  When it is productive it can be clear to dark gray.  He also reports wheezing and shortness of breath.  Denies tightness in his chest and nocturnal awakenings due to breathing problems.  Cardiovascular:  Negative for chest pain and palpitations.  Gastrointestinal:  Negative for abdominal pain, heartburn, nausea and vomiting.       Reports reflux from time to time and has a prescription for Pepcid to use as needed  Genitourinary:  Negative for frequency.  Skin:  Negative for itching and rash.       Reports itchy rash that started a couple weeks after being skin tested for environmental allergens  Neurological:  Negative for headaches.  Endo/Heme/Allergies:  Positive for environmental allergies.       Objective:   Blood pressure 138/82, pulse 89, temperature 98.6 F (37 C), resp. rate 20, height  (1.753 m), weight (!) 308 lb  (139.7 kg), SpO2 97 %. Body mass index is 45.48 kg/m.    Physical Exam Vitals reviewed.  Constitutional:      Appearance: He is well-developed.  HENT:     Head: Normocephalic and atraumatic.     Right Ear: Tympanic membrane, ear canal and external ear normal. No drainage, swelling or tenderness. Tympanic membrane is not injected, scarred, erythematous, retracted or bulging.     Left Ear: Tympanic membrane, ear canal and external ear normal. No drainage, swelling or tenderness. Tympanic membrane is not injected, scarred, erythematous, retracted or bulging.     Nose: No nasal deformity, septal deviation, mucosal edema or rhinorrhea.     Right Turbinates: Enlarged, swollen and pale.     Left Turbinates: Enlarged, swollen and pale.     Right Sinus: No maxillary sinus tenderness or frontal sinus tenderness.     Left Sinus: No  maxillary sinus tenderness or frontal sinus tenderness.     Comments: No nasal polyps noted.     Mouth/Throat:     Lips: Pink.     Mouth: Mucous membranes are moist. Mucous membranes are not pale and not dry.     Pharynx: Uvula midline.     Comments: Cobblestoning in the posterior oropharynx.  Eyes:     General: Lids are normal. Allergic shiner present.        Right eye: No discharge.        Left eye: No discharge.     Conjunctiva/sclera: Conjunctivae normal.     Right eye: Right conjunctiva is not injected. No chemosis.    Left eye: Left conjunctiva is not injected. No chemosis.    Pupils: Pupils are equal, round, and reactive to light.  Cardiovascular:     Rate and Rhythm: Normal rate and regular rhythm.     Heart sounds: Normal heart sounds.  Pulmonary:     Effort: Pulmonary effort is normal. No tachypnea, accessory muscle usage or respiratory distress.     Breath sounds: Normal breath sounds. No wheezing, rhonchi or rales.     Comments: Moving air well in all lung fields. Speaking in full sentences.  Chest:     Chest wall: No tenderness.  Abdominal:      Tenderness: There is no abdominal tenderness. There is no guarding or rebound.  Lymphadenopathy:     Head:     Right side of head: No submandibular, tonsillar or occipital adenopathy.     Left side of head: No submandibular, tonsillar or occipital adenopathy.     Cervical: No cervical adenopathy.  Skin:    General: Skin is warm.     Capillary Refill: Capillary refill takes less than 2 seconds.     Coloration: Skin is not pale.     Findings: No abrasion, erythema, petechiae or rash. Rash is not papular, urticarial or vesicular.  Neurological:     Mental Status: He is alert.  Psychiatric:        Behavior: Behavior is cooperative.      Diagnostic studies:    Spirometry: results normal (FEV1: 2.61/79%, FVC: 3.68/86%, FEV1/FVC: 71%).    Spirometry consistent with normal pattern.   Allergy Studies: none      Malachi Bonds, MD  [facility]

## 2022-06-04 NOTE — Patient Instructions (Addendum)
1. Asthma-COPD overlap syndrome - Lung testing looks stable today. - Let's stop the Dupixent to see if anything changes.   - I will talk to Dr. [name].  - Daily controller medication(s): Breztri two puffs twice with spacer and Singulair (montelukast) 10mg  daily - Prior to physical activity: albuterol 2 puffs 10-15 minutes before physical activity. - Rescue medications: albuterol 4 puffs every 4-6 hours as needed - Asthma control goals:  * Full participation in all desired activities (may need albuterol before activity) * Albuterol use two time or less a week on average (not counting use with activity) * Cough interfering with sleep two time or less a month * Oral steroids no more than once a year * No hospitalizations  2. Seasonal and perennial allergic rhinitis - Previous testing showed: grasses, ragweed, weeds, trees, indoor molds, outdoor molds, dust mites, cat, dog, and cockroach  - Continue with: Singulair (montelukast) 10mg  daily - Start taking: Zyrtec (cetirizine) 10mg  tablet once daily - You can use an extra dose of the antihistamine, if needed, for breakthrough symptoms.  - Consider nasal saline rinses 1-2 times daily to remove allergens from the nasal cavities as well as help with mucous clearance (this is especially helpful to do before the nasal sprays are given) - Consider allergy shots.  3. Return in about 6 weeks (around 07/16/2022).    Please inform us of any Emergency Department visits, hospitalizations, or changes in symptoms. Call us before going to the ED for breathing or allergy symptoms since we might be able to fit you in for a sick visit. Feel free to contact us anytime with any questions, problems, or concerns.  It was a pleasure to meet you today!  Websites that have reliable patient information: 1. American Academy of Asthma, Allergy, and Immunology: www.aaaai.org 2. Food Allergy Research and Education (FARE): foodallergy.org 3. Mothers of Asthmatics:  http://www.asthmacommunitynetwork.org 4. American College of Allergy, Asthma, and Immunology: www.acaai.org   COVID-19 Vaccine Information can be found at: [email or web address].[email or web address] For questions related to vaccine distribution or appointments, please email [email or web address] or call [phone].   We realize that you might be concerned about having an allergic reaction to the COVID19 vaccines. To help with that concern, WE ARE OFFERING THE COVID19 VACCINES IN OUR OFFICE! Ask the front desk for dates!     "Like" [place] on Facebook and Instagram for our latest updates!      A healthy democracy works best when [employer] participate! Make sure you are registered to vote! If you have moved or changed any of your contact information, you will need to get this updated before voting!  In some cases, you MAY be able to register to vote online: [email or web address]    Allergy Shots  Allergies are the result of a chain reaction that starts in the immune system. Your immune system controls how your body defends itself. For instance, if you have an allergy to pollen, your immune system identifies pollen as an invader or allergen. Your immune system overreacts by producing antibodies called Immunoglobulin E (IgE). These antibodies travel to cells that release chemicals, causing an allergic reaction.  The concept behind allergy immunotherapy, whether it is received in the form of shots or tablets, is that the immune system can be desensitized to specific allergens that trigger allergy symptoms. Although it requires time and patience, the payback can be long-term relief. Allergy injections contain a dilute solution of those substances that you are allergic to based upon your skin testing and allergy history.  How Do Allergy Shots Work?  Allergy shots work much like a vaccine. Your body responds to injected amounts of a  particular allergen given in increasing doses, eventually developing a resistance and tolerance to it. Allergy shots can lead to decreased, minimal or no allergy symptoms.  There generally are two phases: build-up and maintenance. Build-up often ranges from three to six months and involves receiving injections with increasing amounts of the allergens. The shots are typically given once or twice a week, though more rapid build-up schedules are sometimes used.  The maintenance phase begins when the most effective dose is reached. This dose is different for each person, depending on how allergic you are and your response to the build-up injections. Once the maintenance dose is reached, there are longer periods between injections, typically two to four weeks.  Occasionally doctors give cortisone-type shots that can temporarily reduce allergy symptoms. These types of shots are different and should not be confused with allergy immunotherapy shots.  Who Can Be Treated with Allergy Shots?  Allergy shots may be a good treatment approach for people with allergic rhinitis (hay fever), allergic asthma, conjunctivitis (eye allergy) or stinging insect allergy.   Before deciding to begin allergy shots, you should consider:   The length of allergy season and the severity of your symptoms  Whether medications and/or changes to your environment can control your symptoms  Your desire to avoid long-term medication use  Time: allergy immunotherapy requires a major time commitment  Cost: may vary depending on your insurance coverage  Allergy shots for children age 85 and older are effective and often well tolerated. They might prevent the onset of new allergen sensitivities or the progression to asthma.  Allergy shots are not started on patients who are pregnant but can be continued on patients who become pregnant while receiving them. In some patients with other medical conditions or who take certain common  medications, allergy shots may be of risk. It is important to mention other medications you talk to your allergist.   What are the two types of build-ups offered:   RUSH or Rapid Desensitization -- one day of injections lasting from 8:30-4:30pm, injections every 1 hour.  Approximately half of the build-up process is completed in that one day.  The following week, normal build-up is resumed, and this entails ~16 visits either weekly or twice weekly, until reaching your "maintenance dose" which is continued weekly until eventually getting spaced out to every month for a duration of 3 to 5 years. The regular build-up appointments are nurse visits where the injections are administered, followed by required monitoring for 30 minutes.    Traditional build-up -- weekly visits for 6 -12 months until reaching "maintenance dose", then continue weekly until eventually spacing out to every 4 weeks as above. At these appointments, the injections are administered, followed by required monitoring for 30 minutes.     Either way is acceptable, and both are equally effective. With the rush protocol, the advantage is that less time is spent here for injections overall AND you would also reach maintenance dosing faster (which is when the clinical benefit starts to become more apparent). Not everyone is a candidate for rapid desensitization.   IF we proceed with the RUSH protocol, there are premedications which must be taken the day before and the day after the rush only (this includes antihistamines, steroids, and Singulair).  After the rush day, no prednisone or Singulair is required, and we just recommend antihistamines taken on your  injection day.  What Is An Estimate of the Costs?  If you are interested in starting allergy injections, please check with your insurance company about your coverage for both allergy vial sets and allergy injections.  Please do so prior to making the appointment to start injections.  The  following are CPT codes to give to your insurance company. These are the amounts we BILL to the insurance company, but the amount YOU WILL PAY and [place] and depends on the contracts we have with different insurance companies.   Amount Billed to Insurance One allergy vial set  CPT 95165   $ 1200     Two allergy vial set  CPT 95165   $ 2400     Three allergy vial set  CPT 95165   $ 3600     One injection   CPT 95115   $ 35  Two injections   CPT 95117   $ 40 RUSH (Rapid Desensitization) CPT 95180 x 8 hours $500/hour  Regarding the allergy injections, your co-pay may or may not apply with each injection, so please confirm this with your insurance company. When you start allergy injections, 1 or 2 sets of vials are made based on your allergies.  Not all patients can be on one set of vials. A set of vials lasts 6 months to a year depending on how quickly you can proceed with your build-up of your allergy injections. Vials are personalized for each patient depending on their specific allergens.  How often are allergy injection given during the build-up period?   Injections are given at least weekly during the build-up period until your maintenance dose is achieved. Per the doctor's discretion, you may have the option of getting allergy injections two times per week during the build-up period. However, there must be at least 48 hours between injections. The build-up period is usually completed within 6-12 months depending on your ability to schedule injections and for adjustments for reactions. When maintenance dose is reached, your injection schedule is gradually changed to every two weeks and later to every three weeks. Injections will then continue every 4 weeks. Usually, injections are continued for a total of 3-5 years.   When Will I Feel Better?  Some may experience decreased allergy symptoms during the build-up phase. For others, it may take as long as 12 months on the  maintenance dose. If there is no improvement after a year of maintenance, your allergist will discuss other treatment options with you.  If you aren't responding to allergy shots, it may be because there is not enough dose of the allergen in your vaccine or there are missing allergens that were not identified during your allergy testing. Other reasons could be that there are high levels of the allergen in your environment or major exposure to non-allergic triggers like tobacco smoke.  What Is the Length of Treatment?  Once the maintenance dose is reached, allergy shots are generally continued for three to five years. The decision to stop should be discussed with your allergist at that time. Some people may experience a permanent reduction of allergy symptoms. Others may relapse and a longer course of allergy shots can be considered.  What Are the Possible Reactions?  The two types of adverse reactions that can occur with allergy shots are local and systemic. Common local reactions include very mild redness and swelling at the injection site, which can happen immediately or several hours after. Report a delayed reaction  from your last injection. These include arm swelling or runny nose, watery eyes or cough that occurs within 12-24 hours after injection. A systemic reaction, which is less common, affects the entire body or a particular body system. They are usually mild and typically respond quickly to medications. Signs include increased allergy symptoms such as sneezing, a stuffy nose or hives.   Rarely, a serious systemic reaction called anaphylaxis can develop. Symptoms include swelling in the throat, wheezing, a feeling of tightness in the chest, nausea or dizziness. Most serious systemic reactions develop within 30 minutes of allergy shots. This is why it is strongly recommended you wait in your doctor's office for 30 minutes after your injections. Your allergist is trained to watch for reactions,  and his or her staff is trained and equipped with the proper medications to identify and treat them.   Report to the nurse immediately if you experience any of the following symptoms: swelling, itching or redness of the skin, hives, watery eyes/nose, breathing difficulty, excessive sneezing, coughing, stomach pain, diarrhea, or light headedness. These symptoms may occur within 15-20 minutes after injection and may require medication.   Who Should Administer Allergy Shots?  The preferred location for receiving shots is your prescribing allergist's office. Injections can sometimes be given at another facility where the physician and staff are trained to recognize and treat reactions, and have received instructions by your prescribing allergist.  What if I am late for an injection?   Injection dose will be adjusted depending upon how many days or weeks you are late for your injection.   What if I am sick?   Please report any illness to the nurse before receiving injections. She may adjust your dose or postpone injections depending on your symptoms. If you have fever, flu, sinus infection or chest congestion it is best to postpone allergy injections until you are better. Never get an allergy injection if your asthma is causing you problems. If your symptoms persist, seek out medical care to get your health problem under control.  What If I am or Become Pregnant:  Women that become pregnant should schedule an appointment with [facility] before receiving any further allergy injections.

## 2022-06-06 ENCOUNTER — Encounter: Payer: Self-pay | Admitting: Allergy & Immunology

## 2022-06-06 ENCOUNTER — Telehealth: Payer: Self-pay

## 2022-06-06 NOTE — Telephone Encounter (Signed)
I called patient and informed that a discontinuation has been placed to be mailed out to the address on file. I confirmed address with patient. I also informed that he can call his insurance and try to drop the charge.

## 2022-06-16 DIAGNOSIS — N1831 Chronic kidney disease, stage 3a: Secondary | ICD-10-CM | POA: Diagnosis not present

## 2022-06-16 DIAGNOSIS — Z79899 Other long term (current) drug therapy: Secondary | ICD-10-CM | POA: Diagnosis not present

## 2022-06-16 DIAGNOSIS — I10 Essential (primary) hypertension: Secondary | ICD-10-CM | POA: Diagnosis not present

## 2022-06-16 NOTE — Telephone Encounter (Signed)
Patient came in and dropped off his signed discontinuation of Nucala. Patient was given a copy for his files as well.

## 2022-06-23 DIAGNOSIS — N1831 Chronic kidney disease, stage 3a: Secondary | ICD-10-CM | POA: Diagnosis not present

## 2022-06-23 DIAGNOSIS — I10 Essential (primary) hypertension: Secondary | ICD-10-CM | POA: Diagnosis not present

## 2022-06-23 DIAGNOSIS — R609 Edema, unspecified: Secondary | ICD-10-CM | POA: Diagnosis not present

## 2022-06-30 DIAGNOSIS — M65321 Trigger finger, right index finger: Secondary | ICD-10-CM | POA: Diagnosis not present

## 2022-07-30 DIAGNOSIS — M65321 Trigger finger, right index finger: Secondary | ICD-10-CM | POA: Diagnosis not present

## 2022-08-01 ENCOUNTER — Encounter: Payer: Self-pay | Admitting: Allergy & Immunology

## 2022-08-01 ENCOUNTER — Ambulatory Visit (INDEPENDENT_AMBULATORY_CARE_PROVIDER_SITE_OTHER): Payer: PPO | Admitting: Allergy & Immunology

## 2022-08-01 ENCOUNTER — Other Ambulatory Visit: Payer: Self-pay

## 2022-08-01 VITALS — BP 128/86 | HR 81 | Temp 99.5°F | Resp 20 | Ht 70.0 in | Wt 301.4 lb

## 2022-08-01 DIAGNOSIS — R21 Rash and other nonspecific skin eruption: Secondary | ICD-10-CM | POA: Diagnosis not present

## 2022-08-01 DIAGNOSIS — J4489 Other specified chronic obstructive pulmonary disease: Secondary | ICD-10-CM

## 2022-08-01 DIAGNOSIS — J3089 Other allergic rhinitis: Secondary | ICD-10-CM

## 2022-08-01 DIAGNOSIS — J302 Other seasonal allergic rhinitis: Secondary | ICD-10-CM

## 2022-08-01 DIAGNOSIS — Z91018 Allergy to other foods: Secondary | ICD-10-CM

## 2022-08-01 MED ORDER — MONTELUKAST SODIUM 10 MG PO TABS: 10.0000 mg | ORAL_TABLET | Freq: Every evening | ORAL | 1 refills | Status: DC

## 2022-08-01 MED ORDER — BREZTRI AEROSPHERE 160-9-4.8 MCG/ACT IN AERO: INHALATION_SPRAY | RESPIRATORY_TRACT | 5 refills | Status: DC

## 2022-08-01 MED ORDER — ALBUTEROL SULFATE HFA 108 (90 BASE) MCG/ACT IN AERS: 2.0000 | INHALATION_SPRAY | RESPIRATORY_TRACT | 1 refills | Status: DC | PRN

## 2022-08-01 MED ORDER — CETIRIZINE HCL 10 MG PO TABS: 10.0000 mg | ORAL_TABLET | Freq: Two times a day (BID) | ORAL | 1 refills | Status: DC

## 2022-08-01 MED ORDER — CLOBETASOL PROPIONATE 0.05 % EX OINT: 1.0000 | TOPICAL_OINTMENT | Freq: Two times a day (BID) | CUTANEOUS | 5 refills | Status: AC | PRN

## 2022-08-01 NOTE — Patient Instructions (Addendum)
1. Asthma-COPD overlap syndrome - Lung testing looks stable today. - Daily controller medication(s): Breztri two puffs twice with spacer and Singulair (montelukast) 10mg  daily - Prior to physical activity: albuterol 2 puffs 10-15 minutes before physical activity. - Rescue medications: albuterol 4 puffs every 4-6 hours as needed - Asthma control goals:  * Full participation in all desired activities (may need albuterol before activity) * Albuterol use two time or less a week on average (not counting use with activity) * Cough interfering with sleep two time or less a month * Oral steroids no more than once a year * No hospitalizations  2. Seasonal and perennial allergic rhinitis - Previous testing showed: grasses, ragweed, weeds, trees, indoor molds, outdoor molds, dust mites, cat, dog, and cockroach  - Continue with: Singulair (montelukast) 10mg  daily - Start taking: Zyrtec (cetirizine) 10mg  tablet once daily - You can use an extra dose of the antihistamine, if needed, for breakthrough symptoms.  - Consider nasal saline rinses 1-2 times daily to remove allergens from the nasal cavities as well as help with mucous clearance (this is especially helpful to do before the nasal sprays are given) - Consider allergy shots.  3.Rash - looks like psoriasis - Start clobetasol ointment twice daily for up to two weeks. - This should not use this on your face.   3. Return in about 6 months (around 01/31/2023).    Please inform us of any Emergency Department visits, hospitalizations, or changes in symptoms. Call us before going to the ED for breathing or allergy symptoms since we might be able to fit you in for a sick visit. Feel free to contact us anytime with any questions, problems, or concerns.  It was a pleasure to meet you today!  Websites that have reliable patient information: 1. American Academy of Asthma, Allergy, and Immunology: www.aaaai.org 2. Food Allergy Research and Education  (FARE): foodallergy.org 3. Mothers of Asthmatics: http://www.asthmacommunitynetwork.org 4. American College of Allergy, Asthma, and Immunology: www.acaai.org   COVID-19 Vaccine Information can be found at: [email or web address] For questions related to vaccine distribution or appointments, please email [email or web address] or call [phone].   We realize that you might be concerned about having an allergic reaction to the COVID19 vaccines. To help with that concern, WE ARE OFFERING THE COVID19 VACCINES IN OUR OFFICE! Ask the front desk for dates!     "Like" [place] on Facebook and Instagram for our latest updates!      A healthy democracy works best when [employer] participate! Make sure you are registered to vote! If you have moved or changed any of your contact information, you will need to get this updated before voting!  In some cases, you MAY be able to register to vote online: [email or web address]

## 2022-08-01 NOTE — Progress Notes (Unsigned)
FOLLOW UP  Date of Service/Encounter:  08/01/22   Assessment:   Asthma-COPD overlap syndrome - without improvement with Nucala or Dupixent (feels the same without either of these on board)   Physically deconditioned - likely contributing to his SOB   History of smoking - but stopped   Seasonal and perennial allergic rhinitis (grasses, ragweed, weeds, trees, indoor molds, outdoor molds, dust mites, cat, dog, and cockroach)   Elevated absolute eosinophil count - likely secondary to uncontrolled atopic disease, but could consider hypereosinophilic work-up in the future if indicated  Plan/Recommendations:   1. Asthma-COPD overlap syndrome - Lung testing looks stable today. - Daily controller medication(s): Breztri two puffs twice with spacer and Singulair (montelukast) 10mg  daily - Prior to physical activity: albuterol 2 puffs 10-15 minutes before physical activity. - Rescue medications: albuterol 4 puffs every 4-6 hours as needed - Asthma control goals:  * Full participation in all desired activities (may need albuterol before activity) * Albuterol use two time or less a week on average (not counting use with activity) * Cough interfering with sleep two time or less a month * Oral steroids no more than once a year * No hospitalizations  2. Seasonal and perennial allergic rhinitis - Previous testing showed: grasses, ragweed, weeds, trees, indoor molds, outdoor molds, dust mites, cat, dog, and cockroach  - Continue with: Singulair (montelukast) 10mg  daily - Start taking: Zyrtec (cetirizine) 10mg  tablet once daily - You can use an extra dose of the antihistamine, if needed, for breakthrough symptoms.  - Consider nasal saline rinses 1-2 times daily to remove allergens from the nasal cavities as well as help with mucous clearance (this is especially helpful to do before the nasal sprays are given) - Consider allergy shots.  3. Rash - looks like psoriasis - Start clobetasol  ointment twice daily for up to two weeks. - This should not use this on your face.   4. Return in about 6 months (around 01/31/2023).     Subjective:   Thomas [name] is a 62 y.o. male presenting today for follow up of  Chief Complaint  Patient presents with   COPD    Says he has had issues breathing for a while now. Unsure on how his condition is.     Thomas [name] has a history of the following: Patient Active Problem List   Diagnosis Date Noted   Chronic sinusitis 12/12/2020   Acute on chronic diastolic HF (heart failure) (HCC)    Acute hypoxemic respiratory failure (HCC) 01/24/2020   Obesity, Class III, BMI 40-49.9 (morbid obesity) (HCC) 05/05/2019   COPD exacerbation (HCC) 05/04/2019   Hypoxia    Leg edema    COPD with acute exacerbation (HCC) 09/16/2018   CKD (chronic kidney disease), stage III (HCC) 09/16/2018   Acute respiratory failure with hypoxia (HCC) 09/16/2018   Allergic rhinitis 08/26/2018   ARF (acute renal failure) (HCC) 10/17/2015   Hypertension    Asthmatic bronchitis , chronic    GERD (gastroesophageal reflux disease)    Arthritis    Kidney stones    Prostate cancer (HCC)    Thyroid nodule    OSA (obstructive sleep apnea)    Medial meniscus tear    Lateral meniscus tear    Sleep apnea 11/29/2009   HYPERSOMNIA 09/26/2009   DYSPNEA 09/26/2009   PROSTATE CANCER, HX OF 09/26/2009   Essential hypertension 09/25/2009    History obtained from: chart review and patient.  Thomas [name] is a 62 y.o. male presenting  for a follow up visit.  He was last seen in October 2024.  At that time, lung testing looks stable.  We decided to stop the Dupixent to see if anything changed.  For his rhinitis, we continue with Singulair and added Zyrtec.  Since last visit, he has done well.   Asthma/Respiratory Symptom History: He remains on the Breztri two puffs twice daily. He knows that this helps because he ran out of a prescription and he could tell the difference in  his symptoms.  He remains on the montelukast daily as well. He is not sure that this is doing anything, but he is going to continue on this. He is not interested in trying another biologic since neither of them seemed to do anything for his breathing.    Allergic Rhinitis Symptom History: He did develop some more congestion since stopping the Dupixent. He reports that there was a lot of throat congestion that is coughed up.  He did try Flonase in the past, but he never really felt that it did much. He only used it when the pollen was coming out.   Otherwise, there have been no changes to his past medical history, surgical history, family history, or social history.    Review of Systems  Constitutional:  Negative for chills, fever and weight loss.  HENT:  Negative for congestion, ear discharge, nosebleeds and sinus pain.        Denies rhinorrhea, nasal congestion, and postnasal drip since stopping both Zyrtec and Allegra  Eyes:  Negative for blurred vision and double vision.  Respiratory:  Positive for shortness of breath. Negative for cough, wheezing and stridor.        Reports cough that can be dry at times and productive at times.  When it is productive it can be clear to dark gray.  He also reports wheezing and shortness of breath.  Denies tightness in his chest and nocturnal awakenings due to breathing problems.  Cardiovascular:  Negative for chest pain and palpitations.  Gastrointestinal:  Negative for abdominal pain, heartburn, nausea and vomiting.       Reports reflux from time to time and has a prescription for Pepcid to use as needed  Genitourinary:  Negative for frequency.  Skin:  Negative for itching and rash.       Reports itchy rash that started a couple weeks after being skin tested for environmental allergens  Neurological:  Negative for headaches.  Endo/Heme/Allergies:  Positive for environmental allergies. Does not bruise/bleed easily.       Objective:   Blood pressure  128/86, pulse 81, temperature 99.5 F (37.5 C), temperature source Temporal, resp. rate 20, height 5\' 10"  (1.778 m), weight (!) 301 lb 6.4 oz (136.7 kg), SpO2 97 %. Body mass index is 43.25 kg/m.    Physical Exam Vitals reviewed.  Constitutional:      Appearance: He is well-developed.  HENT:     Head: Normocephalic and atraumatic.     Right Ear: Tympanic membrane, ear canal and external ear normal. No drainage, swelling or tenderness. Tympanic membrane is not injected, scarred, erythematous, retracted or bulging.     Left Ear: Tympanic membrane, ear canal and external ear normal. No drainage, swelling or tenderness. Tympanic membrane is not injected, scarred, erythematous, retracted or bulging.     Nose: No nasal deformity, septal deviation, mucosal edema or rhinorrhea.     Right Turbinates: Enlarged, swollen and pale.     Left Turbinates: Enlarged, swollen and pale.  Right Sinus: No maxillary sinus tenderness or frontal sinus tenderness.     Left Sinus: No maxillary sinus tenderness or frontal sinus tenderness.     Comments: No nasal polyps noted.     Mouth/Throat:     Lips: Pink.     Mouth: Mucous membranes are moist. Mucous membranes are not pale and not dry.     Pharynx: Uvula midline.     Comments: Cobblestoning in the posterior oropharynx.  Eyes:     General: Lids are normal. Allergic shiner present.        Right eye: No discharge.        Left eye: No discharge.     Conjunctiva/sclera: Conjunctivae normal.     Right eye: Right conjunctiva is not injected. No chemosis.    Left eye: Left conjunctiva is not injected. No chemosis.    Pupils: Pupils are equal, round, and reactive to light.  Cardiovascular:     Rate and Rhythm: Normal rate and regular rhythm.     Heart sounds: Normal heart sounds.  Pulmonary:     Effort: Pulmonary effort is normal. No tachypnea, accessory muscle usage or respiratory distress.     Breath sounds: Normal breath sounds. No wheezing, rhonchi or  rales.     Comments: Moving air well in all lung fields. Speaking in full sentences.  Chest:     Chest wall: No tenderness.  Abdominal:     Tenderness: There is no abdominal tenderness. There is no guarding or rebound.  Lymphadenopathy:     Head:     Right side of head: No submandibular, tonsillar or occipital adenopathy.     Left side of head: No submandibular, tonsillar or occipital adenopathy.     Cervical: No cervical adenopathy.  Skin:    General: Skin is warm.     Capillary Refill: Capillary refill takes less than 2 seconds.     Coloration: Skin is not pale.     Findings: No abrasion, erythema, petechiae or rash. Rash is not papular, urticarial or vesicular.  Neurological:     Mental Status: He is alert.  Psychiatric:        Behavior: Behavior is cooperative.      Diagnostic studies:    Spirometry: results normal (FEV1: 3.01/86%, FVC: 4.49/98%, FEV1/FVC: 67%).    Spirometry consistent with normal pattern.   Allergy Studies: none       Malachi Bonds, MD  [facility]

## 2022-08-04 ENCOUNTER — Encounter: Payer: Self-pay | Admitting: Allergy & Immunology

## 2022-09-18 DIAGNOSIS — R609 Edema, unspecified: Secondary | ICD-10-CM | POA: Diagnosis not present

## 2022-09-18 DIAGNOSIS — I10 Essential (primary) hypertension: Secondary | ICD-10-CM | POA: Diagnosis not present

## 2022-09-18 DIAGNOSIS — N1831 Chronic kidney disease, stage 3a: Secondary | ICD-10-CM | POA: Diagnosis not present

## 2022-09-18 DIAGNOSIS — Z79899 Other long term (current) drug therapy: Secondary | ICD-10-CM | POA: Diagnosis not present

## 2022-09-25 DIAGNOSIS — N1831 Chronic kidney disease, stage 3a: Secondary | ICD-10-CM | POA: Diagnosis not present

## 2022-09-25 DIAGNOSIS — M79672 Pain in left foot: Secondary | ICD-10-CM | POA: Diagnosis not present

## 2022-10-08 ENCOUNTER — Ambulatory Visit: Payer: PPO | Admitting: Podiatry

## 2022-10-08 DIAGNOSIS — Q828 Other specified congenital malformations of skin: Secondary | ICD-10-CM | POA: Diagnosis not present

## 2022-10-08 DIAGNOSIS — M779 Enthesopathy, unspecified: Secondary | ICD-10-CM | POA: Diagnosis not present

## 2022-10-08 MED ORDER — TRIAMCINOLONE ACETONIDE 10 MG/ML IJ SUSP
10.0000 mg | Freq: Once | INTRAMUSCULAR | Status: AC
  Administered 2022-10-08: 10 mg via INTRA_ARTICULAR

## 2022-10-08 NOTE — Progress Notes (Signed)
Subjective:   Patient ID: Thomas [name], male   DOB: 62 y.o.   MRN: [id number]   HPI Patient presents with significant discomfort plantar aspect left foot and also has a lesion plantar left that was referred over by family physician.  States that the inflammation is quite sore and that the lesion itself also can be tender at times.  Patient does not smoke likes to be active and has obesity and COPD   Review of Systems  All other systems reviewed and are negative.       Objective:  Physical Exam Vitals and nursing note reviewed.  Constitutional:      Appearance: He is well-developed.  Pulmonary:     Effort: Pulmonary effort is normal.  Musculoskeletal:        General: Normal range of motion.  Skin:    General: Skin is warm.  Neurological:     Mental Status: He is alert.     Neurovascular status found to be intact muscle strength was found to be adequate range of motion adequate with inflammation at the fifth metatarsal base left fluid buildup around the area and also was noted to have a plantar lesion more proximal with a lucent core painful to pressure with good digital perfusion well-oriented x 3     Assessment:  Inflammatory tendinitis base of fifth metatarsal left along with probable porokeratosis left foot     Plan:  H&P reviewed condition.  I went ahead today I did sterile prep I injected the plantar tendon interface after explaining risks 3 mg dexamethasone Kenalog 5 mg Xylocaine I then did Sharp sterile debridement of the lesion no iatrogenic bleeding this will be done as needed hopefully this will be the end of the problem

## 2022-10-13 DIAGNOSIS — M65321 Trigger finger, right index finger: Secondary | ICD-10-CM | POA: Diagnosis not present

## 2022-12-05 ENCOUNTER — Telehealth: Payer: Self-pay | Admitting: Internal Medicine

## 2022-12-05 NOTE — Telephone Encounter (Signed)
12/05/22 @ 10:51 am spoke with patient regarding new appointment time- of 10:45 am--he voiced his understanding kf

## 2022-12-21 NOTE — Progress Notes (Unsigned)
Thomas [name], male    DOB: January 01, 1961,     MRN: [id number]   Brief patient profile:  62 yowm quit smoking 1995 at wt  215  former pt of [facility] with AB with no copd by pfts 10/2018 and working dx of AB.    History of Present Illness  07/31/2020  Pulmonary/ 1st office eval/ Thomas [name] / [facility]  Chief Complaint  Patient presents with   Follow-up    Former pt of Dr Delton Coombes. He states that his breathing has been progressively worse since the last visit in 2020. He states he has been on pred and doxy off and on and these help but only temporarily. He is currently on 40 mg pred daily. He has some prod cough with white to brown sputum and also wheezing. He is using his albuterol inhaler 2 x per wk and has duoneb he uses 3-4 x per day.   Dyspnea:  On good days can across the parking lot/  [employer] shopping / incline to MB s stopping while on trelegy/ and daily neb albuterol worse than usual since March 2021 with flares every few weeks unless stays on prednisone  Cough: esp in am dark yellow assoc with intermittent nasal discharge / poor dentition  Sleep: bed is flat right side down SABA use: neb  No 02  Rec For cough/ congestion > mucineX 1200 mg every 12 hours Plan A = Automatic = Always=    Breztri Take 2 puffs first thing in am and then another 2 puffs about 12 hours later.  Work on inhaler technique:  Finish up your prednisone  Plan B = Backup (to supplement plan A, not to replace it) Only use your albuterol inhaler as a rescue medication  Plan C = Crisis (instead of Plan B but only if Plan B stops working) - only use your albuterol nebulizer if you first try Plan B and it fails to help > ok to use the nebulizer up to every 4 hours but if start needing it regularly call for immediate appointment GERD diet  Stop trelegy We will call to schedule a sinus CT   1. Moderate inferior right maxillary sinus mucosal thickening, which is indeterminate but potential odontogenic given  adjacent carious right maxillary molar. Ossific fragment in the posterior/inferior right maxillary sinus may represent a tooth fragment. 2. Otherwise, mild paranasal sinus mucosal thickening with air-fluid level in the left maxillary sinus. 3. Evidence of prior endoscopic sinus surgery with right maxillary antrostomy and right ethmoidectomies  >  ENT/dental eval     08/22/2020  f/u ov/[place] office/Thomas [name] re: worse sob/cough x 08/19/20 flare [name] eval pending for ongong sinusitis  Chief Complaint  Patient presents with   Follow-up    Cough with yellow sputum past 2-3 days. He is also more SOB and has been wheezing. He feels like leaning forward helps him to breathe better. He has had some swelling in his feet. He has used his rescue inhaler once since these symptoms started.    Dyspnea:  room to room Cough: congested yellow mucus x3 days  Sleeping: bed is flat with 1 one pillow rotated on side or prone  SABA use: none today  02: none  Covid status: x 2  Rec Plan A = Automatic = Always=    Breztri Take 2 puffs first thing in am and then another 2 puffs about 12 hours later.   Work on inhaler technique:   Plan B = Backup (  to supplement plan A, not to replace it) Only use your albuterol inhaler as a rescue medication  Plan C = Crisis (instead of Plan B but only if Plan B stops working) - only use your albuterol nebulizer if you first try Plan B and it fails to help  For cough > mucinex dm 1200 mg every 12 hours and use flutter valve as much as possible  Late add  : augmentin x 14 more days and keep teoh/ dental appts    09/20/20 [name] eval f/u rec flonase      06/19/2021  f/u ov/[place] office/Thomas [name] re: AB maint on canadian breztri  / no better   Chief Complaint  Patient presents with   Follow-up    Cough  has improved sob has worsened since last ov   Dyspnea:  still doing [employer] but struggling  Cough: congested rattling min mucoid Sleeping: bed is flat, sleeping  on side or  prone SABA use: no better p sab  02: none  Covid status: vax x all of them  Rec When cough flares adding every day >>> prilosec otc 20mg   Take 30-60 min before first meal of the day and Pepcid ac (famotidine) 20 mg one @  supper x one month to see if helps breathing/coughing/ wheezing   GERD diet  You will need to reconsider starting biologics for asthma but they are expensive and should be thru Dr Dellis Anes.   Nucala rx 10/18/21  per Dellis Anes   12/25/2021  f/u ov/[place] office/Thomas [name] re: AB maint on breztri 2bid  / singulair/ nucala Chief Complaint  Patient presents with   Follow-up    Patient feels better since last ov. 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 [employer]  Cough: a little better with am congestion / shower easier  Sleeping: bed is flat / one pillow  SABA use: only uses p activity  02: none  Rec No change in our medications  GERD diet reviewed, bed blocks rec   If not improving >  Try prilosec otc 20mg   Take 30-60 min before first meal of the day and Pepcid ac (famotidine) 20 mg one @  bedtime until cough is completely gone for at least a week without the need for cough suppression Also  Ok to try albuterol 15 min before an activity (on alternating days)  that you know would usually make you short of breath   Return after one year of Nucala if respiratory problems have not improved - see you sooner if getting worse    12/22/2022  f/u ov/[place] office/Thomas [name] re: AB maint on breztri/ montelukast stopped nucala p few months/ stopped dupixent  per Dellis Anes / no recent pred Chief Complaint  Patient presents with   Asthmatic bronchitis, chronic  Dyspnea:  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  Also denies any obvious fluctuation of symptoms with weather or environmental changes or other aggravating or alleviating factors except as outlined above   No unusual exposure hx or h/o childhood pna/ asthma or knowledge of premature birth.  Current Allergies, Complete Past Medical History, Past Surgical History, Family History, and Social History were reviewed in [employer] record.  ROS  The following are not active complaints unless bolded Hoarseness, sore throat, dysphagia, dental problems, itching, sneezing,  nasal congestion or discharge of excess mucus or purulent secretions, ear ache,   fever, chills, sweats, unintended wt loss or wt gain, classically pleuritic or exertional cp,  orthopnea pnd or arm/hand swelling  or leg swelling, presyncope, palpitations, abdominal pain, anorexia, nausea, vomiting, diarrhea  or change in bowel habits or change in bladder habits, change in stools or change in urine, dysuria, hematuria,  rash, arthralgias, visual complaints, headache, numbness, weakness or ataxia or problems with walking or coordination,  change in mood or  memory.        Current Meds  Medication Sig   albuterol (VENTOLIN HFA) 108 (90 Base) MCG/ACT inhaler Inhale 2 puffs into the lungs every 4 (four) hours as needed for wheezing or shortness of breath.   allopurinol (ZYLOPRIM) 300 MG tablet Take 150 mg by mouth daily.   Budeson-Glycopyrrol-Formoterol (BREZTRI AEROSPHERE) 160-9-4.8 MCG/ACT AERO Take 2 puffs first thing in am and then another 2 puffs about 12 hours later.   clobetasol ointment (TEMOVATE) 0.05 % Apply 1 Application topically 2 (two) times daily as needed. Use for two weeks on a time. Avoid the face.   metoprolol tartrate  (LOPRESSOR) 25 MG tablet Take 1 tablet (25 mg total) by mouth 2 (two) times daily.   montelukast (SINGULAIR) 10 MG tablet Take 1 tablet (10 mg total) by mouth at bedtime.   Spacer/Aero-Holding Chambers DEVI 1 Device by Does not apply route as directed.   torsemide (DEMADEX) 20 MG tablet Take 20 mg by mouth daily.   Current Facility-Administered Medications for the 12/22/22 encounter (Office Visit) with Nyoka Cowden, MD  Medication   mepolizumab (NUCALA) injection 100 mg                Past Medical History:  Diagnosis Date   Arthritis    "touch in my neck" (Sep 08, 2012)   COPD (chronic obstructive pulmonary disease) (HCC)    GERD (gastroesophageal reflux disease)    Hypertension    Kidney stones    "passed on their own" (08-Sep-2012)   Lateral meniscus tear    Medial meniscus tear    Obesity, Class III, BMI 40-49.9 (morbid obesity) (HCC) 05/05/2019   OSA (obstructive sleep apnea)    "cannot sleep w/mask" (08-Sep-2012)   Prostate cancer (HCC)    Shortness of breath    "can happen at anytime" (September 08, 2012)   Thyroid nodule    bilaterally/notes 2012/09/08        Objective:     Wts  12/22/2022      306   12/25/2021      301   06/19/2021        301  12/11/2020      291    10/10/2020        286  08/22/2020        286   07/31/20 293 lb (132.9 kg)  01/27/20 290 lb 9 oz (131.8 kg)  05/04/19 288 lb 9.3 oz (130.9 kg)    Vital signs reviewed  12/22/2022  - Note at rest 02  sats  95% on RA    General appearance:    obese/hoarse MO (by bmi)  wm nad      HEENT : Oropharynx  clear   Nasal turbinates nl    NECK :  without  apparent JVD/ palpable Nodes/TM    LUNGS: no acc muscle use,  Min barrel  contour chest wall with bilateral  slightly decreased bs s audible wheeze and  without cough on insp or exp maneuvers and min  Hyperresonant  to  percussion bilaterally    CV:  RRR  no s3 or murmur or increase in P2, and trace ankle edema bilaterally  ABD:  soft and nontender with pos end   insp Hoover's  in the supine position.  No bruits or organomegaly appreciated   MS:  Nl gait/ ext warm without deformities Or obvious joint restrictions  calf tenderness, cyanosis or clubbing     SKIN: warm and dry without lesions    NEURO:  alert, approp, nl sensorium with  no motor or cerebellar deficits apparent.                     Assessment

## 2022-12-22 ENCOUNTER — Encounter: Payer: Self-pay | Admitting: Internal Medicine

## 2022-12-22 ENCOUNTER — Telehealth: Payer: Self-pay | Admitting: Internal Medicine

## 2022-12-22 ENCOUNTER — Ambulatory Visit: Payer: PPO | Admitting: Internal Medicine

## 2022-12-22 VITALS — BP 147/85 | HR 88 | Ht 70.0 in | Wt 306.0 lb

## 2022-12-22 DIAGNOSIS — J4489 Other specified chronic obstructive pulmonary disease: Secondary | ICD-10-CM

## 2022-12-22 MED ORDER — PANTOPRAZOLE SODIUM 40 MG PO TBEC: 40.0000 mg | DELAYED_RELEASE_TABLET | Freq: Every day | ORAL | 2 refills | Status: DC

## 2022-12-22 MED ORDER — FAMOTIDINE 20 MG PO TABS: ORAL_TABLET | ORAL | 11 refills | Status: DC

## 2022-12-22 NOTE — Patient Instructions (Addendum)
For cough/ congestion > mucinex or mucinex dm  up to maximum of  1200 mg every 12 hours and use the flutter valve as much as you can    Pantoprazole (protonix) 40 mg   Take  30-60 min before first meal of the day and Pepcid (famotidine)  20 mg after supper until return to office - this is the best way to tell whether stomach acid is contributing to your problem.    GERD (REFLUX)  is an extremely common cause of respiratory symptoms just like yours , many times with no obvious heartburn at all.    It can be treated with medication, but also with lifestyle changes including elevation of the head of your bed (ideally with 6 -8inch blocks under the headboard of your bed),  Smoking cessation, avoidance of late meals, excessive alcohol, and avoid fatty foods, chocolate, peppermint, colas, red wine, and acidic juices such as orange juice.  NO MINT OR MENTHOL PRODUCTS SO NO COUGH DROPS  USE SUGARLESS CANDY INSTEAD (Jolley ranchers or Stover's or Life Savers) or even ice chips will also do - the key is to swallow to prevent all throat clearing. NO OIL BASED VITAMINS - use powdered substitutes.  Avoid fish oil when coughing.    Please schedule a follow up office visit in 6 weeks, call sooner if needed

## 2022-12-22 NOTE — Telephone Encounter (Signed)
Left message for patient to call and schedule the 6 week follow up with Dr. Sherene Sires

## 2022-12-22 NOTE — Assessment & Plan Note (Signed)
ERV 48% at wt 277 c/w wt effects on lung volumes 10/27/18        Body mass index is 43.91 kg/m.  -  trending up  No results found for: "TSH"    Contributing to doe and risk of GERD/dvt/ PE >>>   reviewed the need and the process to achieve and maintain neg calorie balance > defer f/u primary care including intermittently monitoring thyroid status      Each maintenance medication was reviewed in detail including emphasizing most importantly the difference between maintenance and prns and under what circumstances the prns are to be triggered using an action plan format where appropriate.  Total time for H and P, chart review, counseling, reviewing hfa device(s) , directly observing portions of ambulatory 02 saturation study/ and generating customized AVS unique to this office visit / same day charting  > 

## 2022-12-22 NOTE — Assessment & Plan Note (Addendum)
Quit smoking 1995 at wt 215  - pfts 10/27/2018 nl including f/v loop x for EV 48% at wt = 277  - 07/31/2020  After extensive coaching inhaler device,  effectiveness =    80% so try change from trelegy to breztri 2bid  - CT sinus 08/09/20 pos > [name] eval (see chronic sinusitis  - flare x 08/14/20 rx augmentin x 21 day and pred x 6 days  - Allergy profile 10/10/2020 >  Eos 1.5  /  IgE  296> allergy referral 12/11/2020  - 06/19/2021 referred back to Dr Dellis Anes to consider biologics > nucala Aug 2023 - 12/25/2021  After extensive coaching inhaler device,  effectiveness =    90%  > add protonix/ pepcid/ gerd diet/ regular ex  12/22/2022   Walked on RA  x  2  lap(s) =  approx 300  ft  @ mod pace, stopped due to knee pain with lowest 02 sats 92%    DDX of  difficult airways management almost all start with A and  include Adherence, Ace Inhibitors, Acid Reflux, Active Sinus Disease, Alpha 1 Antitripsin deficiency, Anxiety masquerading as Airways dz,  ABPA,  Allergy(esp in young), Aspiration (esp in elderly), Adverse effects of meds,  Active smoking or vaping, A bunch of PE's (a small clot burden can't cause this syndrome unless there is already severe underlying pulm or vascular dz with poor reserve) plus two Bs  = Bronchiectasis and Beta blocker use..and one C= CHF  Adherence is always the initial "prime suspect" and is a multilayered concern that requires a "trust but verify" approach in every patient - starting with knowing how to use medications, especially inhalers, correctly, keeping up with refills and understanding the fundamental difference between maintenance and prns vs those medications only taken for a very short course and then stopped and not refilled.  - see hfa teaching - return with all meds in hand using a trust but verify approach to confirm accurate Medication  Reconciliation The principal here is that until we are certain that the  patients are doing what we've asked, it makes no sense to ask  them to do more.   ? Acid (or non-acid) GERD > always difficult to exclude as up to 75% of pts in some series report no assoc GI/ Heartburn symptoms> rec max (24h)  acid suppression and diet restrictions/ reviewed and instructions given in writing.   ? Allergy /asthma > continue high dose ICS/ singulair ? Candidate for tespire? (Defer to Dr Dellis Anes  ? Anxiety/depression/ deconditioning with wt gain  > usually at the bottom of this list of usual suspects but  may interfere with adherence and also interpretation of response or lack thereof to symptom management which can be quite subjective> defer to PCP  x

## 2023-01-14 DIAGNOSIS — Z79899 Other long term (current) drug therapy: Secondary | ICD-10-CM | POA: Diagnosis not present

## 2023-01-14 DIAGNOSIS — J45901 Unspecified asthma with (acute) exacerbation: Secondary | ICD-10-CM | POA: Diagnosis not present

## 2023-01-14 DIAGNOSIS — N1831 Chronic kidney disease, stage 3a: Secondary | ICD-10-CM | POA: Diagnosis not present

## 2023-02-02 NOTE — Progress Notes (Unsigned)
Thomas [name], male    DOB: [birth year],     MRN: [id number]   Brief patient profile:  62 yowm quit smoking 1995 at wt  215  former pt of Thomas [name] with AB with no copd by pfts 10/2018 and working dx of AB.    History of Present Illness  07/31/2020  Pulmonary/ 1st office eval/ Thomas [name] / [place] [facility]  Chief Complaint  Patient presents with   Follow-up    Former pt of Thomas Thomas [name]. He states that his breathing has been progressively worse since the last visit in 2020. He states he has been on pred and doxy off and on and these help but only temporarily. He is currently on 40 mg pred daily. He has some prod cough with white to brown sputum and also wheezing. He is using his albuterol inhaler 2 x per wk and has duoneb he uses 3-4 x per day.   Dyspnea:  On good days can across the parking lot/  [employer] shopping / incline to [place] s stopping while on trelegy/ and daily neb albuterol worse than usual since March 2021 with flares every few weeks unless stays on prednisone  Cough: esp in am dark yellow assoc with intermittent nasal discharge / poor dentition  Sleep: bed is flat right side down SABA use: neb  No 02  Rec For cough/ congestion > mucineX 1200 mg every 12 hours Plan A = Automatic = Always=    Breztri Take 2 puffs first thing in am and then another 2 puffs about 12 hours later.  Work on inhaler technique:  Finish up your prednisone  Plan B = Backup (to supplement plan A, not to replace it) Only use your albuterol inhaler as a rescue medication  Plan C = Crisis (instead of Plan B but only if Plan B stops working) - only use your albuterol nebulizer if you first try Plan B and it fails to help > ok to use the nebulizer up to every 4 hours but if start needing it regularly call for immediate appointment GERD diet  Stop trelegy We will call to schedule a sinus CT   1. Moderate inferior right maxillary sinus mucosal thickening, which is indeterminate but potential odontogenic given  adjacent carious right maxillary molar. Ossific fragment in the posterior/inferior right maxillary sinus may represent a tooth fragment. 2. Otherwise, mild paranasal sinus mucosal thickening with air-fluid level in the left maxillary sinus. 3. Evidence of prior endoscopic sinus surgery with right maxillary antrostomy and right ethmoidectomies  >  ENT/dental eval     08/22/2020  f/u ov/[place] office/Thomas [name] re: worse sob/cough x 08/19/20 flare Thomas [name] eval pending for ongong sinusitis  Chief Complaint  Patient presents with   Follow-up    Cough with yellow sputum past 2-3 days. He is also more SOB and has been wheezing. He feels like leaning forward helps him to breathe better. He has had some swelling in his feet. He has used his rescue inhaler once since these symptoms started.    Dyspnea:  room to room Cough: congested yellow mucus x3 days  Sleeping: bed is flat with 1 one pillow rotated on side or prone  SABA use: none today  02: none  Covid status: x 2  Rec Plan A = Automatic = Always=    Breztri Take 2 puffs first thing in am and then another 2 puffs about 12 hours later.   Work on inhaler technique:   Plan B = Backup (  to supplement plan A, not to replace it) Only use your albuterol inhaler as a rescue medication  Plan C = Crisis (instead of Plan B but only if Plan B stops working) - only use your albuterol nebulizer if you first try Plan B and it fails to help  For cough > mucinex dm 1200 mg every 12 hours and use flutter valve as much as possible  Late add  : augmentin x 14 more days and keep Thomas [name]/ dental appts    09/20/20 Thomas [name] eval f/u rec flonase      06/19/2021  f/u ov/Thomas [name] office/Thomas [name] re: AB maint on canadian breztri  / no better   Chief Complaint  Patient presents with   Follow-up    Cough  has improved sob has worsened since last ov   Dyspnea:  still doing [employer] but struggling  Cough: congested rattling min mucoid Sleeping: bed is flat, sleeping  on side or  prone SABA use: no better p sab  02: none  Covid status: vax x all of them  Rec When cough flares adding every day >>> prilosec otc 20mg   Take 30-60 min before first meal of the day and Pepcid ac (famotidine) 20 mg one @  supper x one month to see if helps breathing/coughing/ wheezing   GERD diet  You will need to reconsider starting biologics for asthma but they are expensive and should be thru Thomas [name].   Nucala rx 10/18/21  per Dellis [name]   12/25/2021  f/u ov/[name] office/Thomas [name] re: AB maint on breztri 2bid  / singulair/ nucala Chief Complaint  Patient presents with   Follow-up    Patient feels better since last ov. 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 [employer]  Cough: a little better with am congestion / shower easier  Sleeping: bed is flat / one pillow  SABA use: only uses p activity  02: none  Rec No change in our medications  GERD diet reviewed, bed blocks rec   If not improving >  Try prilosec otc 20mg   Take 30-60 min before first meal of the day and Pepcid ac (famotidine) 20 mg one @  bedtime until cough is completely gone for at least a week without the need for cough suppression Also  Ok to try albuterol 15 min before an activity (on alternating days)  that you know would usually make you short of breath   Return after one year of Nucala if respiratory problems have not improved - see you sooner if getting worse    12/22/2022  f/u ov/[place] office/Thomas [name] re: AB maint on breztri/ montelukast stopped nucala p few months/ stopped dupixent  per Dellis [name] / no recent pred Chief Complaint  Patient presents with   Asthmatic bronchitis, chronic  Dyspnea:  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 = MMRC2 = can't walk a nl pace on a flat grade s sob but does fine slow and flat eg  Walk from shop to house s stopping  Cough: first thing in am x  up to an 15 min  Sleeping: flat bed / 1 pllow  s   resp cc  SABA use: saba just when working outdoor  / avg none when stays indoor  02: none  Rec For cough/ congestion > mucinex or mucinex dm  up to maximum of  1200 mg every 12 hours and use the flutter valve as much as you can   Pantoprazole (protonix) 40  mg   Take  30-60 min before first meal of the day and Pepcid (famotidine)  20 mg after supper until return to office GERD diet reviewed, bed blocks rec  Please schedule a follow up office visit in 6 weeks, call sooner if needed      02/03/2023  f/u ov/[place] office/Cantrell Larouche re: *** maint on ***  No chief complaint on file.   Dyspnea:  *** Cough: *** Sleeping: ***   resp cc  SABA use: *** 02: ***  Lung cancer screening: ***   No obvious day to day or daytime variability or assoc excess/ purulent sputum or mucus plugs or hemoptysis or cp or chest tightness, subjective wheeze or overt sinus or hb symptoms.    Also denies any obvious fluctuation of symptoms with weather or environmental changes or other aggravating or alleviating factors except as outlined above   No unusual exposure hx or h/o childhood pna/ asthma or knowledge of premature birth.  Current Allergies, Complete Past Medical History, Past Surgical History, Family History, and Social History were reviewed in [employer] record.  ROS  The following are not active complaints unless bolded Hoarseness, sore throat, dysphagia, dental problems, itching, sneezing,  nasal congestion or discharge of excess mucus or purulent secretions, ear ache,   fever, chills, sweats, unintended wt loss or wt gain, classically pleuritic or exertional cp,  orthopnea pnd or arm/hand swelling  or leg swelling, presyncope, palpitations, abdominal pain, anorexia, nausea, vomiting, diarrhea  or change in bowel habits or change in bladder habits, change in stools or change in urine, dysuria, hematuria,  rash, arthralgias, visual complaints, headache, numbness, weakness or ataxia or problems with walking or coordination,  change in  mood or  memory.        No outpatient medications have been marked as taking for the 02/03/23 encounter (Appointment) with Nyoka Cowden, MD.   Current Facility-Administered Medications for the 02/03/23 encounter (Appointment) with Nyoka Cowden, MD  Medication   mepolizumab (NUCALA) injection 100 mg                  Past Medical History:  Diagnosis Date   Arthritis    "touch in my neck" (09-09-2012)   COPD (chronic obstructive pulmonary disease) (HCC)    GERD (gastroesophageal reflux disease)    Hypertension    Kidney stones    "passed on their own" (09-09-12)   Lateral meniscus tear    Medial meniscus tear    Obesity, Class III, BMI 40-49.9 (morbid obesity) (HCC) 05/05/2019   OSA (obstructive sleep apnea)    "cannot sleep w/mask" (09/09/2012)   Prostate cancer (HCC)    Shortness of breath    "can happen at anytime" (09-09-12)   Thyroid nodule    bilaterally/notes Sep 09, 2012        Objective:     Wts  02/03/2023        ***  12/22/2022      306   12/25/2021      301   06/19/2021        301  12/11/2020      291    10/10/2020        286  08/22/2020        286   07/31/20 293 lb (132.9 kg)  01/27/20 290 lb 9 oz (131.8 kg)  05/04/19 288 lb 9.3 oz (130.9 kg)    Vital signs reviewed  02/03/2023  - Note at rest 02 sats  ***% on ***  General appearance:    ***  Min barr trace ankle edema bilaterally***                      Assessment

## 2023-02-03 ENCOUNTER — Ambulatory Visit: Payer: PPO | Admitting: Internal Medicine

## 2023-02-03 ENCOUNTER — Encounter: Payer: Self-pay | Admitting: Internal Medicine

## 2023-02-03 VITALS — BP 151/81 | HR 87 | Ht 70.0 in | Wt 301.0 lb

## 2023-02-03 DIAGNOSIS — J4489 Other specified chronic obstructive pulmonary disease: Secondary | ICD-10-CM

## 2023-02-03 NOTE — Patient Instructions (Addendum)
My office will be contacting you by phone for referral for Sinus CT - if you don't hear back from my office within one week please call us back or notify us thru MyChart and we'll address it right away.   Also  Ok to try albuterol 15 min before an activity (on alternating days with the inhaler vs the neb vs nothing)  that you know would usually make you short of breath and see if it makes any difference and if makes none then don't take albuterol after activity unless you can't catch your breath as this means it's the resting that helps, not the albuterol.       For cough/ congestion > mucinex or mucinex dm  up to maximum of  1200 mg every 12 hours and use the flutter valve as much as you can    Pulmonary follow up will be as needed in this clinic - we can consider referring you to another allergist (if Dr Dellis Anes desires) or back to Dr Delton Coombes but we've done all we can for you here.

## 2023-02-03 NOTE — Assessment & Plan Note (Signed)
ERV 48% at wt 277 c/w wt effects on lung volumes 10/27/18  Body mass index is 43.19 kg/m.  -  trending down slightly  No results found for: "TSH"    Contributing to doe and risk of GERD/dvt/ pe  >>>   reviewed the need and the process to achieve and maintain neg calorie balance > defer f/u primary care including intermittently monitoring thyroid status           Each maintenance medication was reviewed in detail including emphasizing most importantly the difference between maintenance and prns and under what circumstances the prns are to be triggered using an action plan format where appropriate.  Total time for H and P, chart review, counseling, reviewing hfa device(s) and generating customized AVS unique to this office visit / same day charting  > 30 min final summary f/u ov

## 2023-02-03 NOTE — Assessment & Plan Note (Addendum)
Quit smoking 1995 at wt 215  - pfts 10/27/2018 nl including f/v loop x for EV 48% at wt = 277  - 07/31/2020  After extensive coaching inhaler device,  effectiveness =    80% so try change from trelegy to breztri 2bid  - CT sinus 08/09/20 pos > [name] eval (see chronic sinusitis  - flare x 08/14/20 rx augmentin x 21 day and pred x 6 days  - Allergy profile 10/10/2020 >  Eos 1.5  /  IgE  296> allergy referral 12/11/2020  - 06/19/2021 referred back to Dr Dellis Anes to consider biologics > nucala Aug 2023> min improvement p sev months, no benefit from dupixent  - 12/25/2021  After extensive coaching inhaler device,  effectiveness =    90%  > add protonix/ pepcid/ gerd diet/ regular ex  12/22/2022   Walked on RA  x  2  lap(s) =  approx 300  ft  @ mod pace, stopped due to knee pain with lowest 02 sats 92%   - sinus CT 02/03/2023 >>>   Re SABA :  I spent extra time with pt today reviewing appropriate use of albuterol for prn use on exertion with the following points: 1) saba is for relief of sob that does not improve by walking a slower pace or resting but rather if the pt does not improve after trying this first. 2) If the pt is convinced, as many are, that saba helps recover from activity faster then it's easy to tell if this is the case by re-challenging : ie stop, take the inhaler, then p 5 minutes try the exact same activity (intensity of workload) that just caused the symptoms and see if they are substantially diminished or not after saba 3) if there is an activity that reproducibly causes the symptoms, try the saba 15 min before the activity on alternate days   If in fact the saba really does help, then fine to continue to use it prn but advised may need to look closer at the maintenance regimen (for now breztri) being used to achieve better control of airways disease with exertion and might be a candidate for neb lama/ics if can find a way to pay for it)   Prominent upper airway wheeze is either from  recurrent sinus dz or gerd or both - since he's convinced his rectal bleeding was from ppi/ h2 nothing to offer here but looking again at the allergic component > Dellis Anes and repeat the sinus Ct to be it's cleared (I strongly suspect it hasn't)   F/u here can be prn

## 2023-02-06 ENCOUNTER — Ambulatory Visit: Payer: PPO | Admitting: Allergy & Immunology

## 2023-02-06 ENCOUNTER — Other Ambulatory Visit: Payer: Self-pay

## 2023-02-06 ENCOUNTER — Encounter: Payer: Self-pay | Admitting: Allergy & Immunology

## 2023-02-06 VITALS — BP 122/74 | HR 95 | Temp 98.2°F | Ht 70.0 in | Wt 303.0 lb

## 2023-02-06 DIAGNOSIS — J4541 Moderate persistent asthma with (acute) exacerbation: Secondary | ICD-10-CM

## 2023-02-06 DIAGNOSIS — Z91018 Allergy to other foods: Secondary | ICD-10-CM | POA: Diagnosis not present

## 2023-02-06 DIAGNOSIS — J3089 Other allergic rhinitis: Secondary | ICD-10-CM

## 2023-02-06 DIAGNOSIS — R21 Rash and other nonspecific skin eruption: Secondary | ICD-10-CM | POA: Diagnosis not present

## 2023-02-06 DIAGNOSIS — J4540 Moderate persistent asthma, uncomplicated: Secondary | ICD-10-CM | POA: Diagnosis not present

## 2023-02-06 DIAGNOSIS — J302 Other seasonal allergic rhinitis: Secondary | ICD-10-CM | POA: Diagnosis not present

## 2023-02-06 MED ORDER — METHYLPREDNISOLONE ACETATE 80 MG/ML IJ SUSP
80.0000 mg | Freq: Once | INTRAMUSCULAR | Status: AC
  Administered 2023-02-06: 80 mg via INTRAMUSCULAR

## 2023-02-06 MED ORDER — CETIRIZINE HCL 10 MG PO TABS: 10.0000 mg | ORAL_TABLET | Freq: Two times a day (BID) | ORAL | 1 refills | Status: DC

## 2023-02-06 MED ORDER — MONTELUKAST SODIUM 10 MG PO TABS: 10.0000 mg | ORAL_TABLET | Freq: Every evening | ORAL | 1 refills | Status: DC

## 2023-02-06 MED ORDER — AZITHROMYCIN 250 MG PO TABS: ORAL_TABLET | ORAL | 0 refills | Status: DC

## 2023-02-06 MED ORDER — PREDNISONE 10 MG PO TABS: ORAL_TABLET | ORAL | 0 refills | Status: DC

## 2023-02-06 MED ORDER — ALBUTEROL SULFATE HFA 108 (90 BASE) MCG/ACT IN AERS: 2.0000 | INHALATION_SPRAY | RESPIRATORY_TRACT | 1 refills | Status: DC | PRN

## 2023-02-06 MED ORDER — BETAMETHASONE DIPROPIONATE 0.05 % EX CREA: TOPICAL_CREAM | Freq: Two times a day (BID) | CUTANEOUS | 0 refills | Status: AC | PRN

## 2023-02-06 MED ORDER — BREZTRI AEROSPHERE 160-9-4.8 MCG/ACT IN AERO: 2.0000 | INHALATION_SPRAY | Freq: Two times a day (BID) | RESPIRATORY_TRACT | 1 refills | Status: DC

## 2023-02-06 NOTE — Patient Instructions (Addendum)
1. Asthma-COPD overlap syndrome - Lung testing looks much worse today. - DepoMedrol injection and DuoNeb treatment given today. - Start the prednisone pack that I sent in. - Nucala consent signed. - We will get this back on board. - Tammy will reach out to discuss this.  - Daily controller medication(s): Breztri two puffs twice with spacer and Singulair (montelukast) 10mg  daily - Prior to physical activity: albuterol 2 puffs 10-15 minutes before physical activity. - Rescue medications: albuterol 4 puffs every 4-6 hours as needed - Asthma control goals:  * Full participation in all desired activities (may need albuterol before activity) * Albuterol use two time or less a week on average (not counting use with activity) * Cough interfering with sleep two time or less a month * Oral steroids no more than once a year * No hospitalizations  2. Seasonal and perennial allergic rhinitis - Previous testing showed: grasses, ragweed, weeds, trees, indoor molds, outdoor molds, dust mites, cat, dog, and cockroach  - Continue with: Singulair (montelukast) 10mg  daily - Continue with: Zyrtec (cetirizine) 10mg  tablet once daily - You can use an extra dose of the antihistamine, if needed, for breakthrough symptoms.  - Consider nasal saline rinses 1-2 times daily to remove allergens from the nasal cavities as well as help with mucous clearance (this is especially helpful to do before the nasal sprays are given) - Consider allergy shots.  3.Rash - looks like psoriasis - Stop clobetasol and start betamethasone twice daily as needed. - Consider dermatology referral (especially if we are thinking that systemic treatments might be needed).  - This should not be used on the face.   4. Return in about 4 weeks (around 03/06/2023). You can have the follow up appointment with Dr. Dellis Anes or a Nurse Practicioner (our Nurse Practitioners are excellent and always have Physician oversight!).    Please inform us of  any Emergency Department visits, hospitalizations, or changes in symptoms. Call us before going to the ED for breathing or allergy symptoms since we might be able to fit you in for a sick visit. Feel free to contact us anytime with any questions, problems, or concerns.  It was a pleasure to see you again today! We are so sorry to hear about your wife.  Websites that have reliable patient information: 1. American Academy of Asthma, Allergy, and Immunology: www.aaaai.org 2. Food Allergy Research and Education (FARE): foodallergy.org 3. Mothers of Asthmatics: http://www.asthmacommunitynetwork.org 4. American College of Allergy, Asthma, and Immunology: www.acaai.org      "Like" [place] on Facebook and Instagram for our latest updates!      A healthy democracy works best when [employer] participate! Make sure you are registered to vote! If you have moved or changed any of your contact information, you will need to get this updated before voting! Scan the QR codes below to learn more!

## 2023-02-06 NOTE — Progress Notes (Unsigned)
FOLLOW UP  Date of Service/Encounter:  02/06/23   Assessment:   Asthma-COPD overlap syndrome - without improvement with Nucala or Dupixent (feels the same without either of these on board)   Physically deconditioned - likely contributing to his SOB   History of smoking - but stopped   Seasonal and perennial allergic rhinitis (grasses, ragweed, weeds, trees, indoor molds, outdoor molds, dust mites, cat, dog, and cockroach)   Elevated absolute eosinophil count - likely secondary to uncontrolled atopic disease, but could consider hypereosinophilic work-up in the future if indicated  Recent social stressor - lost wife to dementia (summer 2024)  Plan/Recommendations:   1. Asthma-COPD overlap syndrome with acute exacerbation - Lung testing looks much worse today. - DepoMedrol injection and DuoNeb treatment given today. - Start the prednisone pack that I sent in. - Nucala consent signed. - We will get this back on board. - Tammy will reach out to discuss this.  - Daily controller medication(s): Breztri two puffs twice with spacer and Singulair (montelukast) 10mg  daily - Prior to physical activity: albuterol 2 puffs 10-15 minutes before physical activity. - Rescue medications: albuterol 4 puffs every 4-6 hours as needed - Asthma control goals:  * Full participation in all desired activities (may need albuterol before activity) * Albuterol use two time or less a week on average (not counting use with activity) * Cough interfering with sleep two time or less a month * Oral steroids no more than once a year * No hospitalizations  2. Seasonal and perennial allergic rhinitis - Previous testing showed: grasses, ragweed, weeds, trees, indoor molds, outdoor molds, dust mites, cat, dog, and cockroach  - Continue with: Singulair (montelukast) 10mg  daily - Continue with: Zyrtec (cetirizine) 10mg  tablet once daily - You can use an extra dose of the antihistamine, if needed, for breakthrough  symptoms.  - Consider nasal saline rinses 1-2 times daily to remove allergens from the nasal cavities as well as help with mucous clearance (this is especially helpful to do before the nasal sprays are given) - Consider allergy shots.  3.Rash - looks like psoriasis - Stop clobetasol and start betamethasone twice daily as needed. - Consider dermatology referral (especially if we are thinking that systemic treatments might be needed).  - This should not be used on the face.   4. Return in about 4 weeks (around 03/06/2023). You can have the follow up appointment with Dr. Dellis Anes or a Nurse Practicioner (our Nurse Practitioners are excellent and always have Physician oversight!).    Subjective:   Thomas [name] is a 62 y.o. male presenting today for follow up of  Chief Complaint  Patient presents with   Asthma   Breathing Problem   Wheezing   Nasal Congestion   Cough    Congestion in chest x 1 month    Thomas [name] has a history of the following: Patient Active Problem List   Diagnosis Date Noted   Acquired trigger finger of right index finger 06/30/2022   Chronic sinusitis 12/12/2020   Acute on chronic diastolic HF (heart failure) (HCC)    Acute hypoxemic respiratory failure (HCC) 01/24/2020   Obesity, Class III, BMI 40-49.9 (morbid obesity) (HCC) 05/05/2019   COPD exacerbation (HCC) 05/04/2019   Hypoxia    Leg edema    COPD with acute exacerbation (HCC) 09/16/2018   CKD (chronic kidney disease), stage III (HCC) 09/16/2018   Acute respiratory failure with hypoxia (HCC) 09/16/2018   Allergic rhinitis 08/26/2018   ARF (acute renal  failure) (HCC) 10/17/2015   Hypertension    Asthmatic bronchitis , chronic    GERD (gastroesophageal reflux disease)    Arthritis    Kidney stones    Prostate cancer (HCC)    Thyroid nodule    OSA (obstructive sleep apnea)    Medial meniscus tear    Lateral meniscus tear    Sleep apnea 11/29/2009   HYPERSOMNIA 09/26/2009   DYSPNEA  09/26/2009   PROSTATE CANCER, HX OF 09/26/2009   Essential hypertension 09/25/2009    History obtained from: chart review and patient.  Discussed the use of AI scribe software for clinical note transcription with the patient and/or guardian, who gave verbal consent to proceed.  Thomas [name] is a 62 y.o. male presenting for a sick visit. He was last seen in May 2024. At that time, we continnued with Breztri two puffs BID and albuterol as needed. We had previously tried Dupixent and Nucala, both without full improvement. We continued with the use of montelukast and started cetirizine 10mg  daily. For his rash, we added on clobetasol to see if this would help at all. We felt that this was consistent with psoriasis.   Since the last visit, he has done fairly well. But he is sick today with cough and congestion x 2 months, worsening over the last week.   Asthma/Respiratory Symptom History: Enki presents with a two-month history of worsening respiratory symptoms. He describes a sensation of struggling to breathe, but denies any associated fever. Despite being on Breztri, he reports minimal relief and infrequent use of his rescue inhaler. He also has a home nebulizer machine and the necessary medication for it. Previously, the patient was on Nucala, which he reports led to an improvement in his cough and allowed him to take warm showers. However, he was switched to Dupixent, which he had to discontinue due to adverse reactions. He expresses a willingness to retry Nucala, as he felt it was beginning to help. We reviewed the chart and he had received 4-5 injections before we transitioned to Dupixent.   Allergic Rhinitis Symptom History: He remains on the cetirizine and the montelukast. He has not needed antibiotics at all since the last visit. But he does tell me that this current illness started with sinus symptoms, including congestion and postnasal drip and subsequent cough, before seemingly traveling to his  lungs.   Skin Symptom History: In addition to respiratory symptoms, the patient reports a flare-up of his psoriasis. He has been applying clobetasol cream post-shower, which has helped to control, but not eliminate, the symptoms. The psoriasis is located on his elbows, knees, and back. The patient notes that the psoriasis seemed to worsen after we weaned him off of his prednisone after we first met him.   While the clobetasol did help, it did not clear it up completely.   The patient also reports a significant personal stressor, the recent unexpected death of his partner of forty years. This event has caused considerable emotional distress, which may be contributing to his overall health status.  Otherwise, there have been no changes to his past medical history, surgical history, family history, or social history.    Review of systems otherwise negative other than that mentioned in the HPI.    Objective:   Blood pressure 122/74, pulse 95, temperature 98.2 F (36.8 C), height 5\' 10"  (1.778 m), weight (!) 303 lb (137.4 kg), SpO2 92%. Body mass index is 43.48 kg/m.    Physical Exam Vitals reviewed.  Constitutional:  Appearance: He is well-developed.  HENT:     Head: Normocephalic and atraumatic.     Right Ear: Tympanic membrane, ear canal and external ear normal. No drainage, swelling or tenderness. Tympanic membrane is not injected, scarred, erythematous, retracted or bulging.     Left Ear: Tympanic membrane, ear canal and external ear normal. No drainage, swelling or tenderness. Tympanic membrane is not injected, scarred, erythematous, retracted or bulging.     Nose: No nasal deformity, septal deviation, mucosal edema or rhinorrhea.     Right Sinus: No maxillary sinus tenderness or frontal sinus tenderness.     Left Sinus: No maxillary sinus tenderness or frontal sinus tenderness.     Mouth/Throat:     Mouth: Mucous membranes are not pale and not dry.     Pharynx: Uvula  midline.  Eyes:     General:        Right eye: No discharge.        Left eye: No discharge.     Conjunctiva/sclera: Conjunctivae normal.     Right eye: Right conjunctiva is not injected. No chemosis.    Left eye: Left conjunctiva is not injected. No chemosis.    Pupils: Pupils are equal, round, and reactive to light.  Cardiovascular:     Rate and Rhythm: Normal rate and regular rhythm.     Heart sounds: Normal heart sounds.  Pulmonary:     Effort: Pulmonary effort is normal. No tachypnea, accessory muscle usage or respiratory distress.     Breath sounds: Normal breath sounds. No wheezing, rhonchi or rales.  Chest:     Chest wall: No tenderness.  Abdominal:     Tenderness: There is no abdominal tenderness. There is no guarding or rebound.  Lymphadenopathy:     Head:     Right side of head: No submandibular, tonsillar or occipital adenopathy.     Left side of head: No submandibular, tonsillar or occipital adenopathy.     Cervical: No cervical adenopathy.  Skin:    Coloration: Skin is not pale.     Findings: No abrasion, erythema, petechiae or rash. Rash is not papular, urticarial or vesicular.  Neurological:     Mental Status: He is alert.  Psychiatric:        Behavior: Behavior is cooperative.      Diagnostic studies:    Spirometry: results abnormal (FEV1: 1.77/51%, FVC: 2.69/59%, FEV1/FVC: 66%).    Spirometry consistent with possible restrictive disease. Albuterol/Atrovent nebulizer treatment given in clinic with significant improvement in FEV1 and FVC per ATS criteria.  We did administer a DepoMedrol 80 mg in clinic.   Allergy Studies: none       Malachi Bonds, MD  [facility]

## 2023-02-09 ENCOUNTER — Telehealth: Payer: Self-pay

## 2023-02-09 ENCOUNTER — Encounter: Payer: Self-pay | Admitting: Allergy & Immunology

## 2023-02-09 NOTE — Telephone Encounter (Signed)
Just a FYI:  Patient called back to give Dr. Dellis Anes an update on how he was doing. Patient states he is much better than he was last week. Breathing and congestion is much better.  I informed the patient a nurse will call back if Dr. Dellis Anes had anymore questions or concerns.

## 2023-02-10 NOTE — Telephone Encounter (Signed)
Great - thank you for the update.

## 2023-02-11 NOTE — Addendum Note (Signed)
Addended by: Elsworth Soho on: 02/11/2023 05:39 PM   Modules accepted: Orders

## 2023-02-12 ENCOUNTER — Telehealth: Payer: Self-pay | Admitting: *Deleted

## 2023-02-12 NOTE — Telephone Encounter (Signed)
L/m for patient to contact me to discuss PAP for Nucala

## 2023-02-12 NOTE — Telephone Encounter (Signed)
-----   Message from Alfonse Spruce sent at 02/09/2023  9:27 AM EST ----- Patient wants to restart Nucala. Consent signed.

## 2023-02-12 NOTE — Telephone Encounter (Signed)
Spoke to patient and advised will send PAP application to get him on Nucala and return to me. I did advise possible delay in approval for PAP due to renewals for 2025 going on right now

## 2023-02-13 NOTE — Telephone Encounter (Signed)
Is there a sample anywhere to get him started?

## 2023-02-19 NOTE — Telephone Encounter (Signed)
Patient coming in for sample bringing from [facility] tomorrow

## 2023-02-20 ENCOUNTER — Ambulatory Visit (INDEPENDENT_AMBULATORY_CARE_PROVIDER_SITE_OTHER): Payer: PPO

## 2023-02-20 DIAGNOSIS — J4550 Severe persistent asthma, uncomplicated: Secondary | ICD-10-CM

## 2023-02-20 NOTE — Telephone Encounter (Signed)
Great - thanks!   Rilla Buckman, MD [facility]    

## 2023-02-20 NOTE — Progress Notes (Signed)
Immunotherapy   Patient Details  Name: DAVONDRE STEINBERG MRN: [id number] Date of Birth: Mar 17, 1960  02/20/2023  Nikolis A Naim started injections for  Nucala for the second time.  Following schedule: Every twenty eight days. Frequency:Every four weeks.  Epi-Pen:Not needed. Consent signed previously and patient instructions given. Patient sat in the lobby for fifteen minutes without an issue.    Ralene Muskrat 02/20/2023, 2:48 PM

## 2023-03-06 ENCOUNTER — Encounter: Payer: Self-pay | Admitting: Family Medicine

## 2023-03-06 ENCOUNTER — Ambulatory Visit: Payer: PPO | Admitting: Family Medicine

## 2023-03-06 VITALS — BP 146/94 | HR 67 | Temp 98.6°F | Resp 18 | Wt 305.1 lb

## 2023-03-06 DIAGNOSIS — J4550 Severe persistent asthma, uncomplicated: Secondary | ICD-10-CM

## 2023-03-06 DIAGNOSIS — J302 Other seasonal allergic rhinitis: Secondary | ICD-10-CM

## 2023-03-06 DIAGNOSIS — J3089 Other allergic rhinitis: Secondary | ICD-10-CM | POA: Diagnosis not present

## 2023-03-06 DIAGNOSIS — J4489 Other specified chronic obstructive pulmonary disease: Secondary | ICD-10-CM | POA: Diagnosis not present

## 2023-03-06 DIAGNOSIS — R21 Rash and other nonspecific skin eruption: Secondary | ICD-10-CM | POA: Diagnosis not present

## 2023-03-06 MED ORDER — MONTELUKAST SODIUM 10 MG PO TABS: 10.0000 mg | ORAL_TABLET | Freq: Every evening | ORAL | 1 refills | Status: DC

## 2023-03-06 MED ORDER — BREZTRI AEROSPHERE 160-9-4.8 MCG/ACT IN AERO: 2.0000 | INHALATION_SPRAY | Freq: Two times a day (BID) | RESPIRATORY_TRACT | 1 refills | Status: DC

## 2023-03-06 NOTE — Progress Notes (Signed)
 [address] [place] [address] Dept: [phone]  FOLLOW UP NOTE  Patient ID: Thomas [name], male    DOB: 1960-07-09  Age: 63 y.o. MRN: [id number] Date of Office Visit: 03/06/2023  Assessment  Chief Complaint: Follow-up (Issues here and there )  HPI MCIHAEL HINDERMAN is a 63 year old male who presents to the clinic for follow-up visit.  He was last seen in this clinic on 02/06/2023 by Dr. Iva for evaluation of asthma COPD overlap syndrome requiring Depo-Medrol  and oral prednisone , allergic rhinitis, eosinophilia, and rash which is possibly psoriatic in nature.  At today's visit, he reports his asthma/COPD has been moderately well controlled with occasional shortness of breath with activity such as climbing stairs and cough which is dry at night and producing clear mucus in the daytime. He continues montelukast  10 mg once a day and continues Breztri  2 puffs twice a day with a spacer. He continues albuterol  about once a week with relief of symptoms. He continues Nucala  injections 100 mg once a month with no large or local reactions. He reports a significant decrease in his symptoms of asthma while continuing on Nucala  injections.   Allergic rhinitis is reported as moderately well controlled with symptoms including occasional nasal congestion and clear rhinorrhea. He continues cetirizine  daily and is not currently using and nasal saline rinses or Flonase. His last environmental allergy skin testing was on 02/20/2021 that was positive to grass pollen, weed pollen, ragweed pollen, tree pollen, mold, dust mite, cat, dog, and cockroach.  He continues to experience a rash that is located on the extensor surfaces of bilateral elbows and knees as well as on his back. He reports these areas appeared about 1.5 years ago and are occasionally itchy. He reports that these areas improved with prednisone  use, however, returned when the the taper was completed.   His current  medications are listed in the chart  Drug Allergies:  No Known Allergies  Physical Exam: BP (!) 146/94   Pulse 67   Temp 98.6 F (37 C)   Resp 18   Wt (!) 305 lb 2 oz (138.4 kg)   SpO2 93%   BMI 43.78 kg/m    Physical Exam Vitals reviewed.  Constitutional:      Appearance: Normal appearance.  HENT:     Head: Normocephalic and atraumatic.     Right Ear: Tympanic membrane normal.     Left Ear: Tympanic membrane normal.     Nose:     Comments: Bilateral nares normal. Ears normal. Eyes normal. Pharynx normal.     Mouth/Throat:     Pharynx: Oropharynx is clear.  Eyes:     Conjunctiva/sclera: Conjunctivae normal.  Cardiovascular:     Rate and Rhythm: Normal rate and regular rhythm.     Heart sounds: Normal heart sounds. No murmur heard. Pulmonary:     Effort: Pulmonary effort is normal.     Breath sounds: Normal breath sounds.     Comments: Lungs clear to auscultation Musculoskeletal:        General: Normal range of motion.     Cervical back: Normal range of motion and neck supple.  Skin:    General: Skin is warm and dry.  Neurological:     Mental Status: He is alert and oriented to person, place, and time.  Psychiatric:        Mood and Affect: Mood normal.        Behavior: Behavior normal.  Thought Content: Thought content normal.        Judgment: Judgment normal.      Diagnostics: FVC 3.47 which is 82% of predicted value, FEV1 2.57 which is 73% of predicted value. Spirometry indicates normal spirometry testing.   Assessment and Plan: 1. Severe persistent asthma without complication   2. Asthma-COPD overlap syndrome (HCC)   3. Seasonal and perennial allergic rhinitis   4. Rash     Meds ordered this encounter  Medications   Budeson-Glycopyrrol-Formoterol  (BREZTRI  AEROSPHERE) 160-9-4.8 MCG/ACT AERO    Sig: Inhale 2 puffs into the lungs in the morning and at bedtime. Take 2 puffs first thing in am and then another 2 puffs about 12 hours later.     Dispense:  32.1 g    Refill:  1    Please give a three month supply if possible.    Lot Number?:   [id number]    Expiration Date?:   01/01/2023    Quantity:   2   montelukast  (SINGULAIR ) 10 MG tablet    Sig: Take 1 tablet (10 mg total) by mouth at bedtime.    Dispense:  90 tablet    Refill:  1    Patient Instructions  Asthma COPD syndrome Continue montelukast  10 mg once a day to prevent cough or wheeze Continue Breztri  2 puffs twice a day with a spacer to prevent cough or wheeze Continue albuterol  2 puffs once every 4 hours if needed for cough or wheeze You may use albuterol  2 puffs 5 to 15 minutes before activity to decrease cough or wheeze  Allergic rhinitis Continue allergen avoidance measures directed toward pollen, mold, dust mite, cat, dog, and cockroach as listed below Continue cetirizine  10 mg once a day as needed for runny nose or itch Continue Flonase 2 sprays in each nostril once a day as needed for stuffy nose Consider saline nasal rinses as needed for nasal symptoms. Use this before any medicated nasal sprays for best result Consider allergen immunotherapy if your symptoms are not well-controlled with the treatment plan as listed above  Rash  Continue a twice a day moisturizing routine Continue betamethasone  twice a day as needed Recommend referral to dermatology. Call the clinic if your symptoms worsen  Your blood pressure was elevated at today's visit.  Follow-up with your primary care provider for further evaluation of your blood pressure.  Call the clinic if this treatment plan is not working well for you  Follow up in 3 months or sooner if needed.   Return in about 3 months (around 06/04/2023), or if symptoms worsen or fail to improve.    Thank you for the opportunity to care for this patient.  Please do not hesitate to contact me with questions.  Arlean Mutter, FNP [facility] 

## 2023-03-06 NOTE — Patient Instructions (Addendum)
 Asthma COPD syndrome Continue montelukast  10 mg once a day to prevent cough or wheeze Continue Breztri  2 puffs twice a day with a spacer to prevent cough or wheeze Continue albuterol  2 puffs once every 4 hours if needed for cough or wheeze You may use albuterol  2 puffs 5 to 15 minutes before activity to decrease cough or wheeze  Allergic rhinitis Continue allergen avoidance measures directed toward pollen, mold, dust mite, cat, dog, and cockroach as listed below Continue cetirizine  10 mg once a day as needed for runny nose or itch Continue Flonase 2 sprays in each nostril once a day as needed for stuffy nose Consider saline nasal rinses as needed for nasal symptoms. Use this before any medicated nasal sprays for best result Consider allergen immunotherapy if your symptoms are not well-controlled with the treatment plan as listed above  Rash  Continue a twice a day moisturizing routine Continue betamethasone  twice a day as needed Recommend referral to dermatology. Call the clinic if your symptoms worsen  Your blood pressure was elevated at today's visit.  Follow-up with your primary care provider for further evaluation of your blood pressure.  Call the clinic if this treatment plan is not working well for you  Follow up in 3 months or sooner if needed.  Reducing Pollen Exposure The American Academy of Allergy, Asthma and Immunology suggests the following steps to reduce your exposure to pollen during allergy seasons. Do not hang sheets or clothing out to dry; pollen may collect on these items. Do not mow lawns or spend time around freshly cut grass; mowing stirs up pollen. Keep windows closed at night.  Keep car windows closed while driving. Minimize morning activities outdoors, a time when pollen counts are usually at their highest. Stay indoors as much as possible when pollen counts or humidity is high and on windy days when pollen tends to remain in the air longer. Use air  conditioning when possible.  Many air conditioners have filters that trap the pollen spores. Use a HEPA room air filter to remove pollen form the indoor air you breathe.  Control of Mold Allergen Mold and fungi can grow on a variety of surfaces provided certain temperature and moisture conditions exist.  Outdoor molds grow on plants, decaying vegetation and soil.  The major outdoor mold, Alternaria and Cladosporium, are found in very high numbers during hot and dry conditions.  Generally, a late Summer - Fall peak is seen for common outdoor fungal spores.  Rain will temporarily lower outdoor mold spore count, but counts rise rapidly when the rainy period ends.  The most important indoor molds are Aspergillus and Penicillium.  Dark, humid and poorly ventilated basements are ideal sites for mold growth.  The next most common sites of mold growth are the bathroom and the kitchen.  Outdoor [employer] Use air conditioning and keep windows closed Avoid exposure to decaying vegetation. Avoid leaf raking. Avoid grain handling. Consider wearing a face mask if working in moldy areas.  Indoor Mold Control Maintain humidity below 50%. Clean washable surfaces with 5% bleach solution. Remove sources e.g. Contaminated carpets.   Control of Dust Mite Allergen Dust mites play a major role in allergic asthma and rhinitis. They occur in environments with high humidity wherever human skin is found. Dust mites absorb humidity from the atmosphere (ie, they do not drink) and feed on organic matter (including shed human and animal skin). Dust mites are a microscopic type of insect that you cannot see with the  naked eye. High levels of dust mites have been detected from mattresses, pillows, carpets, upholstered furniture, bed covers, clothes, soft toys and any woven material. The principal allergen of the dust mite is found in its feces. A gram of dust may contain 1,000 mites and 250,000 fecal particles. Mite antigen  is easily measured in the air during house cleaning activities. Dust mites do not bite and do not cause harm to humans, other than by triggering allergies/asthma.  Ways to decrease your exposure to dust mites in your home:  1. Encase mattresses, box springs and pillows with a mite-impermeable barrier or cover  2. Wash sheets, blankets and drapes weekly in hot water (130 F) with detergent and dry them in a dryer on the hot setting.  3. Have the room cleaned frequently with a vacuum cleaner and a damp dust-mop. For carpeting or rugs, vacuuming with a vacuum cleaner equipped with a high-efficiency particulate air (HEPA) filter. The dust mite allergic individual should not be in a room which is being cleaned and should wait 1 hour after cleaning before going into the room.  4. Do not sleep on upholstered furniture (eg, couches).  5. If possible removing carpeting, upholstered furniture and drapery from the home is ideal. Horizontal blinds should be eliminated in the rooms where the person spends the most time (bedroom, study, television room). Washable vinyl, roller-type shades are optimal.  6. Remove all non-washable stuffed toys from the bedroom. Wash stuffed toys weekly like sheets and blankets above.  7. Reduce indoor humidity to less than 50%. Inexpensive humidity monitors can be purchased at most hardware stores. Do not use a humidifier as can make the problem worse and are not recommended.  Control of Dog or Cat Allergen Avoidance is the best way to manage a dog or cat allergy. If you have a dog or cat and are allergic to dog or cats, consider removing the dog or cat from the home. If you have a dog or cat but don't want to find it a new home, or if your family wants a pet even though someone in the household is allergic, here are some strategies that may help keep symptoms at bay:  Keep the pet out of your bedroom and restrict it to only a few rooms. Be advised that keeping the dog or cat  in only one room will not limit the allergens to that room. Don't pet, hug or kiss the dog or cat; if you do, wash your hands with soap and water. High-efficiency particulate air (HEPA) cleaners run continuously in a bedroom or living room can reduce allergen levels over time. Regular use of a high-efficiency vacuum cleaner or a central vacuum can reduce allergen levels. Giving your dog or cat a bath at least once a week can reduce airborne allergen.   Control of Cockroach Allergen Cockroach allergen has been identified as an important cause of acute attacks of asthma, especially in urban settings.  There are fifty-five species of cockroach that exist in the [place] , however only three, the American, German and Oriental species produce allergen that can affect patients with Asthma.  Allergens can be obtained from fecal particles, egg casings and secretions from cockroaches.    Remove food sources. Reduce access to water. Seal access and entry points. Spray runways with 0.5-1% Diazinon or Chlorpyrifos Blow boric acid power under stoves and refrigerator. Place bait stations (hydramethylnon) at feeding sites.

## 2023-03-11 ENCOUNTER — Ambulatory Visit (HOSPITAL_COMMUNITY): Payer: PPO

## 2023-03-13 ENCOUNTER — Ambulatory Visit (HOSPITAL_COMMUNITY)
Admission: RE | Admit: 2023-03-13 | Discharge: 2023-03-13 | Disposition: A | Discharge status: Home / Self Care | Payer: PPO | Source: Ambulatory Visit | Attending: Internal Medicine | Admitting: Internal Medicine

## 2023-03-13 DIAGNOSIS — R059 Cough, unspecified: Secondary | ICD-10-CM | POA: Diagnosis not present

## 2023-03-13 DIAGNOSIS — J329 Chronic sinusitis, unspecified: Secondary | ICD-10-CM | POA: Diagnosis not present

## 2023-03-13 DIAGNOSIS — J4489 Other specified chronic obstructive pulmonary disease: Secondary | ICD-10-CM | POA: Diagnosis not present

## 2023-03-13 DIAGNOSIS — J45909 Unspecified asthma, uncomplicated: Secondary | ICD-10-CM | POA: Diagnosis not present

## 2023-03-19 ENCOUNTER — Telehealth: Payer: Self-pay

## 2023-03-19 NOTE — Telephone Encounter (Signed)
-----   Message from Thermon Leyland sent at 03/06/2023  4:56 PM EST ----- Can you please refer this patient to dermatology for evaluation and treatment of psoriasis? He is interested an office that takes his insurance and is close to [place]. He said he will travel as far as [place] if needed. Thank you

## 2023-03-25 ENCOUNTER — Telehealth: Payer: Self-pay

## 2023-03-25 NOTE — Telephone Encounter (Signed)
-----   Message from Sandrea Hughs sent at 03/25/2023 11:39 AM EST ----- Call patient :  sinus improved, no change in recs

## 2023-03-25 NOTE — Telephone Encounter (Signed)
I left a message for the patient to return my call.

## 2023-04-23 ENCOUNTER — Other Ambulatory Visit: Payer: Self-pay | Admitting: Family Medicine

## 2023-05-11 DIAGNOSIS — J449 Chronic obstructive pulmonary disease, unspecified: Secondary | ICD-10-CM | POA: Diagnosis not present

## 2023-05-11 DIAGNOSIS — Z79899 Other long term (current) drug therapy: Secondary | ICD-10-CM | POA: Diagnosis not present

## 2023-05-11 DIAGNOSIS — N1831 Chronic kidney disease, stage 3a: Secondary | ICD-10-CM | POA: Diagnosis not present

## 2023-05-18 DIAGNOSIS — J4550 Severe persistent asthma, uncomplicated: Secondary | ICD-10-CM | POA: Diagnosis not present

## 2023-05-18 DIAGNOSIS — B356 Tinea cruris: Secondary | ICD-10-CM | POA: Diagnosis not present

## 2023-05-18 DIAGNOSIS — I10 Essential (primary) hypertension: Secondary | ICD-10-CM | POA: Diagnosis not present

## 2023-05-18 DIAGNOSIS — N1831 Chronic kidney disease, stage 3a: Secondary | ICD-10-CM | POA: Diagnosis not present

## 2023-06-07 ENCOUNTER — Other Ambulatory Visit: Payer: Self-pay | Admitting: Family Medicine

## 2023-06-09 NOTE — Progress Notes (Unsigned)
   [address] [place] [address] Dept: [phone]  FOLLOW UP NOTE  Patient ID: Thomas [name], male    DOB: 1961-02-01  Age: 63 y.o. MRN: [id number] Date of Office Visit: 06/10/2023  Assessment  Chief Complaint: No chief complaint on file.  HPI Thomas [name] is a 63 year old male who presents to the clinic for a follow up visit. He was last seen in this clinic on 03/06/2023 by Thermon Leyland, FNP, for evaluation of   Discussed the use of AI scribe software for clinical note transcription with the patient, who gave verbal consent to proceed.  History of Present Illness    EXAM: CT MAXILLOFACIAL WITHOUT CONTRAST   TECHNIQUE: Multidetector CT images of the paranasal sinuses were obtained using the standard protocol without intravenous contrast.   RADIATION DOSE REDUCTION: This exam was performed according to the departmental dose-optimization program which includes automated exposure control, adjustment of the mA and/or kV according to patient size and/or use of iterative reconstruction technique.   COMPARISON:  08/09/2020   FINDINGS: Paranasal sinuses:   Frontal: Normally aerated. Patent frontal sinus drainage pathways.   Ethmoid: Partial right ethmoidectomy without sinusitis.   Maxillary: Decrease in mucosal thickening since prior. There is a 5 mm calcific structure along the floor of the right maxillary sinus with mild adjacent mucosal thickening, density and shape similar to a tooth root, there is a small fragment of molar tooth root in the subjacent maxilla with adjacent bony erosion.   Sphenoid: Normally aerated. Patent sphenoethmoidal recesses.   Right ostiomeatal unit: Widely patent after antrostomy and middle turbinectomy.   Left ostiomeatal unit: Patent.   Nasal passages: Patent. Intact nasal septum is midline.   Anatomy: No pneumatization superior to anterior ethmoid notches. Sellar sphenoid pneumatization pattern. No dehiscence of  carotid or optic canals. No onodi cell.   Other: No incidental finding on soft tissue windows.   IMPRESSION: 1. Prior endoscopic sinus surgery with widely patent openings, no sinus outflow obstruction on either side. 2. Decrease in mucosal thickening in the right maxillary sinus, residual small opacity surrounds a 5 mm calcific density that may be a tooth root fragment. Further erosion of a right upper maxillary molar with small residual root.     Electronically Signed   By: Tiburcio Pea M.D.   On: 03/25/2023 09:13  Drug Allergies:  No Known Allergies  Physical Exam: There were no vitals taken for this visit.   Physical Exam  Diagnostics:    Assessment and Plan: No diagnosis found.  No orders of the defined types were placed in this encounter.   There are no Patient Instructions on file for this visit.  No follow-ups on file.    Thank you for the opportunity to care for this patient.  Please do not hesitate to contact me with questions.  Thermon Leyland, FNP [facility]

## 2023-06-09 NOTE — Patient Instructions (Incomplete)
 Asthma COPD syndrome Continue montelukast 10 mg once a day to prevent cough or wheeze Continue Breztri 2 puffs twice a day with a spacer to prevent cough or wheeze Continue albuterol 2 puffs once every 4 hours if needed for cough or wheeze You may use albuterol 2 puffs 5 to 15 minutes before activity to decrease cough or wheeze Continue Nucala injections once every 28 days for asthma control  Allergic rhinitis Continue allergen avoidance measures directed toward pollen, mold, dust mite, cat, dog, and cockroach as listed below Continue cetirizine 10 mg once a day as needed for runny nose or itch Continue Flonase 2 sprays in each nostril once a day as needed for stuffy nose Consider saline nasal rinses as needed for nasal symptoms. Use this before any medicated nasal sprays for best result Consider allergen immunotherapy if your symptoms are not well-controlled with the treatment plan as listed above  Rash  Continue a twice a day moisturizing routine Continue betamethasone twice a day as needed. Do not use this medication longer than 2 weeks in a row Continue with your appointment to dermatology for further evaluation. Call the clinic if your symptoms worsen  Eosinophilia Your eosinophil count was elevated on the last lab work on 10/02/2021. Let's recheck that lab to make sure the count is decreasing. We will call you when the results become available  Your blood pressure was elevated at today's visit.  Follow-up with your primary care provider for further evaluation of your blood pressure.  Call the clinic if this treatment plan is not working well for you  Follow up in 6 months or sooner if needed.  Reducing Pollen Exposure The American Academy of Allergy, Asthma and Immunology suggests the following steps to reduce your exposure to pollen during allergy seasons. Do not hang sheets or clothing out to dry; pollen may collect on these items. Do not mow lawns or spend time around freshly  cut grass; mowing stirs up pollen. Keep windows closed at night.  Keep car windows closed while driving. Minimize morning activities outdoors, a time when pollen counts are usually at their highest. Stay indoors as much as possible when pollen counts or humidity is high and on windy days when pollen tends to remain in the air longer. Use air conditioning when possible.  Many air conditioners have filters that trap the pollen spores. Use a HEPA room air filter to remove pollen form the indoor air you breathe.  Control of Mold Allergen Mold and fungi can grow on a variety of surfaces provided certain temperature and moisture conditions exist.  Outdoor molds grow on plants, decaying vegetation and soil.  The major outdoor mold, Alternaria and Cladosporium, are found in very high numbers during hot and dry conditions.  Generally, a late Summer - Fall peak is seen for common outdoor fungal spores.  Rain will temporarily lower outdoor mold spore count, but counts rise rapidly when the rainy period ends.  The most important indoor molds are Aspergillus and Penicillium.  Dark, humid and poorly ventilated basements are ideal sites for mold growth.  The next most common sites of mold growth are the bathroom and the kitchen.  Outdoor [employer] Use air conditioning and keep windows closed Avoid exposure to decaying vegetation. Avoid leaf raking. Avoid grain handling. Consider wearing a face mask if working in moldy areas.  Indoor Mold Control Maintain humidity below 50%. Clean washable surfaces with 5% bleach solution. Remove sources e.g. Contaminated carpets.   Control of Dust Mite  Allergen Dust mites play a major role in allergic asthma and rhinitis. They occur in environments with high humidity wherever human skin is found. Dust mites absorb humidity from the atmosphere (ie, they do not drink) and feed on organic matter (including shed human and animal skin). Dust mites are a microscopic type of  insect that you cannot see with the naked eye. High levels of dust mites have been detected from mattresses, pillows, carpets, upholstered furniture, bed covers, clothes, soft toys and any woven material. The principal allergen of the dust mite is found in its feces. A gram of dust may contain 1,000 mites and 250,000 fecal particles. Mite antigen is easily measured in the air during house cleaning activities. Dust mites do not bite and do not cause harm to humans, other than by triggering allergies/asthma.  Ways to decrease your exposure to dust mites in your home:  1. Encase mattresses, box springs and pillows with a mite-impermeable barrier or cover  2. Wash sheets, blankets and drapes weekly in hot water (130 F) with detergent and dry them in a dryer on the hot setting.  3. Have the room cleaned frequently with a vacuum cleaner and a damp dust-mop. For carpeting or rugs, vacuuming with a vacuum cleaner equipped with a high-efficiency particulate air (HEPA) filter. The dust mite allergic individual should not be in a room which is being cleaned and should wait 1 hour after cleaning before going into the room.  4. Do not sleep on upholstered furniture (eg, couches).  5. If possible removing carpeting, upholstered furniture and drapery from the home is ideal. Horizontal blinds should be eliminated in the rooms where the person spends the most time (bedroom, study, television room). Washable vinyl, roller-type shades are optimal.  6. Remove all non-washable stuffed toys from the bedroom. Wash stuffed toys weekly like sheets and blankets above.  7. Reduce indoor humidity to less than 50%. Inexpensive humidity monitors can be purchased at most hardware stores. Do not use a humidifier as can make the problem worse and are not recommended.  Control of Dog or Cat Allergen Avoidance is the best way to manage a dog or cat allergy. If you have a dog or cat and are allergic to dog or cats, consider  removing the dog or cat from the home. If you have a dog or cat but don't want to find it a new home, or if your family wants a pet even though someone in the household is allergic, here are some strategies that may help keep symptoms at bay:  Keep the pet out of your bedroom and restrict it to only a few rooms. Be advised that keeping the dog or cat in only one room will not limit the allergens to that room. Don't pet, hug or kiss the dog or cat; if you do, wash your hands with soap and water. High-efficiency particulate air (HEPA) cleaners run continuously in a bedroom or living room can reduce allergen levels over time. Regular use of a high-efficiency vacuum cleaner or a central vacuum can reduce allergen levels. Giving your dog or cat a bath at least once a week can reduce airborne allergen.   Control of Cockroach Allergen Cockroach allergen has been identified as an important cause of acute attacks of asthma, especially in urban settings.  There are fifty-five species of cockroach that exist in the [place], however only three, the [place], [place] species produce allergen that can affect patients with Asthma.  Allergens can  be obtained from fecal particles, egg casings and secretions from cockroaches.    Remove food sources. Reduce access to water. Seal access and entry points. Spray runways with 0.5-1% Diazinon or Chlorpyrifos Blow boric acid power under stoves and refrigerator. Place bait stations (hydramethylnon) at feeding sites.

## 2023-06-10 ENCOUNTER — Encounter: Payer: Self-pay | Admitting: Family Medicine

## 2023-06-10 ENCOUNTER — Ambulatory Visit: Payer: PPO | Admitting: Family Medicine

## 2023-06-10 VITALS — BP 166/92 | HR 79 | Temp 98.7°F | Resp 20 | Wt 310.0 lb

## 2023-06-10 DIAGNOSIS — R21 Rash and other nonspecific skin eruption: Secondary | ICD-10-CM

## 2023-06-10 DIAGNOSIS — J4550 Severe persistent asthma, uncomplicated: Secondary | ICD-10-CM

## 2023-06-10 DIAGNOSIS — J3089 Other allergic rhinitis: Secondary | ICD-10-CM | POA: Diagnosis not present

## 2023-06-10 DIAGNOSIS — J302 Other seasonal allergic rhinitis: Secondary | ICD-10-CM | POA: Diagnosis not present

## 2023-06-10 DIAGNOSIS — D7219 Other eosinophilia: Secondary | ICD-10-CM | POA: Diagnosis not present

## 2023-06-10 MED ORDER — BREZTRI AEROSPHERE 160-9-4.8 MCG/ACT IN AERO: 2.0000 | INHALATION_SPRAY | Freq: Two times a day (BID) | RESPIRATORY_TRACT | 5 refills | Status: DC

## 2023-06-10 MED ORDER — BREZTRI AEROSPHERE 160-9-4.8 MCG/ACT IN AERO: 2.0000 | INHALATION_SPRAY | Freq: Two times a day (BID) | RESPIRATORY_TRACT | Status: DC

## 2023-06-10 NOTE — Progress Notes (Signed)
..  Medication Samples have been provided to the patient.  Drug name: Breztri       Strength: 160/9/4.46mcg        Qty: 1  LOT: [id number]  Exp.Date: 11/30/25  Dosing instructions: 2 puffs twice a day with a spacer  The patient has been instructed regarding the correct time, dose, and frequency of taking this medication, including desired effects and most common side effects.   Lynnae Sandhoff Akaila Rambo 5:35 PM 06/10/2023

## 2023-06-12 ENCOUNTER — Telehealth: Payer: Self-pay | Admitting: Family Medicine

## 2023-06-12 NOTE — Telephone Encounter (Signed)
 LMOM for patient to call the clinic. Is he eating mammalian meats or avoiding at this time?

## 2023-06-15 NOTE — Telephone Encounter (Signed)
 Patient reports that he continues to eat mammalian meats with no issues.  He is not interested in having an epinephrine pen or avoiding mammalian meat at this time.  He is not interested in rechecking for alpha gal allergy.

## 2023-08-11 ENCOUNTER — Other Ambulatory Visit: Payer: Self-pay | Admitting: Allergy & Immunology

## 2023-08-12 DIAGNOSIS — I10 Essential (primary) hypertension: Secondary | ICD-10-CM | POA: Diagnosis not present

## 2023-08-12 DIAGNOSIS — Z125 Encounter for screening for malignant neoplasm of prostate: Secondary | ICD-10-CM | POA: Diagnosis not present

## 2023-08-12 DIAGNOSIS — Z79899 Other long term (current) drug therapy: Secondary | ICD-10-CM | POA: Diagnosis not present

## 2023-08-12 DIAGNOSIS — J449 Chronic obstructive pulmonary disease, unspecified: Secondary | ICD-10-CM | POA: Diagnosis not present

## 2023-08-12 DIAGNOSIS — N1831 Chronic kidney disease, stage 3a: Secondary | ICD-10-CM | POA: Diagnosis not present

## 2023-08-12 DIAGNOSIS — J45909 Unspecified asthma, uncomplicated: Secondary | ICD-10-CM | POA: Diagnosis not present

## 2023-08-19 DIAGNOSIS — I10 Essential (primary) hypertension: Secondary | ICD-10-CM | POA: Diagnosis not present

## 2023-08-19 DIAGNOSIS — M1000 Idiopathic gout, unspecified site: Secondary | ICD-10-CM | POA: Diagnosis not present

## 2023-08-19 DIAGNOSIS — Z8546 Personal history of malignant neoplasm of prostate: Secondary | ICD-10-CM | POA: Diagnosis not present

## 2023-08-19 DIAGNOSIS — N1831 Chronic kidney disease, stage 3a: Secondary | ICD-10-CM | POA: Diagnosis not present

## 2023-10-05 DIAGNOSIS — Z87891 Personal history of nicotine dependence: Secondary | ICD-10-CM | POA: Diagnosis not present

## 2023-10-05 DIAGNOSIS — E890 Postprocedural hypothyroidism: Secondary | ICD-10-CM | POA: Diagnosis not present

## 2023-10-05 DIAGNOSIS — J4489 Other specified chronic obstructive pulmonary disease: Secondary | ICD-10-CM | POA: Diagnosis not present

## 2023-10-05 DIAGNOSIS — J309 Allergic rhinitis, unspecified: Secondary | ICD-10-CM | POA: Diagnosis not present

## 2023-10-05 DIAGNOSIS — M109 Gout, unspecified: Secondary | ICD-10-CM | POA: Diagnosis not present

## 2023-10-05 DIAGNOSIS — Z6841 Body Mass Index (BMI) 40.0 and over, adult: Secondary | ICD-10-CM | POA: Diagnosis not present

## 2023-10-05 DIAGNOSIS — G8929 Other chronic pain: Secondary | ICD-10-CM | POA: Diagnosis not present

## 2023-10-05 DIAGNOSIS — R609 Edema, unspecified: Secondary | ICD-10-CM | POA: Diagnosis not present

## 2023-10-05 DIAGNOSIS — I10 Essential (primary) hypertension: Secondary | ICD-10-CM | POA: Diagnosis not present

## 2023-11-29 ENCOUNTER — Other Ambulatory Visit: Payer: Self-pay | Admitting: Family Medicine

## 2023-11-29 DIAGNOSIS — J4550 Severe persistent asthma, uncomplicated: Secondary | ICD-10-CM

## 2023-12-01 DIAGNOSIS — Z79899 Other long term (current) drug therapy: Secondary | ICD-10-CM | POA: Diagnosis not present

## 2023-12-01 DIAGNOSIS — N1831 Chronic kidney disease, stage 3a: Secondary | ICD-10-CM | POA: Diagnosis not present

## 2023-12-01 DIAGNOSIS — I10 Essential (primary) hypertension: Secondary | ICD-10-CM | POA: Diagnosis not present

## 2023-12-08 DIAGNOSIS — N183 Chronic kidney disease, stage 3 unspecified: Secondary | ICD-10-CM | POA: Diagnosis not present

## 2023-12-08 DIAGNOSIS — F321 Major depressive disorder, single episode, moderate: Secondary | ICD-10-CM | POA: Diagnosis not present

## 2023-12-08 DIAGNOSIS — I10 Essential (primary) hypertension: Secondary | ICD-10-CM | POA: Diagnosis not present

## 2023-12-16 ENCOUNTER — Encounter: Payer: Self-pay | Admitting: Allergy & Immunology

## 2023-12-16 ENCOUNTER — Ambulatory Visit: Admitting: Allergy & Immunology

## 2023-12-16 ENCOUNTER — Other Ambulatory Visit: Payer: Self-pay

## 2023-12-16 VITALS — BP 130/80 | HR 82 | Temp 97.2°F | Resp 16 | Ht 67.72 in | Wt 313.0 lb

## 2023-12-16 DIAGNOSIS — Z91018 Allergy to other foods: Secondary | ICD-10-CM | POA: Diagnosis not present

## 2023-12-16 DIAGNOSIS — J302 Other seasonal allergic rhinitis: Secondary | ICD-10-CM

## 2023-12-16 DIAGNOSIS — J3089 Other allergic rhinitis: Secondary | ICD-10-CM

## 2023-12-16 DIAGNOSIS — D7219 Other eosinophilia: Secondary | ICD-10-CM | POA: Diagnosis not present

## 2023-12-16 DIAGNOSIS — J4550 Severe persistent asthma, uncomplicated: Secondary | ICD-10-CM | POA: Diagnosis not present

## 2023-12-16 MED ORDER — MONTELUKAST SODIUM 10 MG PO TABS: 10.0000 mg | ORAL_TABLET | Freq: Every evening | ORAL | 1 refills | Status: AC

## 2023-12-16 MED ORDER — CETIRIZINE HCL 10 MG PO TABS: 10.0000 mg | ORAL_TABLET | Freq: Two times a day (BID) | ORAL | 1 refills | Status: AC

## 2023-12-16 MED ORDER — BREZTRI AEROSPHERE 160-9-4.8 MCG/ACT IN AERO: 2.0000 | INHALATION_SPRAY | Freq: Two times a day (BID) | RESPIRATORY_TRACT | 1 refills | Status: AC

## 2023-12-16 MED ORDER — ALBUTEROL SULFATE HFA 108 (90 BASE) MCG/ACT IN AERS: 2.0000 | INHALATION_SPRAY | RESPIRATORY_TRACT | 1 refills | Status: AC | PRN

## 2023-12-16 NOTE — Progress Notes (Signed)
 FOLLOW UP  Date of Service/Encounter:  12/16/23   Assessment:   Asthma-COPD overlap syndrome - without improvement with Nucala  or Dupixent (feels the same without either of these on board)   Physically deconditioned - likely contributing to his SOB   History of smoking - but stopped  Alpha gal allergy    Seasonal and perennial allergic rhinitis (grasses, ragweed, weeds, trees, indoor molds, outdoor molds, dust mites, cat, dog, and cockroach)   Elevated absolute eosinophil count - likely secondary to uncontrolled atopic disease, but could consider hypereosinophilic work-up in the future if indicated   Recent social stressor - lost wife to dementia (summer 2024)    Plan/Recommendations:   1. Asthma-COPD overlap syndrome with acute exacerbation - Lung testing looks excellent today.  - Daily controller medication(s): Breztri  two puffs twice with spacer and Singulair  (montelukast ) 10mg  daily - Prior to physical activity: albuterol  2 puffs 10-15 minutes before physical activity. - Rescue medications: albuterol  4 puffs every 4-6 hours as needed - Asthma control goals:  * Full participation in all desired activities (may need albuterol  before activity) * Albuterol  use two time or less a week on average (not counting use with activity) * Cough interfering with sleep two time or less a month * Oral steroids no more than once a year * No hospitalizations  2. Seasonal and perennial allergic rhinitis - Previous testing showed: grasses, ragweed, weeds, trees, indoor molds, outdoor molds, dust mites, cat, dog, and cockroach  - Continue with: Singulair  (montelukast ) 10mg  daily - Continue with: Zyrtec  (cetirizine ) 10mg  tablet once daily - You can use an extra dose of the antihistamine, if needed, for breakthrough symptoms.  - Consider nasal saline rinses 1-2 times daily to remove allergens from the nasal cavities as well as help with mucous clearance (this is especially helpful to do  before the nasal sprays are given) - Consider allergy shots.  3. Rash - looks like psoriasis - Start Vtama once daily to the sites.  4. Alpha gal allergy - Last testing in July 2023 - Could repeat at the next visit.  - EpiPen  updated.   5. Return in about 6 weeks (around 01/27/2024). You can have the follow up appointment with Dr. Iva or a Nurse Practicioner (our Nurse Practitioners are excellent and always have Physician oversight!).    Subjective:   Thomas [name] is a 63 y.o. male presenting today for follow up of No chief complaint on file.   Thomas [name] has a history of the following: Patient Active Problem List   Diagnosis Date Noted   Acquired trigger finger of right index finger 06/30/2022   Chronic sinusitis 12/12/2020   Acute on chronic diastolic HF (heart failure) (HCC)    Acute hypoxemic respiratory failure (HCC) 01/24/2020   Obesity, Class III, BMI 40-49.9 (morbid obesity) (HCC) 05/05/2019   COPD exacerbation (HCC) 05/04/2019   Hypoxia    Leg edema    COPD with acute exacerbation (HCC) 09/16/2018   CKD (chronic kidney disease), stage III (HCC) 09/16/2018   Acute respiratory failure with hypoxia (HCC) 09/16/2018   Allergic rhinitis 08/26/2018   ARF (acute renal failure) 10/17/2015   Hypertension    Asthmatic bronchitis , chronic    GERD (gastroesophageal reflux disease)    Arthritis    Kidney stones    Prostate cancer (HCC)    Thyroid  nodule    OSA (obstructive sleep apnea)    Medial meniscus tear    Lateral meniscus tear    Sleep apnea  11/29/2009   HYPERSOMNIA 09/26/2009   DYSPNEA 09/26/2009   PROSTATE CANCER, HX OF 09/26/2009   Essential hypertension 09/25/2009    History obtained from: chart review and patient.  Discussed the use of AI scribe software for clinical note transcription with the patient and/or guardian, who gave verbal consent to proceed.  Thomas [name] is a 63 y.o. male presenting for a follow up visit. He was last seen in  April 2025. At that tie, we continued with the montelukast  as well as Breztri  two puffs BID and albuterol . He also remained on Nucala  injections every 28 days.  For his allergic rhinitis, we continue with cetirizine  as well as Flonase.  Allergy shots were discussed for long-term control.  For his rash, he was continued on betamethasone .  His eosinophilia had improved.  He did have an elevated blood pressure.  He never did get the repeat CBC done.  Since last visit, she has done well.   Asthma/Respiratory Symptom History: He is currently on Nucala  for asthma, which he finds helpful without significant side effects, unlike previous medications that caused a knot on his leg. He injects Nucala  without issues, noting only a minor bump attributed to fluid. He has not used his emergency inhaler in four to five months and is on Breztri  as a daily inhaler, which he obtains at a low cost. He experiences shortness of breath but not to the extent of needing a rescue inhaler. He notes improvement in his ability to perform activities without becoming breathless.  Allergic Rhinitis Symptom History: No issues with environmental allergies such as sneezing or itchy eyes.  Skin Symptom History: He has a history of psoriasis, primarily affecting his knees, elbows, and back. He uses MG17, a non-prescription coal tar ointment, which helps control but not clear the condition. Previous treatments with clobetasol  and betamethasone  also only controlled the symptoms. He has not yet seen a dermatologist.  Infection Symptom History: No recent sinus infections, urgent care, or emergency room visits.   He discusses experiencing depression following the passing of his wife, who had dementia. He reports a lack of energy and motivation, which he attributes to his grief. He has started a new medication for depression and is scheduled for a follow-up in six weeks. No suicidal thoughts.   Otherwise, there have been no changes to his past  medical history, surgical history, family history, or social history.    Review of systems otherwise negative other than that mentioned in the HPI.    Objective:   Blood pressure 130/80, pulse 82, temperature (!) 97.2 F (36.2 C), temperature source Temporal, resp. rate 16, height 5' 7.72 (1.72 m), weight (!) 313 lb (142 kg), SpO2 94%. Body mass index is 47.99 kg/m.    Physical Exam Vitals reviewed.  Constitutional:      Appearance: He is well-developed. He is obese. He is not ill-appearing or toxic-appearing.     Comments: Pleasant.  Cooperative.  HENT:     Head: Normocephalic and atraumatic.     Right Ear: Tympanic membrane, ear canal and external ear normal. No drainage, swelling or tenderness. Tympanic membrane is not injected, scarred, erythematous, retracted or bulging.     Left Ear: Tympanic membrane, ear canal and external ear normal. No drainage, swelling or tenderness. Tympanic membrane is not injected, scarred, erythematous, retracted or bulging.     Nose: No nasal deformity, septal deviation, mucosal edema or rhinorrhea.     Right Turbinates: Enlarged, swollen and pale.     Left Turbinates: Enlarged,  swollen and pale.     Right Sinus: No maxillary sinus tenderness or frontal sinus tenderness.     Left Sinus: No maxillary sinus tenderness or frontal sinus tenderness.     Mouth/Throat:     Mouth: Mucous membranes are not pale and not dry.     Pharynx: Uvula midline.  Eyes:     General:        Right eye: No discharge.        Left eye: No discharge.     Conjunctiva/sclera: Conjunctivae normal.     Right eye: Right conjunctiva is not injected. No chemosis.    Left eye: Left conjunctiva is not injected. No chemosis.    Pupils: Pupils are equal, round, and reactive to light.  Cardiovascular:     Rate and Rhythm: Normal rate and regular rhythm.     Heart sounds: Normal heart sounds.  Pulmonary:     Effort: Pulmonary effort is normal. No tachypnea, accessory muscle  usage or respiratory distress.     Breath sounds: Normal breath sounds. No wheezing, rhonchi or rales.  Chest:     Chest wall: No tenderness.  Abdominal:     Tenderness: There is no abdominal tenderness. There is no guarding or rebound.  Lymphadenopathy:     Head:     Right side of head: No submandibular, tonsillar or occipital adenopathy.     Left side of head: No submandibular, tonsillar or occipital adenopathy.     Cervical: No cervical adenopathy.  Skin:    Coloration: Skin is not pale.     Findings: No abrasion, erythema, petechiae or rash. Rash is not papular, urticarial or vesicular.  Neurological:     Mental Status: He is alert.  Psychiatric:        Behavior: Behavior is cooperative.      Diagnostic studies:    Spirometry: results normal (FEV1: 2.46/71%, FVC: 3.70/82%, FEV1/FVC: 66%).    Spirometry consistent with normal pattern.    Allergy Studies: none      Marty Shaggy, MD  [facility] 

## 2023-12-16 NOTE — Patient Instructions (Addendum)
 1. Asthma-COPD overlap syndrome with acute exacerbation - Lung testing looks excellent today.  - Daily controller medication(s): Breztri  two puffs twice with spacer and Singulair  (montelukast ) 10mg  daily - Prior to physical activity: albuterol  2 puffs 10-15 minutes before physical activity. - Rescue medications: albuterol  4 puffs every 4-6 hours as needed - Asthma control goals:  * Full participation in all desired activities (may need albuterol  before activity) * Albuterol  use two time or less a week on average (not counting use with activity) * Cough interfering with sleep two time or less a month * Oral steroids no more than once a year * No hospitalizations  2. Seasonal and perennial allergic rhinitis - Previous testing showed: grasses, ragweed, weeds, trees, indoor molds, outdoor molds, dust mites, cat, dog, and cockroach  - Continue with: Singulair  (montelukast ) 10mg  daily - Continue with: Zyrtec  (cetirizine ) 10mg  tablet once daily - You can use an extra dose of the antihistamine, if needed, for breakthrough symptoms.  - Consider nasal saline rinses 1-2 times daily to remove allergens from the nasal cavities as well as help with mucous clearance (this is especially helpful to do before the nasal sprays are given) - Consider allergy shots.  3. Rash - looks like psoriasis - Start Vtama once daily to the sites.  4. Return in about 6 weeks (around 01/27/2024). You can have the follow up appointment with Dr. Iva or a Nurse Practicioner (our Nurse Practitioners are excellent and always have Physician oversight!).    Please inform us  of any Emergency Department visits, hospitalizations, or changes in symptoms. Call us  before going to the ED for breathing or allergy symptoms since we might be able to fit you in for a sick visit. Feel free to contact us  anytime with any questions, problems, or concerns.  It was a pleasure to see you again today! We are so sorry to hear about your  wife.  Websites that have reliable patient information: 1. American Academy of Asthma, Allergy, and Immunology: www.aaaai.org 2. Food Allergy Research and Education (FARE): foodallergy.org 3. Mothers of Asthmatics: http://www.asthmacommunitynetwork.org 4. American College of Allergy, Asthma, and Immunology: www.acaai.org      "Like" us  on Facebook and Instagram for our latest updates!      A healthy democracy works best when [employer] participate! Make sure you are registered to vote! If you have moved or changed any of your contact information, you will need to get this updated before voting! Scan the QR codes below to learn more!

## 2023-12-21 ENCOUNTER — Encounter: Payer: Self-pay | Admitting: Allergy & Immunology

## 2023-12-21 MED ORDER — EPINEPHRINE 0.3 MG/0.3ML IJ SOAJ: 0.3000 mg | Freq: Once | INTRAMUSCULAR | 2 refills | Status: AC

## 2024-01-19 DIAGNOSIS — F321 Major depressive disorder, single episode, moderate: Secondary | ICD-10-CM | POA: Diagnosis not present

## 2024-01-19 DIAGNOSIS — J4550 Severe persistent asthma, uncomplicated: Secondary | ICD-10-CM | POA: Diagnosis not present

## 2024-01-19 NOTE — Progress Notes (Signed)
   [address] [place] [address] Dept: [phone]  FOLLOW UP NOTE  Patient ID: Thomas [name], male    DOB: 03-16-1960  Age: 63 y.o. MRN: [id number] Date of Office Visit: 01/20/2024  Assessment  Chief Complaint: No chief complaint on file.  HPI Thomas [name] is a 63 year old male who presents to the clinic for a follow up visit. He was last seen in this clinic for evaluation of asthma COPD overlap syndrome, alpha gal syndrome, allergic rhinitis, eosinophillia, and recurrent infection.   His last environmental allergy skin testing was on 02/20/2021 and was positive to grass pollen, weed pollen, ragweed pollen, tree pollen, mold, dust mites, cat, dog, and cockroach.   Discussed the use of AI scribe software for clinical note transcription with the patient, who gave verbal consent to proceed.  History of Present Illness Thomas [name] is a 63 year old male who presents for a six-week follow-up regarding a new cream for a rash.  He has been using a new cream for a rash on his knees and elbow, which has improved the condition. The rash is less rough and rugged and has dried up. He reports a large spot on one knee and a smaller one on the other.  He experiences shortness of breath with activity, particularly when walking in places like [employer]. He starts out fine but then experiences shaking and difficulty breathing halfway through. He uses albuterol  as needed for shortness of breath, which helps if he is already short of breath but causes jitteriness if used preemptively. He continues to take montelukast  (Singulair ) and uses a 'breast treat' inhaler.  He has a persistent cough, especially in the mornings, which he attributes to sleeping. No recent wheezing or phlegm production. He describes a sensation of nasal obstruction, causing him to blow air out of his mouth. This has been occurring for the past month or two and happens frequently. He mentions having drainage  in the back of his throat consistently and denies any recent infections requiring antibiotics. He has not been avoiding any foods, including mammal meat, despite a previous mention of alpha-gal.  He reports difficulty sleeping, which he attributes to possible depression. He is currently on a trial basis with a new medication for this issue. He does not have a counselor and is not interested in pursuing one at this time.  He has a history of using CPAP for sleep apnea but reports it was ineffective. He has tried various CPAP devices, including nasal pads and full-face masks, but has not found them helpful.     Drug Allergies:  No Known Allergies  Physical Exam: There were no vitals taken for this visit.   Physical Exam  Diagnostics:    Assessment and Plan: No diagnosis found.  No orders of the defined types were placed in this encounter.   There are no Patient Instructions on file for this visit.  No follow-ups on file.    Thank you for the opportunity to care for this patient.  Please do not hesitate to contact me with questions.  Arlean Mutter, FNP [facility]

## 2024-01-19 NOTE — Patient Instructions (Incomplete)
 Asthma COPD syndrome Continue montelukast  10 mg once a day to prevent cough or wheeze Continue Breztri  2 puffs twice a day with a spacer to prevent cough or wheeze Continue albuterol  2 puffs once every 4 hours if needed for cough or wheeze You may use albuterol  2 puffs 5 to 15 minutes before activity to decrease cough or wheeze Continue Nucala  injections once every 28 days for asthma control  Allergic rhinitis Continue allergen avoidance measures directed toward pollen, mold, dust mite, cat, dog, and cockroach as listed below Continue cetirizine  10 mg once a day as needed for runny nose or itch Continue Flonase 2 sprays in each nostril once a day as needed for stuffy nose Consider saline nasal rinses as needed for nasal symptoms. Use this before any medicated nasal sprays for best result Consider allergen immunotherapy if your symptoms are not well-controlled with the treatment plan as listed above  Rash  Continue a twice a day moisturizing routine Continue betamethasone  twice a day as needed. Do not use this medication longer than 2 weeks in a row Continue with your appointment to dermatology for further evaluation. Call the clinic if your symptoms worsen  Eosinophilia Your eosinophil count was elevated on the last lab work on 10/02/2021. Let's recheck that lab to make sure the count is decreasing. We will call you when the results become available  Alpha gal allergy Continue to avoid mammalian meats.  In case of an allergic reaction, give Benadryl *** {Blank single:19197::teaspoonful,teaspoonfuls,capsules} every {blank single:19197::4,6} hours, and if life-threatening symptoms occur, inject with {Blank single:19197::EpiPen  0.3 mg,EpiPen  Jr. 0.15 mg,AuviQ 0.3 mg,AuviQ 0.15 mg,AuviQ 0.10 mg}. Let's recheck your alpha gal levels.  Call the clinic if this treatment plan is not working well for you  Follow up in 6 months or sooner if needed.  Reducing Pollen Exposure The  American Academy of Allergy, Asthma and Immunology suggests the following steps to reduce your exposure to pollen during allergy seasons. Do not hang sheets or clothing out to dry; pollen may collect on these items. Do not mow lawns or spend time around freshly cut grass; mowing stirs up pollen. Keep windows closed at night.  Keep car windows closed while driving. Minimize morning activities outdoors, a time when pollen counts are usually at their highest. Stay indoors as much as possible when pollen counts or humidity is high and on windy days when pollen tends to remain in the air longer. Use air conditioning when possible.  Many air conditioners have filters that trap the pollen spores. Use a HEPA room air filter to remove pollen form the indoor air you breathe.  Control of Mold Allergen Mold and fungi can grow on a variety of surfaces provided certain temperature and moisture conditions exist.  Outdoor molds grow on plants, decaying vegetation and soil.  The major outdoor mold, Alternaria and Cladosporium, are found in very high numbers during hot and dry conditions.  Generally, a late Summer - Fall peak is seen for common outdoor fungal spores.  Rain will temporarily lower outdoor mold spore count, but counts rise rapidly when the rainy period ends.  The most important indoor molds are Aspergillus and Penicillium.  Dark, humid and poorly ventilated basements are ideal sites for mold growth.  The next most common sites of mold growth are the bathroom and the kitchen.  Outdoor [employer] Use air conditioning and keep windows closed Avoid exposure to decaying vegetation. Avoid leaf raking. Avoid grain handling. Consider wearing a face mask if working in  moldy areas.  Indoor Mold Control Maintain humidity below 50%. Clean washable surfaces with 5% bleach solution. Remove sources e.g. Contaminated carpets.   Control of Dust Mite Allergen Dust mites play a major role in allergic asthma and  rhinitis. They occur in environments with high humidity wherever human skin is found. Dust mites absorb humidity from the atmosphere (ie, they do not drink) and feed on organic matter (including shed human and animal skin). Dust mites are a microscopic type of insect that you cannot see with the naked eye. High levels of dust mites have been detected from mattresses, pillows, carpets, upholstered furniture, bed covers, clothes, soft toys and any woven material. The principal allergen of the dust mite is found in its feces. A gram of dust may contain 1,000 mites and 250,000 fecal particles. Mite antigen is easily measured in the air during house cleaning activities. Dust mites do not bite and do not cause harm to humans, other than by triggering allergies/asthma.  Ways to decrease your exposure to dust mites in your home:  1. Encase mattresses, box springs and pillows with a mite-impermeable barrier or cover  2. Wash sheets, blankets and drapes weekly in hot water (130 F) with detergent and dry them in a dryer on the hot setting.  3. Have the room cleaned frequently with a vacuum cleaner and a damp dust-mop. For carpeting or rugs, vacuuming with a vacuum cleaner equipped with a high-efficiency particulate air (HEPA) filter. The dust mite allergic individual should not be in a room which is being cleaned and should wait 1 hour after cleaning before going into the room.  4. Do not sleep on upholstered furniture (eg, couches).  5. If possible removing carpeting, upholstered furniture and drapery from the home is ideal. Horizontal blinds should be eliminated in the rooms where the person spends the most time (bedroom, study, television room). Washable vinyl, roller-type shades are optimal.  6. Remove all non-washable stuffed toys from the bedroom. Wash stuffed toys weekly like sheets and blankets above.  7. Reduce indoor humidity to less than 50%. Inexpensive humidity monitors can be purchased at most  hardware stores. Do not use a humidifier as can make the problem worse and are not recommended.  Control of Dog or Cat Allergen Avoidance is the best way to manage a dog or cat allergy. If you have a dog or cat and are allergic to dog or cats, consider removing the dog or cat from the home. If you have a dog or cat but don't want to find it a new home, or if your family wants a pet even though someone in the household is allergic, here are some strategies that may help keep symptoms at bay:  Keep the pet out of your bedroom and restrict it to only a few rooms. Be advised that keeping the dog or cat in only one room will not limit the allergens to that room. Don't pet, hug or kiss the dog or cat; if you do, wash your hands with soap and water. High-efficiency particulate air (HEPA) cleaners run continuously in a bedroom or living room can reduce allergen levels over time. Regular use of a high-efficiency vacuum cleaner or a central vacuum can reduce allergen levels. Giving your dog or cat a bath at least once a week can reduce airborne allergen.   Control of Cockroach Allergen Cockroach allergen has been identified as an important cause of acute attacks of asthma, especially in urban settings.  There are fifty-five species  of cockroach that exist in the [place] , however only three, the American, German and Oriental species produce allergen that can affect patients with Asthma.  Allergens can be obtained from fecal particles, egg casings and secretions from cockroaches.    Remove food sources. Reduce access to water. Seal access and entry points. Spray runways with 0.5-1% Diazinon or Chlorpyrifos Blow boric acid power under stoves and refrigerator. Place bait stations (hydramethylnon) at feeding sites.

## 2024-01-20 ENCOUNTER — Ambulatory Visit: Admitting: Family Medicine

## 2024-01-20 VITALS — BP 150/90 | HR 87 | Resp 24 | Wt 312.4 lb

## 2024-01-20 DIAGNOSIS — R21 Rash and other nonspecific skin eruption: Secondary | ICD-10-CM

## 2024-01-20 DIAGNOSIS — D7219 Other eosinophilia: Secondary | ICD-10-CM | POA: Diagnosis not present

## 2024-01-20 DIAGNOSIS — J4550 Severe persistent asthma, uncomplicated: Secondary | ICD-10-CM | POA: Diagnosis not present

## 2024-01-20 DIAGNOSIS — J3089 Other allergic rhinitis: Secondary | ICD-10-CM

## 2024-01-20 DIAGNOSIS — J4489 Other specified chronic obstructive pulmonary disease: Secondary | ICD-10-CM

## 2024-01-20 DIAGNOSIS — J302 Other seasonal allergic rhinitis: Secondary | ICD-10-CM

## 2024-01-20 MED ORDER — VTAMA 1 % EX CREA: 1.0000 | TOPICAL_CREAM | Freq: Every day | CUTANEOUS | 3 refills | Status: AC

## 2024-01-21 ENCOUNTER — Encounter: Payer: Self-pay | Admitting: Family Medicine

## 2024-01-21 DIAGNOSIS — J302 Other seasonal allergic rhinitis: Secondary | ICD-10-CM | POA: Insufficient documentation

## 2024-01-21 DIAGNOSIS — R21 Rash and other nonspecific skin eruption: Secondary | ICD-10-CM | POA: Insufficient documentation

## 2024-01-21 DIAGNOSIS — J4550 Severe persistent asthma, uncomplicated: Secondary | ICD-10-CM | POA: Insufficient documentation

## 2024-01-21 DIAGNOSIS — D7219 Other eosinophilia: Secondary | ICD-10-CM | POA: Insufficient documentation

## 2024-07-27 ENCOUNTER — Ambulatory Visit: Admitting: Allergy & Immunology
